# Patient Record
Sex: Male | Born: 1960 | Race: White | Hispanic: No | Marital: Married | Smoking: Former smoker
Health system: Southern US, Community
[De-identification: ages and names within clinical notes are randomized; demographics above are authoritative.]

## PROBLEM LIST (undated history)

## (undated) DIAGNOSIS — K2960 Other gastritis without bleeding: Secondary | ICD-10-CM

## (undated) DIAGNOSIS — I5022 Chronic systolic (congestive) heart failure: Secondary | ICD-10-CM

## (undated) DIAGNOSIS — D649 Anemia, unspecified: Secondary | ICD-10-CM

## (undated) DIAGNOSIS — E785 Hyperlipidemia, unspecified: Secondary | ICD-10-CM

## (undated) DIAGNOSIS — K2980 Duodenitis without bleeding: Secondary | ICD-10-CM

## (undated) DIAGNOSIS — K269 Duodenal ulcer, unspecified as acute or chronic, without hemorrhage or perforation: Secondary | ICD-10-CM

## (undated) DIAGNOSIS — I255 Ischemic cardiomyopathy: Secondary | ICD-10-CM

## (undated) DIAGNOSIS — I10 Essential (primary) hypertension: Secondary | ICD-10-CM

## (undated) DIAGNOSIS — I2510 Atherosclerotic heart disease of native coronary artery without angina pectoris: Secondary | ICD-10-CM

## (undated) HISTORY — DX: Other gastritis without bleeding: K29.60

## (undated) HISTORY — PX: PILONIDAL CYST EXCISION: SHX744

## (undated) HISTORY — DX: Chronic systolic (congestive) heart failure: I50.22

## (undated) HISTORY — DX: Duodenitis without bleeding: K29.80

## (undated) HISTORY — DX: Duodenal ulcer, unspecified as acute or chronic, without hemorrhage or perforation: K26.9

## (undated) HISTORY — DX: Anemia, unspecified: D64.9

---

## 2001-07-04 ENCOUNTER — Emergency Department (HOSPITAL_COMMUNITY): Admission: EM | Admit: 2001-07-04 | Discharge: 2001-07-04 | Payer: Self-pay | Admitting: Emergency Medicine

## 2001-07-04 ENCOUNTER — Encounter: Payer: Self-pay | Admitting: Emergency Medicine

## 2008-08-15 ENCOUNTER — Emergency Department (HOSPITAL_COMMUNITY): Admission: EM | Admit: 2008-08-15 | Discharge: 2008-08-16 | Payer: Self-pay | Admitting: Emergency Medicine

## 2008-08-21 ENCOUNTER — Emergency Department (HOSPITAL_COMMUNITY): Admission: EM | Admit: 2008-08-21 | Discharge: 2008-08-22 | Payer: Self-pay | Admitting: Emergency Medicine

## 2010-04-18 ENCOUNTER — Emergency Department (HOSPITAL_COMMUNITY)
Admission: EM | Admit: 2010-04-18 | Discharge: 2010-04-19 | Disposition: A | Discharge status: Home / Self Care | Payer: Self-pay | Attending: Emergency Medicine | Admitting: Emergency Medicine

## 2010-04-18 DIAGNOSIS — R002 Palpitations: Secondary | ICD-10-CM | POA: Insufficient documentation

## 2010-04-18 DIAGNOSIS — I498 Other specified cardiac arrhythmias: Secondary | ICD-10-CM | POA: Insufficient documentation

## 2010-04-18 LAB — POCT I-STAT, CHEM 8
BUN: 12 mg/dL (ref 6–23)
Calcium, Ion: 0.99 mmol/L — ABNORMAL LOW (ref 1.12–1.32)
Chloride: 106 meq/L (ref 96–112)
Creatinine, Ser: 1.1 mg/dL (ref 0.4–1.5)
Glucose, Bld: 175 mg/dL — ABNORMAL HIGH (ref 70–99)
HCT: 53 % — ABNORMAL HIGH (ref 39.0–52.0)
Hemoglobin: 18 g/dL — ABNORMAL HIGH (ref 13.0–17.0)
Potassium: 3.9 meq/L (ref 3.5–5.1)
Sodium: 138 meq/L (ref 135–145)
TCO2: 21 mmol/L (ref 0–100)

## 2010-04-19 LAB — T4, FREE: Free T4: 1.15 ng/dL (ref 0.80–1.80)

## 2010-04-19 LAB — TSH: TSH: 1.038 u[IU]/mL (ref 0.350–4.500)

## 2012-01-25 ENCOUNTER — Encounter (HOSPITAL_COMMUNITY): Payer: Self-pay | Admitting: *Deleted

## 2012-01-25 ENCOUNTER — Emergency Department (HOSPITAL_COMMUNITY): Payer: Self-pay

## 2012-01-25 ENCOUNTER — Emergency Department (HOSPITAL_COMMUNITY)
Admission: EM | Admit: 2012-01-25 | Discharge: 2012-01-26 | Disposition: A | Discharge status: Home / Self Care | Payer: Self-pay | Attending: Emergency Medicine | Admitting: Emergency Medicine

## 2012-01-25 DIAGNOSIS — R110 Nausea: Secondary | ICD-10-CM | POA: Insufficient documentation

## 2012-01-25 DIAGNOSIS — F17200 Nicotine dependence, unspecified, uncomplicated: Secondary | ICD-10-CM | POA: Insufficient documentation

## 2012-01-25 DIAGNOSIS — I10 Essential (primary) hypertension: Secondary | ICD-10-CM | POA: Insufficient documentation

## 2012-01-25 DIAGNOSIS — R0602 Shortness of breath: Secondary | ICD-10-CM | POA: Insufficient documentation

## 2012-01-25 DIAGNOSIS — R000 Tachycardia, unspecified: Secondary | ICD-10-CM | POA: Insufficient documentation

## 2012-01-25 HISTORY — DX: Essential (primary) hypertension: I10

## 2012-01-25 LAB — CBC WITH DIFFERENTIAL/PLATELET
Basophils Relative: 0 % (ref 0–1)
Eosinophils Absolute: 0.2 10*3/uL (ref 0.0–0.7)
HCT: 43.5 % (ref 39.0–52.0)
Hemoglobin: 14.6 g/dL (ref 13.0–17.0)
MCH: 27 pg (ref 26.0–34.0)
MCHC: 33.6 g/dL (ref 30.0–36.0)
MCV: 80.4 fL (ref 78.0–100.0)
Monocytes Absolute: 0.7 10*3/uL (ref 0.1–1.0)
Monocytes Relative: 6 % (ref 3–12)
Neutro Abs: 6.1 10*3/uL (ref 1.7–7.7)

## 2012-01-25 LAB — COMPREHENSIVE METABOLIC PANEL
Albumin: 3.8 g/dL (ref 3.5–5.2)
BUN: 17 mg/dL (ref 6–23)
Chloride: 98 mEq/L (ref 96–112)
Creatinine, Ser: 0.77 mg/dL (ref 0.50–1.35)
Total Bilirubin: 0.2 mg/dL — ABNORMAL LOW (ref 0.3–1.2)

## 2012-01-25 LAB — TROPONIN I: Troponin I: <0.3 ng/mL (ref <0.30)

## 2012-01-25 NOTE — ED Provider Notes (Signed)
History     CSN: [id number]  Arrival date & time 01/25/12  2132   First MD Initiated Contact with Patient 01/25/12 2327      Chief Complaint  Patient presents with  . Weakness    (Consider location/radiation/quality/duration/timing/severity/associated sxs/prior treatment) HPI This is a 52 year old male with a two-day history of generalized weakness. He states he felt weak yesterday and took his blood pressure at the drugstore, finding it to be 176/110. He drank some over-the-counter "natural" medicine a friend recommended. He rechecked his blood pressure today and found it to be 198/127, and he felt worse. The weakness is accompanied by intermittent shortness of breath, palpitations (by which he means a rapid heartbeat) and nausea but no chest pain, vomiting or diarrhea. He is not aware of having a fever but has had some mild chills. He was noted to be tachycardic on arrival and nursing staff reports he was hyperventilating. His tachypnea and tachycardia have subsequently resolved. His most recent blood pressure is 146/97, heart rate in the 80s.  Past Medical History  Diagnosis Date  . Hypertension     History reviewed. No pertinent past surgical history.  No family history on file.  History  Substance Use Topics  . Smoking status: Current Some Day Smoker  . Smokeless tobacco: Not on file  . Alcohol Use: No      Review of Systems  All other systems reviewed and are negative.    Allergies  Review of patient's allergies indicates no known allergies.  Home Medications  No current outpatient prescriptions on file.  BP 147/96  Pulse 78  Temp 97.8 F (36.6 C) (Oral)  Resp 19  SpO2 96%  Physical Exam General: Well-developed, well-nourished male in no acute distress; appearance consistent with age of record HENT: normocephalic, atraumatic Eyes: pupils equal round and reactive to light; extraocular muscles intact Neck: supple Heart: regular rate and rhythm Lungs:  clear to auscultation bilaterally Abdomen: soft; nondistended; nontender; no masses or hepatosplenomegaly; bowel sounds present Extremities: No deformity; full range of motion; pulses normal; no edema; no calf tenderness Neurologic: Awake, alert and oriented; motor function intact in all extremities and symmetric; no facial droop Skin: Warm and dry Psychiatric: Anxious    ED Course  Procedures (including critical care time)     MDM   Nursing notes and vitals signs, including pulse oximetry, reviewed.  Summary of this visit's results, reviewed by myself:  Labs:  Results for orders placed during the hospital encounter of 01/25/12 (from the past 24 hour(s))  CBC WITH DIFFERENTIAL     Status: Abnormal   Collection Time   01/25/12  9:54 PM      Component Value Range   WBC 12.1 (*) 4.0 - 10.5 K/uL   RBC 5.41  4.22 - 5.81 MIL/uL   Hemoglobin 14.6  13.0 - 17.0 g/dL   HCT 78.2  95.6 - 21.3 %   MCV 80.4  78.0 - 100.0 fL   MCH 27.0  26.0 - 34.0 pg   MCHC 33.6  30.0 - 36.0 g/dL   RDW 08.6  57.8 - 46.9 %   Platelets 233  150 - 400 K/uL   Neutrophils Relative 51  43 - 77 %   Neutro Abs 6.1  1.7 - 7.7 K/uL   Lymphocytes Relative 42  12 - 46 %   Lymphs Abs 5.0 (*) 0.7 - 4.0 K/uL   Monocytes Relative 6  3 - 12 %   Monocytes Absolute 0.7  0.1 -  1.0 K/uL   Eosinophils Relative 2  0 - 5 %   Eosinophils Absolute 0.2  0.0 - 0.7 K/uL   Basophils Relative 0  0 - 1 %   Basophils Absolute 0.0  0.0 - 0.1 K/uL  COMPREHENSIVE METABOLIC PANEL     Status: Abnormal   Collection Time   01/25/12  9:54 PM      Component Value Range   Sodium 131 (*) 135 - 145 mEq/L   Potassium 3.8  3.5 - 5.1 mEq/L   Chloride 98  96 - 112 mEq/L   CO2 21  19 - 32 mEq/L   Glucose, Bld 145 (*) 70 - 99 mg/dL   BUN 17  6 - 23 mg/dL   Creatinine, Ser 9.60  0.50 - 1.35 mg/dL   Calcium 9.2  8.4 - 45.4 mg/dL   Total Protein 7.9  6.0 - 8.3 g/dL   Albumin 3.8  3.5 - 5.2 g/dL   AST 19  0 - 37 U/L   ALT 15  0 - 53 U/L    Alkaline Phosphatase 97  39 - 117 U/L   Total Bilirubin 0.2 (*) 0.3 - 1.2 mg/dL   GFR calc non Af Amer >90  >90 mL/min   GFR calc Af Amer >90  >90 mL/min  TROPONIN I     Status: Normal   Collection Time   01/25/12  9:54 PM      Component Value Range   Troponin I <0.30  <0.30 ng/mL  TROPONIN I     Status: Normal   Collection Time   01/26/12 12:37 AM      Component Value Range   Troponin I <0.30  <0.30 ng/mL  URINALYSIS, ROUTINE W REFLEX MICROSCOPIC     Status: Abnormal   Collection Time   01/26/12  1:20 AM      Component Value Range   Color, Urine YELLOW  YELLOW   APPearance CLEAR  CLEAR   Specific Gravity, Urine 1.020  1.005 - 1.030   pH 5.0  5.0 - 8.0   Glucose, UA NEGATIVE  NEGATIVE mg/dL   Hgb urine dipstick LARGE (*) NEGATIVE   Bilirubin Urine NEGATIVE  NEGATIVE   Ketones, ur NEGATIVE  NEGATIVE mg/dL   Protein, ur NEGATIVE  NEGATIVE mg/dL   Urobilinogen, UA 0.2  0.0 - 1.0 mg/dL   Nitrite NEGATIVE  NEGATIVE   Leukocytes, UA NEGATIVE  NEGATIVE  URINE MICROSCOPIC-ADD ON     Status: Normal   Collection Time   01/26/12  1:20 AM      Component Value Range   Squamous Epithelial / LPF RARE  RARE   WBC, UA 0-2  <3 WBC/hpf   RBC / HPF 0-2  <3 RBC/hpf    Imaging Studies: Dg Chest 2 View  01/26/2012  *RADIOLOGY REPORT*  Clinical Data: High blood pressure and heart palpitations. Weakness.  CHEST - 2 VIEW  Comparison: None.  Findings: The slightly shallow inspiration. The heart size and pulmonary vascularity are normal. The lungs appear clear and expanded without focal air space disease or consolidation. No blunting of the costophrenic angles.  No pneumothorax.  Mediastinal contours appear intact.  IMPRESSION: No evidence of active pulmonary disease.   Original Report Authenticated By: Burman Nieves, M.D.     EKG Interpretation:  Date & Time: 01/25/2012 9:35 PM  Rate: 123  Rhythm: sinus tachycardia  QRS Axis: left  Intervals: normal  ST/T Wave abnormalities: normal  Conduction  Disutrbances:none  Narrative Interpretation:  Old EKG Reviewed: Fusion complex seen previously  2:07 AM Patient feels better at this time. He admits that he has been eating a lot of salt lately. He also states he has been stressed at work recently. Start on hydrochlorothiazide. He was encouraged to establish himself with a primary care physician.          Hanley Seamen, MD 01/26/12 [phone]

## 2012-01-25 NOTE — ED Notes (Signed)
Pt presents hyperventilating; states his bp is elevated; states started with chest pain yesterday; shortness of breath; nausea; c/o headache; states drank some medicine to make his bp come down and felt worse afterwards; heart rate 130 in triage

## 2012-01-26 LAB — URINALYSIS, ROUTINE W REFLEX MICROSCOPIC
Bilirubin Urine: NEGATIVE
Ketones, ur: NEGATIVE mg/dL
Nitrite: NEGATIVE
Protein, ur: NEGATIVE mg/dL
Specific Gravity, Urine: 1.02 (ref 1.005–1.030)
Urobilinogen, UA: 0.2 mg/dL (ref 0.0–1.0)

## 2012-01-26 LAB — TROPONIN I: Troponin I: <0.3 ng/mL (ref <0.30)

## 2012-01-26 MED ORDER — HYDROCHLOROTHIAZIDE 25 MG PO TABS: 25.0000 mg | ORAL_TABLET | Freq: Every day | ORAL | Status: DC

## 2012-10-25 ENCOUNTER — Encounter (HOSPITAL_COMMUNITY): Payer: Self-pay | Admitting: Emergency Medicine

## 2012-10-25 ENCOUNTER — Emergency Department (INDEPENDENT_AMBULATORY_CARE_PROVIDER_SITE_OTHER)
Admission: EM | Admit: 2012-10-25 | Discharge: 2012-10-25 | Disposition: A | Discharge status: Home / Self Care | Payer: Self-pay | Source: Home / Self Care

## 2012-10-25 DIAGNOSIS — Z9119 Patient's noncompliance with other medical treatment and regimen: Secondary | ICD-10-CM

## 2012-10-25 DIAGNOSIS — I10 Essential (primary) hypertension: Secondary | ICD-10-CM

## 2012-10-25 DIAGNOSIS — Z91199 Patient's noncompliance with other medical treatment and regimen due to unspecified reason: Secondary | ICD-10-CM

## 2012-10-25 MED ORDER — HYDROCHLOROTHIAZIDE 25 MG PO TABS: 25.0000 mg | ORAL_TABLET | Freq: Every day | ORAL | Status: DC

## 2012-10-25 NOTE — ED Provider Notes (Signed)
CSN: [id number]     Arrival date & time 10/25/12  1148 History   First MD Initiated Contact with Patient 10/25/12 1242     Chief Complaint  Patient presents with  . Hypertension    hx of HBP.  has not taken meds in over a year. c/o headache.    (Consider location/radiation/quality/duration/timing/severity/associated sxs/prior Treatment) HPI Comments: 52 year old [place] man comes to the urgent care for refill on his antihypertensive medication. He was seen approximately one year ago at the emergency department and was treated with HCTZ. He was told to followup with a PCP but he states that he did not look for one because he was too busy.Marland Kitchen He does not know what the results of taking a blood pressure medicine was. When he ran out he started taking a friend's blood pressure medicine, metoprolol. He states when he stopped taking the medicine he feels tense and has a headache. While taking the medicine he feels better he does not know his blood pressure is.   Past Medical History  Diagnosis Date  . Hypertension    History reviewed. No pertinent past surgical history. History reviewed. No pertinent family history. History  Substance Use Topics  . Smoking status: Current Some Day Smoker  . Smokeless tobacco: Not on file  . Alcohol Use: No    Review of Systems  Constitutional: Negative.   HENT: Negative.   Respiratory: Positive for shortness of breath.   Cardiovascular: Negative.   Gastrointestinal: Negative.   Genitourinary: Negative.   Skin: Negative.   Neurological: Negative.     Allergies  Review of patient's allergies indicates no known allergies.  Home Medications   Current Outpatient Rx  Name  Route  Sig  Dispense  Refill  . hydrochlorothiazide (HYDRODIURIL) 25 MG tablet   Oral   Take 1 tablet (25 mg total) by mouth daily.   30 tablet   0   . hydrochlorothiazide (HYDRODIURIL) 25 MG tablet   Oral   Take 1 tablet (25 mg total) by mouth daily.   30 tablet    0    BP 168/98  Pulse 77  Temp(Src) 97.9 F (36.6 C) (Oral)  Resp 14  SpO2 100% Physical Exam  Nursing note and vitals reviewed. Constitutional: He is oriented to person, place, and time. He appears well-developed and well-nourished. No distress.  Eyes: Conjunctivae and EOM are normal. Pupils are equal, round, and reactive to light.  Neck: Normal range of motion.  Cardiovascular: Normal rate, regular rhythm, normal heart sounds and intact distal pulses.   No murmur heard. Pulmonary/Chest: Effort normal and breath sounds normal. No respiratory distress. He has no wheezes.  Musculoskeletal: He exhibits no edema.  Lymphadenopathy:    He has no cervical adenopathy.  Neurological: He is alert and oriented to person, place, and time. He exhibits normal muscle tone.  Skin: Skin is warm and dry.  Psychiatric: He has a normal mood and affect.    ED Course  Procedures (including critical care time) Labs Review Labs Reviewed - No data to display Imaging Review No results found.    MDM   1. HTN (hypertension)   2. Personal history of noncompliance with medical treatment      HCTZ 25 mg q d. Must follow with a PCP for chronic care and BP management. The Urgent care is not equipped to manage optimal chronic medical illnesses. Phone numbers given.   Hayden Rasmussen, NP 10/25/12 1402

## 2012-10-25 NOTE — ED Notes (Signed)
C/o headache. Hx of hypertension. Pt states that he has not taken med in over a year. Denies any visual changes.

## 2012-10-25 NOTE — ED Provider Notes (Signed)
Medical screening examination/treatment/procedure(s) were performed by non-physician practitioner and as supervising physician I was immediately available for consultation/collaboration.  Jakobee Brackins, M.D.  Zacari Stiff C Estelita Iten, MD 10/25/12 1734 

## 2012-11-29 ENCOUNTER — Encounter: Payer: Self-pay | Admitting: Internal Medicine

## 2012-11-29 ENCOUNTER — Ambulatory Visit: Payer: No Typology Code available for payment source | Attending: Internal Medicine | Admitting: Internal Medicine

## 2012-11-29 VITALS — BP 164/104 | HR 76 | Temp 98.1°F | Ht 67.0 in | Wt 199.2 lb

## 2012-11-29 DIAGNOSIS — I10 Essential (primary) hypertension: Secondary | ICD-10-CM | POA: Insufficient documentation

## 2012-11-29 LAB — CBC WITH DIFFERENTIAL/PLATELET
Basophils Absolute: 0 10*3/uL (ref 0.0–0.1)
Eosinophils Absolute: 0.1 10*3/uL (ref 0.0–0.7)
Lymphocytes Relative: 37 % (ref 12–46)
Lymphs Abs: 3 10*3/uL (ref 0.7–4.0)
Neutrophils Relative %: 55 % (ref 43–77)
Platelets: 240 10*3/uL (ref 150–400)
RBC: 5.47 MIL/uL (ref 4.22–5.81)
RDW: 14.2 % (ref 11.5–15.5)
WBC: 8 10*3/uL (ref 4.0–10.5)

## 2012-11-29 LAB — COMPLETE METABOLIC PANEL WITH GFR
BUN: 15 mg/dL (ref 6–23)
CO2: 30 mEq/L (ref 19–32)
Calcium: 9.4 mg/dL (ref 8.4–10.5)
Chloride: 101 mEq/L (ref 96–112)
Creat: 0.98 mg/dL (ref 0.50–1.35)
GFR, Est African American: >89 mL/min
Glucose, Bld: 115 mg/dL — ABNORMAL HIGH (ref 70–99)

## 2012-11-29 LAB — LIPID PANEL
HDL: 30 mg/dL — ABNORMAL LOW (ref >39)
Total CHOL/HDL Ratio: 7 Ratio
Triglycerides: 203 mg/dL — ABNORMAL HIGH (ref <150)

## 2012-11-29 MED ORDER — LISINOPRIL-HYDROCHLOROTHIAZIDE 20-25 MG PO TABS: 1.0000 | ORAL_TABLET | Freq: Every day | ORAL | Status: DC

## 2012-11-29 NOTE — Patient Instructions (Signed)

## 2012-11-29 NOTE — Progress Notes (Signed)
Patient ID: [name], male   DOB: October 19, 1960, 52 y.o.   MRN: [id number] Patient Demographics  Jason Foster, is a 52 y.o. male  [id number]  [id number]  DOB - [birth year]  CC:  Chief Complaint  Patient presents with  . Establish Care  . Hypertension       HPI: Jason Foster is a 53 y.o. male here today to establish medical care. Patient is known to have hypertension, started on hydrochlorothiazide 25 mg tablet daily, patient has been inconsistent with taking medication, he claims he takes it when he has headache. He has no other medical history. He is a Runner, broadcasting/film/video. He quit smoking last year after a long term smoking history, he does not drink alcohol. Patient has No headache, No chest pain, No abdominal pain - No Nausea, No new weakness tingling or numbness, No Cough - SOB.  No Known Allergies Past Medical History  Diagnosis Date  . Hypertension    Current Outpatient Prescriptions on File Prior to Visit  Medication Sig Dispense Refill  . hydrochlorothiazide (HYDRODIURIL) 25 MG tablet Take 1 tablet (25 mg total) by mouth daily.  30 tablet  0  . hydrochlorothiazide (HYDRODIURIL) 25 MG tablet Take 1 tablet (25 mg total) by mouth daily.  30 tablet  0   No current facility-administered medications on file prior to visit.   No family history on file. History   Social History  . Marital Status: Single    Spouse Name: N/A    Number of Children: N/A  . Years of Education: N/A   Occupational History  . Not on file.   Social History Main Topics  . Smoking status: Current Some Day Smoker  . Smokeless tobacco: Former Neurosurgeon    Quit date: 11/30/2011  . Alcohol Use: No  . Drug Use: No  . Sexual Activity: Yes   Other Topics Concern  . Not on file   Social History Narrative  . No narrative on file    Review of Systems: Constitutional: Negative for fever, chills, diaphoresis, activity change, appetite change and fatigue. HENT: Negative for ear pain, nosebleeds,  congestion, facial swelling, rhinorrhea, neck pain, neck stiffness and ear discharge.  Eyes: Negative for pain, discharge, redness, itching and visual disturbance. Respiratory: Negative for cough, choking, chest tightness, shortness of breath, wheezing and stridor.  Cardiovascular: Negative for chest pain, palpitations and leg swelling. Gastrointestinal: Negative for abdominal distention. Genitourinary: Negative for dysuria, urgency, frequency, hematuria, flank pain, decreased urine volume, difficulty urinating and dyspareunia.  Musculoskeletal: Negative for back pain, joint swelling, arthralgia and gait problem. Neurological: Negative for dizziness, tremors, seizures, syncope, facial asymmetry, speech difficulty, weakness, light-headedness, numbness and headaches.  Hematological: Negative for adenopathy. Does not bruise/bleed easily. Psychiatric/Behavioral: Negative for hallucinations, behavioral problems, confusion, dysphoric mood, decreased concentration and agitation.    Objective:   Filed Vitals:   11/29/12 1246  BP: 164/104  Pulse: 76  Temp: 98.1 F (36.7 C)    Physical Exam: Constitutional: Patient appears well-developed and well-nourished. No distress. HENT: Normocephalic, atraumatic, External right and left ear normal. Oropharynx is clear and moist.  Eyes: Conjunctivae and EOM are normal. PERRLA, no scleral icterus. Neck: Normal ROM. Neck supple. No JVD. No tracheal deviation. No thyromegaly. CVS: RRR, S1/S2 +, no murmurs, no gallops, no carotid bruit.  Pulmonary: Effort and breath sounds normal, no stridor, rhonchi, wheezes, rales.  Abdominal: Soft. BS +, no distension, tenderness, rebound or guarding.  Musculoskeletal: Normal range of motion. No edema and no tenderness.  Lymphadenopathy:  No lymphadenopathy noted, cervical, inguinal or axillary Neuro: Alert. Normal reflexes, muscle tone coordination. No cranial nerve deficit. Skin: Skin is warm and dry. No rash noted. Not  diaphoretic. No erythema. No pallor. Psychiatric: Normal mood and affect. Behavior, judgment, thought content normal.  Lab Results  Component Value Date   WBC 12.1* 01/25/2012   HGB 14.6 01/25/2012   HCT 43.5 01/25/2012   MCV 80.4 01/25/2012   PLT 233 01/25/2012   Lab Results  Component Value Date   CREATININE 0.77 01/25/2012   BUN 17 01/25/2012   NA 131* 01/25/2012   K 3.8 01/25/2012   CL 98 01/25/2012   CO2 21 01/25/2012    No results found for this basename: HGBA1C   Lipid Panel  No results found for this basename: chol, trig, hdl, cholhdl, vldl, ldlcalc       Assessment and plan:   Patient Active Problem List   Diagnosis Date Noted  . Essential hypertension, benign 11/29/2012    Plan: Lisinopril-hydrochlorothiazide 20-25 mg tablet by mouth daily DASH diet  Patient counseled extensively about nutrition and exercise Patient counseled extensively about smoking cessation  Lab:  Comprehensive metabolic panel  Complete blood count and differentials  Thyroid function test  Lipid panel  Complete urinalysis     Follow up in 4 weeks or when necessary  The patient was given clear instructions to go to ER or return to medical center if symptoms don't improve, worsen or new problems develop. The patient verbalized understanding. The patient was told to call to get lab results if they haven't heard anything in the next week.     Jeanann Lewandowsky, MD, MHA, FACP, FAAP [facility] [place], [place] [phone]   11/29/2012, 1:04 PM

## 2012-11-29 NOTE — Progress Notes (Signed)
Pt is here to establish care. Pt has hypertension and requests testing for cholesterol and glucose reading.

## 2012-11-30 LAB — URINALYSIS, COMPLETE
Bacteria, UA: NONE SEEN
Crystals: NONE SEEN
Ketones, ur: NEGATIVE mg/dL
Nitrite: NEGATIVE
Protein, ur: NEGATIVE mg/dL
Specific Gravity, Urine: 1.022 (ref 1.005–1.030)
Urobilinogen, UA: 0.2 mg/dL (ref 0.0–1.0)

## 2012-11-30 LAB — TSH: TSH: 0.652 u[IU]/mL (ref 0.350–4.500)

## 2012-12-29 ENCOUNTER — Ambulatory Visit: Payer: Self-pay

## 2012-12-30 ENCOUNTER — Encounter: Payer: Self-pay | Admitting: Internal Medicine

## 2012-12-30 ENCOUNTER — Ambulatory Visit: Payer: No Typology Code available for payment source | Attending: Internal Medicine | Admitting: Internal Medicine

## 2012-12-30 VITALS — BP 137/86 | HR 81 | Temp 98.8°F | Resp 16 | Ht 67.0 in | Wt 207.0 lb

## 2012-12-30 DIAGNOSIS — I10 Essential (primary) hypertension: Secondary | ICD-10-CM | POA: Insufficient documentation

## 2012-12-30 DIAGNOSIS — E785 Hyperlipidemia, unspecified: Secondary | ICD-10-CM

## 2012-12-30 MED ORDER — ATORVASTATIN CALCIUM 20 MG PO TABS: 20.0000 mg | ORAL_TABLET | Freq: Every day | ORAL | Status: DC

## 2012-12-30 NOTE — Progress Notes (Signed)
Patient ID: Jason Foster, male   DOB: 1960-12-13, 52 y.o.   MRN: [id number] Patient Demographics  Jason Foster, is a 52 y.o. male  [id number]  [id number]  DOB - Nov 27, 1960  Chief Complaint  Patient presents with  . Follow-up        Subjective:   Jason Foster is a 52 y.o. male here today for a follow up visit. Patient wants to review the lab results for cholesterol. He was recently started on lisinopril-hydrochlorothiazide for hypertension. Blood pressure is controlled he is tolerating medication well. No new complaints today. He continue to smoke cigarette but at a reduced rate. Patient has No headache, No chest pain, No abdominal pain - No Nausea, No new weakness tingling or numbness, No Cough - SOB.  ALLERGIES: No Known Allergies  PAST MEDICAL HISTORY: Past Medical History  Diagnosis Date  . Hypertension     MEDICATIONS AT HOME: Prior to Admission medications   Medication Sig Start Date End Date Taking? Authorizing Provider  lisinopril-hydrochlorothiazide (PRINZIDE,ZESTORETIC) 20-25 MG per tablet Take 1 tablet by mouth daily. 11/29/12  Yes Jeanann Lewandowsky, MD  atorvastatin (LIPITOR) 20 MG tablet Take 1 tablet (20 mg total) by mouth daily. 12/30/12   Jeanann Lewandowsky, MD     Objective:   Filed Vitals:   12/30/12 1232  BP: 137/86  Pulse: 81  Temp: 98.8 F (37.1 C)  TempSrc: Oral  Resp: 16  Height: 5\' 7"  (1.702 m)  Weight: 207 lb (93.895 kg)  SpO2: 97%    Exam General appearance : Awake, alert, not in any distress. Speech Clear. Not toxic looking HEENT: Atraumatic and Normocephalic, pupils equally reactive to light and accomodation Neck: supple, no JVD. No cervical lymphadenopathy.  Chest:Good air entry bilaterally, no added sounds  CVS: S1 S2 regular, no murmurs.  Abdomen: Bowel sounds present, Non tender and not distended with no gaurding, rigidity or rebound. Extremities: B/L Lower Ext shows no edema, both legs are warm to touch Neurology:  Awake alert, and oriented X 3, CN II-XII intact, Non focal Skin:No Rash Wounds:N/A   Data Review   CBC No results found for this basename: WBC, HGB, HCT, PLT, MCV, MCH, MCHC, RDW, NEUTRABS, LYMPHSABS, MONOABS, EOSABS, BASOSABS, BANDABS, BANDSABD,  in the last 168 hours  Chemistries   No results found for this basename: NA, K, CL, CO2, GLUCOSE, BUN, CREATININE, GFRCGP, CALCIUM, MG, AST, ALT, ALKPHOS, BILITOT,  in the last 168 hours ------------------------------------------------------------------------------------------------------------------ No results found for this basename: HGBA1C,  in the last 72 hours ------------------------------------------------------------------------------------------------------------------ No results found for this basename: CHOL, HDL, LDLCALC, TRIG, CHOLHDL, LDLDIRECT,  in the last 72 hours ------------------------------------------------------------------------------------------------------------------ No results found for this basename: TSH, T4TOTAL, FREET3, T3FREE, THYROIDAB,  in the last 72 hours ------------------------------------------------------------------------------------------------------------------ No results found for this basename: VITAMINB12, FOLATE, FERRITIN, TIBC, IRON, RETICCTPCT,  in the last 72 hours  Coagulation profile  No results found for this basename: INR, PROTIME,  in the last 168 hours    Assessment & Plan   1. Essential hypertension, benign Continue lisinopril-hydrochlorothiazide 20-25 mg tablet by mouth daily  2. Dyslipidemia Start - atorvastatin (LIPITOR) 20 MG tablet; Take 1 tablet (20 mg total) by mouth daily.  Dispense: 90 tablet; Refill: 3 Repeat labs in 3 months  Patient has been extensively counseled on nutrition and exercise Patient counseled on smoking cessation  Follow up in 3 months or when necessary   The patient was given clear instructions to go to ER or return to medical center if symptoms  don't  improve, worsen or new problems develop. The patient verbalized understanding. The patient was told to call to get lab results if they haven't heard anything in the next week.    Jeanann Lewandowsky, MD, MHA, FACP, FAAP [facility] [place], [place] [phone]   12/30/2012, 12:55 PM

## 2012-12-30 NOTE — Progress Notes (Signed)
Pt is here following up on his HJTN. Pt is requesting to review his lab result.

## 2012-12-30 NOTE — Patient Instructions (Addendum)
Hypertension As your heart beats, it forces blood through your arteries. This force is your blood pressure. If the pressure is too high, it is called hypertension (HTN) or high blood pressure. HTN is dangerous because you may have it and not know it. High blood pressure may mean that your heart has to work harder to pump blood. Your arteries may be narrow or stiff. The extra work puts you at risk for heart disease, stroke, and other problems.  Blood pressure consists of two numbers, a higher number over a lower, 110/72, for example. It is stated as "110 over 72." The ideal is below 120 for the top number (systolic) and under 80 for the bottom (diastolic). Write down your blood pressure today. You should pay close attention to your blood pressure if you have certain conditions such as:  Heart failure.  Prior heart attack.  Diabetes  Chronic kidney disease.  Prior stroke.  Multiple risk factors for heart disease. To see if you have HTN, your blood pressure should be measured while you are seated with your arm held at the level of the heart. It should be measured at least twice. A one-time elevated blood pressure reading (especially in the Emergency Department) does not mean that you need treatment. There may be conditions in which the blood pressure is different between your right and left arms. It is important to see your caregiver soon for a recheck. Most people have essential hypertension which means that there is not a specific cause. This type of high blood pressure may be lowered by changing lifestyle factors such as:  Stress.  Smoking.  Lack of exercise.  Excessive weight.  Drug/tobacco/alcohol use.  Eating less salt. Most people do not have symptoms from high blood pressure until it has caused damage to the body. Effective treatment can often prevent, delay or reduce that damage. TREATMENT  When a cause has been identified, treatment for high blood pressure is directed at the  cause. There are a large number of medications to treat HTN. These fall into several categories, and your caregiver will help you select the medicines that are best for you. Medications may have side effects. You should review side effects with your caregiver. If your blood pressure stays high after you have made lifestyle changes or started on medicines,   Your medication(s) may need to be changed.  Other problems may need to be addressed.  Be certain you understand your prescriptions, and know how and when to take your medicine.  Be sure to follow up with your caregiver within the time frame advised (usually within two weeks) to have your blood pressure rechecked and to review your medications.  If you are taking more than one medicine to lower your blood pressure, make sure you know how and at what times they should be taken. Taking two medicines at the same time can result in blood pressure that is too low. SEEK IMMEDIATE MEDICAL CARE IF:  You develop a severe headache, blurred or changing vision, or confusion.  You have unusual weakness or numbness, or a faint feeling.  You have severe chest or abdominal pain, vomiting, or breathing problems. MAKE SURE YOU:   Understand these instructions.  Will watch your condition.  Will get help right away if you are not doing well or get worse. Document Released: 12/30/2004 Document Revised: 03/24/2011 Document Reviewed: 08/20/2007 [facility] Patient Information 2014 [place]. DASH Diet The DASH diet stands for "Dietary Approaches to Stop Hypertension." It is a healthy  eating plan that has been shown to reduce high blood pressure (hypertension) in as little as 14 days, while also possibly providing other significant health benefits. These other health benefits include reducing the risk of breast cancer after menopause and reducing the risk of type 2 diabetes, heart disease, colon cancer, and stroke. Health benefits also include weight loss  and slowing kidney failure in patients with chronic kidney disease.  DIET GUIDELINES  Limit salt (sodium). Your diet should contain less than 1500 mg of sodium daily.  Limit refined or processed carbohydrates. Your diet should include mostly whole grains. Desserts and added sugars should be used sparingly.  Include small amounts of heart-healthy fats. These types of fats include nuts, oils, and tub margarine. Limit saturated and trans fats. These fats have been shown to be harmful in the body. CHOOSING FOODS  The following food groups are based on a 2000 calorie diet. See your Registered Dietitian for individual calorie needs. Grains and Grain Products (6 to 8 servings daily)  Eat More Often: Whole-wheat bread, brown rice, whole-grain or wheat pasta, quinoa, popcorn without added fat or salt (air popped).  Eat Less Often: White bread, white pasta, white rice, cornbread. Vegetables (4 to 5 servings daily)  Eat More Often: Fresh, frozen, and canned vegetables. Vegetables may be raw, steamed, roasted, or grilled with a minimal amount of fat.  Eat Less Often/Avoid: Creamed or fried vegetables. Vegetables in a cheese sauce. Fruit (4 to 5 servings daily)  Eat More Often: All fresh, canned (in natural juice), or frozen fruits. Dried fruits without added sugar. One hundred percent fruit juice ( cup [237 mL] daily).  Eat Less Often: Dried fruits with added sugar. Canned fruit in light or heavy syrup. [employer], Fish, and Poultry (2 servings or less daily. One serving is 3 to 4 oz [85-114 g]).  Eat More Often: Ninety percent or leaner ground beef, tenderloin, sirloin. Round cuts of beef, chicken breast, [place] breast. All fish. Grill, bake, or broil your meat. Nothing should be fried.  Eat Less Often/Avoid: Fatty cuts of meat, [place], or chicken leg, thigh, or wing. Fried cuts of meat or fish. Dairy (2 to 3 servings)  Eat More Often: Low-fat or fat-free milk, low-fat plain or light yogurt,  reduced-fat or part-skim cheese.  Eat Less Often/Avoid: Milk (whole, 2%).Whole milk yogurt. Full-fat cheeses. Nuts, Seeds, and Legumes (4 to 5 servings per week)  Eat More Often: All without added salt.  Eat Less Often/Avoid: Salted nuts and seeds, canned beans with added salt. Fats and Sweets (limited)  Eat More Often: Vegetable oils, tub margarines without trans fats, sugar-free gelatin. Mayonnaise and salad dressings.  Eat Less Often/Avoid: Coconut oils, palm oils, butter, stick margarine, cream, half and half, cookies, candy, pie. FOR MORE INFORMATION The Dash Diet Eating Plan: www.dashdiet.org Document Released: 12/19/2010 Document Revised: 03/24/2011 Document Reviewed: 12/19/2010 [facility] Patient Information 2014 [place], [place]. Atorvastatin tablets What is this medicine? ATORVASTATIN (a TORE va sta tin) is known as a HMG-CoA reductase inhibitor or 'statin'. It lowers the level of cholesterol and triglycerides in the blood. This drug may also reduce the risk of heart attack, stroke, or other health problems in patients with risk factors for heart disease. Diet and lifestyle changes are often used with this drug. This medicine may be used for other purposes; ask your health care provider or pharmacist if you have questions. COMMON BRAND NAME(S): Lipitor What should I tell my health care provider before I take this medicine? They need  to know if you have any of these conditions: -frequently drink alcoholic beverages -history of stroke, TIA -kidney disease -liver disease -muscle aches or weakness -other medical condition -an unusual or allergic reaction to atorvastatin, other medicines, foods, dyes, or preservatives -pregnant or trying to get pregnant -breast-feeding How should I use this medicine? Take this medicine by mouth with a glass of water. Follow the directions on the prescription label. You can take this medicine with or without food. Take your doses at regular  intervals. Do not take your medicine more often than directed. Talk to your pediatrician regarding the use of this medicine in children. While this drug may be prescribed for children as young as 42 years old for selected conditions, precautions do apply. Overdosage: If you think you have taken too much of this medicine contact a poison control center or emergency room at once. NOTE: This medicine is only for you. Do not share this medicine with others. What if I miss a dose? If you miss a dose, take it as soon as you can. If it is almost time for your next dose, take only that dose. Do not take double or extra doses. What may interact with this medicine? Do not take this medicine with any of the following medications: -red yeast rice -telaprevir -telithromycin -voriconazole This medicine may also interact with the following medications: -alcohol -antiviral medicines for HIV or AIDS -boceprevir -certain antibiotics like clarithromycin, erythromycin, troleandomycin -certain medicines for cholesterol like fenofibrate or gemfibrozil -cimetidine -clarithromycin -colchicine -cyclosporine -digoxin -male hormones, like estrogens or progestins and birth control pills -grapefruit juice -medicines for fungal infections like fluconazole, itraconazole, ketoconazole -niacin -rifampin -spironolactone This list may not describe all possible interactions. Give your health care provider a list of all the medicines, herbs, non-prescription drugs, or dietary supplements you use. Also tell them if you smoke, drink alcohol, or use illegal drugs. Some items may interact with your medicine. What should I watch for while using this medicine? Visit your doctor or health care professional for regular check-ups. You may need regular tests to make sure your liver is working properly. Tell your doctor or health care professional right away if you get any unexplained muscle pain, tenderness, or weakness,  especially if you also have a fever and tiredness. Your doctor or health care professional may tell you to stop taking this medicine if you develop muscle problems. If your muscle problems do not go away after stopping this medicine, contact your health care professional. This drug is only part of a total heart-health program. Your doctor or a dietician can suggest a low-cholesterol and low-fat diet to help. Avoid alcohol and smoking, and keep a proper exercise schedule. Do not use this drug if you are pregnant or breast-feeding. Serious side effects to an unborn child or to an infant are possible. Talk to your doctor or pharmacist for more information. This medicine may affect blood sugar levels. If you have diabetes, check with your doctor or health care professional before you change your diet or the dose of your diabetic medicine. If you are going to have surgery tell your health care professional that you are taking this drug. What side effects may I notice from receiving this medicine? Side effects that you should report to your doctor or health care professional as soon as possible: -allergic reactions like skin rash, itching or hives, swelling of the face, lips, or tongue -dark urine -fever -joint pain -muscle cramps, pain -redness, blistering, peeling or loosening of the  skin, including inside the mouth -trouble passing urine or change in the amount of urine -unusually weak or tired -yellowing of eyes or skin Side effects that usually do not require medical attention (report to your doctor or health care professional if they continue or are bothersome): -constipation -heartburn -stomach gas, pain, upset This list may not describe all possible side effects. Call your doctor for medical advice about side effects. You may report side effects to FDA at 1-800-FDA-1088. Where should I keep my medicine? Keep out of the reach of children. Store at room temperature between 20 to 25 degrees C  (68 to 77 degrees F). Throw away any unused medicine after the expiration date. NOTE: This sheet is a summary. It may not cover all possible information. If you have questions about this medicine, talk to your doctor, pharmacist, or health care provider.  2014, Elsevier/Gold Standard. (2010-11-19 16:10:96)

## 2013-01-13 HISTORY — PX: CORONARY ANGIOPLASTY WITH STENT PLACEMENT: SHX49

## 2013-02-10 ENCOUNTER — Inpatient Hospital Stay (HOSPITAL_COMMUNITY)
Admission: EM | Admit: 2013-02-10 | Discharge: 2013-02-12 | DRG: 247 | Disposition: A | Discharge status: Home / Self Care | Payer: No Typology Code available for payment source | Attending: Cardiology | Admitting: Cardiology

## 2013-02-10 ENCOUNTER — Encounter (HOSPITAL_COMMUNITY): Payer: Self-pay | Admitting: Emergency Medicine

## 2013-02-10 ENCOUNTER — Encounter (HOSPITAL_COMMUNITY)
Admission: EM | Disposition: A | Discharge status: Home / Self Care | Payer: No Typology Code available for payment source | Source: Home / Self Care | Attending: Cardiology

## 2013-02-10 DIAGNOSIS — I219 Acute myocardial infarction, unspecified: Secondary | ICD-10-CM

## 2013-02-10 DIAGNOSIS — I255 Ischemic cardiomyopathy: Secondary | ICD-10-CM

## 2013-02-10 DIAGNOSIS — I2510 Atherosclerotic heart disease of native coronary artery without angina pectoris: Secondary | ICD-10-CM

## 2013-02-10 DIAGNOSIS — I213 ST elevation (STEMI) myocardial infarction of unspecified site: Secondary | ICD-10-CM

## 2013-02-10 DIAGNOSIS — I2109 ST elevation (STEMI) myocardial infarction involving other coronary artery of anterior wall: Principal | ICD-10-CM

## 2013-02-10 DIAGNOSIS — Z955 Presence of coronary angioplasty implant and graft: Secondary | ICD-10-CM

## 2013-02-10 DIAGNOSIS — I10 Essential (primary) hypertension: Secondary | ICD-10-CM

## 2013-02-10 DIAGNOSIS — E785 Hyperlipidemia, unspecified: Secondary | ICD-10-CM

## 2013-02-10 DIAGNOSIS — F17200 Nicotine dependence, unspecified, uncomplicated: Secondary | ICD-10-CM | POA: Diagnosis present

## 2013-02-10 DIAGNOSIS — I2589 Other forms of chronic ischemic heart disease: Secondary | ICD-10-CM | POA: Diagnosis present

## 2013-02-10 DIAGNOSIS — I2582 Chronic total occlusion of coronary artery: Secondary | ICD-10-CM | POA: Diagnosis present

## 2013-02-10 DIAGNOSIS — I517 Cardiomegaly: Secondary | ICD-10-CM

## 2013-02-10 DIAGNOSIS — Z7901 Long term (current) use of anticoagulants: Secondary | ICD-10-CM

## 2013-02-10 DIAGNOSIS — Z7902 Long term (current) use of antithrombotics/antiplatelets: Secondary | ICD-10-CM

## 2013-02-10 HISTORY — PX: PERCUTANEOUS CORONARY STENT INTERVENTION (PCI-S): SHX5485

## 2013-02-10 HISTORY — DX: Ischemic cardiomyopathy: I25.5

## 2013-02-10 HISTORY — PX: CORONARY ANGIOGRAM: SHX5466

## 2013-02-10 HISTORY — DX: Hyperlipidemia, unspecified: E78.5

## 2013-02-10 HISTORY — DX: Atherosclerotic heart disease of native coronary artery without angina pectoris: I25.10

## 2013-02-10 LAB — POCT I-STAT, CHEM 8
BUN: 18 mg/dL (ref 6–23)
CHLORIDE: 104 meq/L (ref 96–112)
Calcium, Ion: 1.14 mmol/L (ref 1.12–1.23)
Creatinine, Ser: 0.9 mg/dL (ref 0.50–1.35)
Glucose, Bld: 167 mg/dL — ABNORMAL HIGH (ref 70–99)
HEMATOCRIT: 44 % (ref 39.0–52.0)
Hemoglobin: 15 g/dL (ref 13.0–17.0)
Potassium: 3.8 mEq/L (ref 3.7–5.3)
Sodium: 138 mEq/L (ref 137–147)
TCO2: 19 mmol/L (ref 0–100)

## 2013-02-10 LAB — MRSA PCR SCREENING: MRSA by PCR: NEGATIVE

## 2013-02-10 LAB — CBC
HCT: 46 % (ref 39.0–52.0)
HEMATOCRIT: 40.5 % (ref 39.0–52.0)
HEMOGLOBIN: 15.3 g/dL (ref 13.0–17.0)
Hemoglobin: 13.8 g/dL (ref 13.0–17.0)
MCH: 26.4 pg (ref 26.0–34.0)
MCH: 26.8 pg (ref 26.0–34.0)
MCHC: 33.3 g/dL (ref 30.0–36.0)
MCHC: 34.1 g/dL (ref 30.0–36.0)
MCV: 79.4 fL (ref 78.0–100.0)
MCV: 78.8 fL (ref 78.0–100.0)
PLATELETS: 206 10*3/uL (ref 150–400)
Platelets: 251 10*3/uL (ref 150–400)
RBC: 5.79 MIL/uL (ref 4.22–5.81)
RBC: 5.14 MIL/uL (ref 4.22–5.81)
RDW: 14.7 % (ref 11.5–15.5)
RDW: 14.5 % (ref 11.5–15.5)
WBC: 15.7 10*3/uL — ABNORMAL HIGH (ref 4.0–10.5)
WBC: 13.1 10*3/uL — AB (ref 4.0–10.5)

## 2013-02-10 LAB — HEMOGLOBIN A1C
Hgb A1c MFr Bld: 6.1 % — ABNORMAL HIGH (ref <5.7)
MEAN PLASMA GLUCOSE: 128 mg/dL — AB (ref <117)

## 2013-02-10 LAB — COMPREHENSIVE METABOLIC PANEL
ALK PHOS: 95 U/L (ref 39–117)
ALT: 26 U/L (ref 0–53)
AST: 50 U/L — ABNORMAL HIGH (ref 0–37)
Albumin: 3.9 g/dL (ref 3.5–5.2)
BUN: 19 mg/dL (ref 6–23)
CO2: 21 meq/L (ref 19–32)
Calcium: 8.9 mg/dL (ref 8.4–10.5)
Chloride: 96 mEq/L (ref 96–112)
Creatinine, Ser: 0.91 mg/dL (ref 0.50–1.35)
GLUCOSE: 137 mg/dL — AB (ref 70–99)
POTASSIUM: 6.9 meq/L — AB (ref 3.7–5.3)
Sodium: 132 mEq/L — ABNORMAL LOW (ref 137–147)
Total Bilirubin: 0.3 mg/dL (ref 0.3–1.2)
Total Protein: 8.9 g/dL — ABNORMAL HIGH (ref 6.0–8.3)

## 2013-02-10 LAB — BASIC METABOLIC PANEL
BUN: 15 mg/dL (ref 6–23)
CO2: 23 meq/L (ref 19–32)
Calcium: 8.4 mg/dL (ref 8.4–10.5)
Chloride: 99 mEq/L (ref 96–112)
Creatinine, Ser: 0.88 mg/dL (ref 0.50–1.35)
GFR calc Af Amer: >90 mL/min (ref >90)
GLUCOSE: 126 mg/dL — AB (ref 70–99)
POTASSIUM: 4.6 meq/L (ref 3.7–5.3)
SODIUM: 137 meq/L (ref 137–147)

## 2013-02-10 LAB — POCT I-STAT TROPONIN I: Troponin i, poc: 0.07 ng/mL (ref 0.00–0.08)

## 2013-02-10 LAB — APTT: aPTT: 28 seconds (ref 24–37)

## 2013-02-10 LAB — POCT ACTIVATED CLOTTING TIME: ACTIVATED CLOTTING TIME: 498 s

## 2013-02-10 LAB — TROPONIN I

## 2013-02-10 LAB — PROTIME-INR
INR: 1.15 (ref 0.00–1.49)
PROTHROMBIN TIME: 14.5 s (ref 11.6–15.2)

## 2013-02-10 SURGERY — CORONARY ANGIOGRAM

## 2013-02-10 MED ORDER — HEPARIN SODIUM (PORCINE) 1000 UNIT/ML IJ SOLN
INTRAMUSCULAR | Status: AC
  Filled 2013-02-10: qty 1

## 2013-02-10 MED ORDER — HEPARIN SODIUM (PORCINE) 5000 UNIT/ML IJ SOLN
4000.0000 [IU] | INTRAMUSCULAR | Status: AC
  Administered 2013-02-10: 4000 [IU] via INTRAVENOUS

## 2013-02-10 MED ORDER — PERFLUTREN LIPID MICROSPHERE
1.0000 mL | INTRAVENOUS | Status: AC | PRN
  Administered 2013-02-10: 2 mL via INTRAVENOUS

## 2013-02-10 MED ORDER — ASPIRIN 81 MG PO CHEW
81.0000 mg | CHEWABLE_TABLET | Freq: Every day | ORAL | Status: DC
Start: 2013-02-10 — End: 2013-02-12
  Administered 2013-02-10 – 2013-02-12 (×3): 81 mg via ORAL
  Filled 2013-02-10 (×3): qty 1

## 2013-02-10 MED ORDER — NITROGLYCERIN 0.4 MG SL SUBL
SUBLINGUAL_TABLET | SUBLINGUAL | Status: AC
  Filled 2013-02-10: qty 25

## 2013-02-10 MED ORDER — ATORVASTATIN CALCIUM 80 MG PO TABS
80.0000 mg | ORAL_TABLET | Freq: Every day | ORAL | Status: DC
  Administered 2013-02-10 – 2013-02-11 (×2): 80 mg via ORAL
  Filled 2013-02-10 (×3): qty 1

## 2013-02-10 MED ORDER — PERFLUTREN LIPID MICROSPHERE
INTRAVENOUS | Status: AC
  Filled 2013-02-10: qty 10

## 2013-02-10 MED ORDER — BIVALIRUDIN 250 MG IV SOLR
INTRAVENOUS | Status: AC
  Filled 2013-02-10: qty 250

## 2013-02-10 MED ORDER — METOPROLOL TARTRATE 25 MG PO TABS
25.0000 mg | ORAL_TABLET | Freq: Two times a day (BID) | ORAL | Status: DC
  Administered 2013-02-10 – 2013-02-12 (×5): 25 mg via ORAL
  Filled 2013-02-10 (×6): qty 1

## 2013-02-10 MED ORDER — NITROGLYCERIN IN D5W 200-5 MCG/ML-% IV SOLN
5.0000 ug/min | INTRAVENOUS | Status: DC
  Administered 2013-02-10: 5 ug/min via INTRAVENOUS
  Filled 2013-02-10: qty 250

## 2013-02-10 MED ORDER — MIDAZOLAM HCL 2 MG/2ML IJ SOLN
INTRAMUSCULAR | Status: AC
Start: 2013-02-10 — End: 2013-02-10
  Filled 2013-02-10: qty 2

## 2013-02-10 MED ORDER — ACETAMINOPHEN 325 MG PO TABS: 650.0000 mg | ORAL_TABLET | ORAL | Status: DC | PRN

## 2013-02-10 MED ORDER — TICAGRELOR 90 MG PO TABS
ORAL_TABLET | ORAL | Status: AC
  Filled 2013-02-10: qty 1

## 2013-02-10 MED ORDER — ONDANSETRON HCL 4 MG/2ML IJ SOLN: 4.0000 mg | Freq: Four times a day (QID) | INTRAMUSCULAR | Status: DC | PRN

## 2013-02-10 MED ORDER — TICAGRELOR 90 MG PO TABS
90.0000 mg | ORAL_TABLET | Freq: Two times a day (BID) | ORAL | Status: DC
  Administered 2013-02-10 – 2013-02-12 (×5): 90 mg via ORAL
  Filled 2013-02-10 (×6): qty 1

## 2013-02-10 MED ORDER — SODIUM CHLORIDE 0.9 % IJ SOLN
3.0000 mL | INTRAMUSCULAR | Status: DC | PRN
  Administered 2013-02-10: 3 mL via INTRAVENOUS

## 2013-02-10 MED ORDER — SODIUM CHLORIDE 0.9 % IV SOLN
INTRAVENOUS | Status: DC
  Administered 2013-02-10: 20 mL/h via INTRAVENOUS

## 2013-02-10 MED ORDER — SODIUM CHLORIDE 0.9 % IV SOLN
1.0000 mL/kg/h | INTRAVENOUS | Status: AC
  Administered 2013-02-10: 1 mL/kg/h via INTRAVENOUS

## 2013-02-10 MED ORDER — LIDOCAINE HCL (PF) 1 % IJ SOLN
INTRAMUSCULAR | Status: AC
  Filled 2013-02-10: qty 30

## 2013-02-10 MED ORDER — OXYCODONE-ACETAMINOPHEN 5-325 MG PO TABS
1.0000 | ORAL_TABLET | ORAL | Status: DC | PRN
  Administered 2013-02-10: 1 via ORAL
  Filled 2013-02-10: qty 2
  Filled 2013-02-10: qty 1
  Filled 2013-02-10: qty 2

## 2013-02-10 MED ORDER — VERAPAMIL HCL 2.5 MG/ML IV SOLN
INTRAVENOUS | Status: AC
  Filled 2013-02-10: qty 2

## 2013-02-10 MED ORDER — ASPIRIN 81 MG PO CHEW
CHEWABLE_TABLET | ORAL | Status: AC
  Filled 2013-02-10: qty 4

## 2013-02-10 MED ORDER — NITROGLYCERIN 0.4 MG SL SUBL
0.4000 mg | SUBLINGUAL_TABLET | SUBLINGUAL | Status: DC | PRN
  Administered 2013-02-10 (×2): 0.4 mg via SUBLINGUAL

## 2013-02-10 MED ORDER — METOPROLOL TARTRATE 1 MG/ML IV SOLN
INTRAVENOUS | Status: AC
  Filled 2013-02-10: qty 10

## 2013-02-10 MED ORDER — HEPARIN (PORCINE) IN NACL 2-0.9 UNIT/ML-% IJ SOLN
INTRAMUSCULAR | Status: AC
  Filled 2013-02-10: qty 1000

## 2013-02-10 MED ORDER — METOPROLOL TARTRATE 1 MG/ML IV SOLN
5.0000 mg | Freq: Once | INTRAVENOUS | Status: AC
  Administered 2013-02-10: 5 mg via INTRAVENOUS

## 2013-02-10 MED ORDER — TICAGRELOR 90 MG PO TABS
ORAL_TABLET | ORAL | Status: AC
  Administered 2013-02-10: 90 mg via ORAL
  Filled 2013-02-10: qty 1

## 2013-02-10 MED ORDER — LISINOPRIL 10 MG PO TABS
10.0000 mg | ORAL_TABLET | Freq: Every day | ORAL | Status: DC
  Administered 2013-02-10 – 2013-02-12 (×3): 10 mg via ORAL
  Filled 2013-02-10 (×3): qty 1

## 2013-02-10 MED ORDER — NITROGLYCERIN 0.2 MG/ML ON CALL CATH LAB
INTRAVENOUS | Status: AC
  Filled 2013-02-10: qty 1

## 2013-02-10 MED ORDER — ASPIRIN 81 MG PO CHEW
324.0000 mg | CHEWABLE_TABLET | Freq: Once | ORAL | Status: AC
  Administered 2013-02-10: 324 mg via ORAL

## 2013-02-10 MED ORDER — FENTANYL CITRATE 0.05 MG/ML IJ SOLN
INTRAMUSCULAR | Status: AC
  Filled 2013-02-10: qty 2

## 2013-02-10 MED ORDER — MORPHINE SULFATE 2 MG/ML IJ SOLN: 2.0000 mg | INTRAMUSCULAR | Status: DC | PRN

## 2013-02-10 MED ORDER — HEPARIN SODIUM (PORCINE) 5000 UNIT/ML IJ SOLN
5000.0000 [IU] | Freq: Three times a day (TID) | INTRAMUSCULAR | Status: DC
Start: 2013-02-10 — End: 2013-02-12
  Administered 2013-02-10 – 2013-02-12 (×6): 5000 [IU] via SUBCUTANEOUS
  Filled 2013-02-10 (×9): qty 1

## 2013-02-10 NOTE — ED Provider Notes (Signed)
CSN: [id number]     Arrival date & time 02/10/13  0144 History   First MD Initiated Contact with Patient 02/10/13 0153     Chief Complaint  Patient presents with  . Chest Pain   (Consider location/radiation/quality/duration/timing/severity/associated sxs/prior Treatment) HPI 53 yo male presents to the ER from home with complaint of chest pain.  Pt reports central/left sided chest pain waking him from sleep.  Pain is burning/pressure.  Associated with sob, diaphoresis.  He reports over the last week he has had similar intermittent pain with exertion, cold.  No prior h/o same.  H/o HTN, hyperlipidemia.  No missed medications.  Does not take aspirin on regular basis.  Past Medical History  Diagnosis Date  . Hypertension    History reviewed. No pertinent past surgical history. No family history on file. History  Substance Use Topics  . Smoking status: Current Some Day Smoker  . Smokeless tobacco: Former NeurosurgeonUser    Quit date: 11/30/2011  . Alcohol Use: No    Review of Systems  All other systems reviewed and are negative.    Allergies  Review of patient's allergies indicates no known allergies.  Home Medications   Current Outpatient Rx  Name  Route  Sig  Dispense  Refill  . atorvastatin (LIPITOR) 20 MG tablet   Oral   Take 1 tablet (20 mg total) by mouth daily.   90 tablet   3   . lisinopril-hydrochlorothiazide (PRINZIDE,ZESTORETIC) 20-25 MG per tablet   Oral   Take 1 tablet by mouth daily.   90 tablet   3    Pulse 97  Wt 207 lb (93.895 kg) Physical Exam  Nursing note and vitals reviewed. Constitutional: He is oriented to person, place, and time. He appears well-developed and well-nourished. He appears distressed.  HENT:  Head: Normocephalic and atraumatic.  Right Ear: External ear normal.  Left Ear: External ear normal.  Nose: Nose normal.  Mouth/Throat: Oropharynx is clear and moist.  Eyes: Conjunctivae and EOM are normal. Pupils are equal, round, and reactive to  light.  Neck: Normal range of motion. Neck supple. No JVD present. No tracheal deviation present. No thyromegaly present.  Cardiovascular: Normal rate, regular rhythm, normal heart sounds and intact distal pulses.  Exam reveals no gallop and no friction rub.   No murmur heard. Pulmonary/Chest: Effort normal and breath sounds normal. No stridor. No respiratory distress. He has no wheezes. He has no rales. He exhibits no tenderness.  Abdominal: Soft. Bowel sounds are normal. He exhibits no distension and no mass. There is no tenderness. There is no rebound and no guarding.  Musculoskeletal: Normal range of motion. He exhibits no edema and no tenderness.  Lymphadenopathy:    He has no cervical adenopathy.  Neurological: He is alert and oriented to person, place, and time. He exhibits normal muscle tone. Coordination normal.  Skin: Skin is warm. No rash noted. He is diaphoretic. No erythema. No pallor.  Psychiatric: He has a normal mood and affect. His behavior is normal. Judgment and thought content normal.    ED Course  Procedures (including critical care time)  CRITICAL CARE Performed by: Olivia MackieTTER,Shavona Gunderman M Total critical care time: 30 min Critical care time was exclusive of separately billable procedures and treating other patients. Critical care was necessary to treat or prevent imminent or life-threatening deterioration. Critical care was time spent personally by me on the following activities: development of treatment plan with patient and/or surrogate as well as nursing, discussions with consultants, evaluation of  patient's response to treatment, examination of patient, obtaining history from patient or surrogate, ordering and performing treatments and interventions, ordering and review of laboratory studies, ordering and review of radiographic studies, pulse oximetry and re-evaluation of patient's condition.  Labs Review Labs Reviewed  POCT I-STAT, CHEM 8 - Abnormal; Notable for the  following:    Sodium 136 (*)    Potassium 7.1 (*)    BUN 27 (*)    Glucose, Bld 134 (*)    Calcium, Ion 1.01 (*)    All other components within normal limits  APTT  CBC  COMPREHENSIVE METABOLIC PANEL  PROTIME-INR   Imaging Review No results found.  EKG Interpretation    Date/Time:  Thursday February 10 2013 01:46:03 EST Ventricular Rate:  93 PR Interval:  164 QRS Duration: 93 QT Interval:  357 QTC Calculation: 444 R Axis:   -4 Text Interpretation:  Sinus rhythm Probable anteroseptal infarct, recent Lateral leads are also involved ** ** ACUTE MI / STEMI ** ** Confirmed by Sagan Wurzel  MD, Jumaane Weatherford ([id number]) on 02/10/2013 2:11:02 AM            MDM   1. STEMI (ST elevation myocardial infarction)    53 yo male with acute chest pain, ST elevations in V1,V2 with st depressions in inferior leads.  Code STEMI initiated, pt given asa, heparin, lopressor, NTG.  D/w Dr [place].  Initial istat with K of 7.1, suspect hemolyzed sample, CMET pending.  No peaked Twaves noted on EKG.        Olivia Mackie, MD 02/10/13 [phone]

## 2013-02-10 NOTE — CV Procedure (Signed)
    Cardiac Catheterization Procedure Note  Name: Jason Foster MRN: [id number] DOB: 1960-04-13  Procedure:  Selective Coronary Angiography, PTCA and stenting of the proximal to mid LAD  Indication: 53 yo male with history of HTN, hyperlipidemia, and tobacco use presents with an anterior STEMI.  Procedural Details:  The right wrist was prepped, draped, and anesthetized with 1% lidocaine. Using the modified Seldinger technique, a 6 French sheath was introduced into the right radial artery. 3 mg of verapamil was administered through the sheath, weight-based unfractionated heparin was administered intravenously. Standard Judkins catheters were used for selective coronary angiography. Due to tortuosity of the innominate artery catheter manipulations were difficult and we did not cross the AV. Catheter exchanges were performed over an exchange length guidewire.  PROCEDURAL FINDINGS Hemodynamics: AO 131/89 mean 110 mm Hg LV N/A   Coronary angiography: Coronary dominance: right  Left mainstem: 20% at the very distal left main.  Left anterior descending (LAD): 100% proximal.   Ramus intermediate: moderate to large in size. Normal.   Left circumflex (LCx): The LCx gives off 3 OM branches before terminating in the AV groove. Mild irregularities less than 20%.  Right coronary artery (RCA): 100% occlusion in the mid vessel. Left to right collaterals to the distal vessel.   Left ventriculography: Not doen  PCI Note:  Following the diagnostic procedure, the decision was made to proceed with PCI of the culprit LAD. Brilinta 180 mg was given orally. Weight-based bivalirudin was given for anticoagulation. Once a therapeutic ACT was achieved, a 6 [place] XBLAD guide catheter was inserted. Guide backup was limited. A prowater coronary guidewire was used to cross the lesion.  The lesion was predilated with a 2.5 mm then 3.0 mm balloon. With reperfusion there was noted to be extensive severe disease  throughout the proximal to mid vessel.The lesion was then stented with a 3.0 x 38 mm Promus stent in the mid vessel. A second 3.5 x 32 mm Promus stent was placed in the proximal LAD in an overlapping fashion. The stent was postdilated with a 3.5 mm noncompliant balloon.  Following PCI, there was 0% residual stenosis and TIMI-3 flow. Final angiography confirmed an excellent result. The patient tolerated the procedure well. There were no immediate procedural complications. A TR band was used for radial hemostasis. The patient was transferred to the post catheterization recovery area for further monitoring.  PCI Data: Vessel - LAD/Segment - proximal Percent Stenosis (pre)  100% TIMI-flow 0 Stent 3.0 x 38 mm Promus, 3.5 x 32 mm Promus Percent Stenosis (post) 0% TIMI-flow (post) 3  Final Conclusions:   1. Severe 2 vessel obstructive CAD 2. Successful stenting of the proximal to mid LAD with overlapping DES.     Recommendations:  Continue dual antiplatelet therapy indefinitely. Aggressive risk factor modification. Will obtain an Echo to assess LV function. Would treat RCA disease medically. If he has refractory angina could consider CTO intervention in the future.  Theron Arista [facility] 02/10/2013, 3:59 AM

## 2013-02-10 NOTE — Progress Notes (Signed)
Chaplain responded to code stemi.  Met family in [facility] waiting room.  Sister-in-law of patient present, with daughter of patient.  Wife and son of patient at home. Spiritual conversation over 45 minutes..comfort measures declined. Assisted family to meet with patient after procedure, then walked them to ED exit, giving instructions for best parking when returning to hospital.  Rev. [place], [place]

## 2013-02-10 NOTE — H&P (Signed)
Admission History and Physical     Patient ID: Jason Foster, MRN: [id number], DOB: 07/01/1960 53 y.o. Date of Encounter: 02/10/2013, 4:39 AM  Primary Physician: Jeanann LewandowskyJEGEDE, OLUGBEMIGA, MD Primary Cardiologist: None  Chief Complaint: Chest pain - STEMI  History of Present Illness: Jason Foster is a 53 y.o. male with a history of HTN and Hyperlipidemia, who presents after waking up from sleep with 10/10 crushing chest pain radiating to neck and arms.  He states that starting about 2 weeks ago, he noticed chest pain when he exerted himself.  He has done less activity over the past two weeks because of this.  He was pain free when he went to sleep last night, but woke up with severe CP, and [facility] where EKG showed anterior ST-elevation.  He was started on heparin gtt and aspirin, and [facility].  He is a former smoker, quit 1 year go.  No EtOH.  Accompanied today with wife and daughter.   Of note, initial i-stat K was 7.1, on i-stat in cath lab, K was 3.7.     Past Medical History  Diagnosis Date  . Hypertension      History reviewed. No pertinent past surgical history.   No current facility-administered medications on file prior to encounter.   Current Outpatient Prescriptions on File Prior to Encounter  Medication Sig Dispense Refill  . atorvastatin (LIPITOR) 20 MG tablet Take 1 tablet (20 mg total) by mouth daily.  90 tablet  3  . lisinopril-hydrochlorothiazide (PRINZIDE,ZESTORETIC) 20-25 MG per tablet Take 1 tablet by mouth daily.  90 tablet  3     Current Facility-Administered Medications  Medication Dose Route Frequency Provider Last Rate Last Dose  . 0.9 %  sodium chloride infusion   Intravenous Continuous Olivia Mackielga M Otter, MD   20 mL/hr at 02/10/13 0207  . 0.9 %  sodium chloride infusion  1 mL/kg/hr Intravenous Continuous Peter M SwazilandJordan, MD 93.9 mL/hr at 02/10/13 0438 1 mL/kg/hr at 02/10/13 0438  . acetaminophen (TYLENOL) tablet 650  mg  650 mg Oral Q4H PRN Peter M [place]az[place]Jordan, MD      . aspirin 81 MG chewable tablet           . aspirin chewable tablet 81 mg  81 mg Oral Daily Peter M [place]az[place]Jordan, MD      . atorvastatin (LIPITOR) tablet 80 mg  80 mg Oral q1800 Peter M [place]azilandJordan, MD      . heparin injection 5,000 Units  5,000 Units Subcutaneous Q8H Peter M [place]azilandJordan, MD      . lisinopril (PRINIVIL,ZESTRIL) tablet 10 mg  10 mg Oral Daily Peter M [place]Jordan, MD      . metoprolol (LOPRESSOR) 1 MG/ML injection           . metoprolol tartrate (LOPRESSOR) tablet 25 mg  25 mg Oral BID Peter M [place]Jordan, MD      . morphine 2 MG/ML injection 2 mg  2 mg Intravenous Q1H PRN Peter M [place]Jordan, MD      . nitroGLYCERIN (NITROSTAT) 0.4 MG SL tablet           . nitroGLYCERIN (NITROSTAT) SL tablet 0.4 mg  0.4 mg Sublingual Q5 min PRN Olivia Mackielga M Otter, MD   0.4 mg at 02/10/13 0159  . nitroGLYCERIN 0.2 mg/mL in dextrose 5 % infusion  5 mcg/min Intravenous Titrated Olivia Mackielga M Otter, MD 4.5 mL/hr at 02/10/13 0223 15 mcg/min at 02/10/13 0223  .  ondansetron (ZOFRAN) injection 4 mg  4 mg Intravenous Q6H PRN Peter M [place], MD      . oxyCODONE-acetaminophen (PERCOCET/ROXICET) 5-325 MG per tablet 1-2 tablet  1-2 tablet Oral Q4H PRN Peter M [place], MD      . Ticagrelor [facility]) tablet 90 mg  90 mg Oral BID Peter M [place], MD          Allergies: No Known Allergies   Social History:  The patient  reports that he has been smoking.  He quit smokeless tobacco use about 14 months ago. He reports that he does not drink alcohol or use illicit drugs.   Family History:  The patient's family history is not on file.   ROS:  Please see the history of present illness.     All other systems reviewed and negative.   Vital Signs: Blood pressure 128/94, pulse 76, temperature 98.1 F (36.7 C), temperature source Oral, resp. rate 22, height 5\' 7"  (1.702 m), weight 91.8 kg (202 lb 6.1 oz), SpO2 98.00%.  PHYSICAL EXAM: General:  Well nourished, well developed, in no acute distress (Post  PCI) HEENT: normal Lymph: no adenopathy Neck: no JVD Endocrine:  No thryomegaly Vascular: No carotid bruits; FA pulses 2+ bilaterally without bruits Cardiac:  normal S1, S2; RRR; no murmur Lungs:  clear to auscultation bilaterally, no wheezing, rhonchi or rales Abd: soft, nontender, no hepatomegaly Ext: no edema Musculoskeletal:  No deformities, BUE and BLE strength normal and equal Skin: warm and dry Neuro:  CNs 2-12 intact, no focal abnormalities noted Psych:  Normal affect   EKG:  NSR, ST-Elevation V2-v4.  Labs:   Lab Results  Component Value Date   WBC 15.7* 02/10/2013   HGB 17.0 02/10/2013   HCT 50.0 02/10/2013   MCV 79.4 02/10/2013   PLT 251 02/10/2013     Recent Labs Lab 02/10/13 0200 02/10/13 0207  NA 132* 136*  K 6.9* 7.1*  CL 96 104  CO2 21  --   BUN 19 27*  CREATININE 0.91 1.10  CALCIUM 8.9  --   PROT 8.9*  --   BILITOT 0.3  --   ALKPHOS 95  --   ALT 26  --   AST 50*  --   GLUCOSE 137* 134*   No results found for this basename: CKTOTAL, CKMB, TROPONINI,  in the last 72 hours Lab Results  Component Value Date   CHOL 211* 11/29/2012   HDL 30* 11/29/2012   LDLCALC 140* 11/29/2012   TRIG 203* 11/29/2012   No results found for this basename: DDIMER   No results found for this basename: HGBA1C     Coronary Angiography:  Left mainstem: 20% at the very distal left main.  Left anterior descending (LAD): 100% proximal.  Ramus intermediate: moderate to large in size. Normal.  Left circumflex (LCx): The LCx gives off 3 OM branches before terminating in the AV groove. Mild irregularities less than 20%.  Right coronary artery (RCA): 100% occlusion in the mid vessel. Left to right collaterals to the distal vessel.  Left ventriculography: Not done    ASSESSMENT AND PLAN:   1. 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   -  Long prox LAD lesion, required overlapping drug-eluting stents.   - Ticagralor and ASA started, plan for life-long DAPT as tolerated. - metoprolol and lisinopril started - atorvastatin 80mg  - Check HbA1C -  TTE in AM.  2.  Elevated Potassium on initial i-stat - normalized on cath lab i-stat, likely hemolyzed - formal labs this morning.  3. HTN - BB, ACE-i as above.  Signed,  Yaakov Guthrie, MD 02/10/2013, 4:39 AM

## 2013-02-10 NOTE — Care Management Note (Signed)
    Page 1 of 1   02/10/2013     10:16:00 AM   CARE MANAGEMENT NOTE 02/10/2013  Patient:  Jason Foster, Jason Foster   Account Number:  [id number]  Date Initiated:  02/10/2013  Documentation initiated by:  Junius Creamer  Subjective/Objective Assessment:   adm w stemi     Action/Plan:   lives w wife, pcp dr Val Eagle jegede   Anticipated DC Date:     Anticipated DC Plan:        DC Planning Services  CM consult  Medication Assistance      Choice offered to / List presented to:             Status of service:   Medicare Important Message given?   (If response is "NO", the following Medicare IM given date fields will be blank) Date Medicare IM given:   Date Additional Medicare IM given:    Discharge Disposition:    Per UR Regulation:  Reviewed for med. necessity/level of care/duration of stay  If discussed at Long Length of Stay Meetings, dates discussed:    Comments:  1/29 1014a debbie Brayen Bunn rn,bsn spoke w pt. he [facility] [place]. placed brilinta pt assist form on chart for md. gave pt 30day free brilinta card.

## 2013-02-10 NOTE — ED Notes (Signed)
Pt c/o central chest pain, non radiating, awoke from sleep 30 min ago. Denies diaphoresis, denies n/v/d

## 2013-02-10 NOTE — Progress Notes (Signed)
Echocardiogram 2D Echocardiogram with Definity has been performed.  Jason Foster 02/10/2013, 9:26 AM

## 2013-02-10 NOTE — Progress Notes (Signed)
DAILY PROGRESS NOTE  Subjective:  No events overnight. Presented with anterior STEMI and had a long-area of DES placed (3.0 x 38 mm Promus Premier / overlapping 3.5 x 32 mm Promus Element). Echo was performed today which shows EF 35-40%, expected akinesis of the mid-distal anteroseptal myocardium and apex. No further chest pain. TR band still in place.  Objective:  Temp:  [98.1 F (36.7 C)-98.6 F (37 C)] 98.6 F (37 C) (01/29 0800) Pulse Rate:  [72-97] 77 (01/29 1000) Resp:  [14-25] 18 (01/29 1000) BP: (110-182)/(70-133) 131/83 mmHg (01/29 1000) SpO2:  [95 %-100 %] 97 % (01/29 1000) Weight:  [202 lb 6.1 oz (91.8 kg)-207 lb (93.895 kg)] 202 lb 6.1 oz (91.8 kg) (01/29 0400) Weight change:   Intake/Output from previous day: 01/28 0701 - 01/29 0700 In: 293.8 [I.V.:293.8] Out: 800 [Urine:800]  Intake/Output from this shift: Total I/O In: 415.2 [P.O.:120; I.V.:295.2] Out: 250 [Urine:250]  Medications: Current Facility-Administered Medications  Medication Dose Route Frequency Provider Last Rate Last Dose  . 0.9 %  sodium chloride infusion   Intravenous Continuous Kalman Drape, MD   20 mL/hr at 02/10/13 0207  . 0.9 %  sodium chloride infusion  1 mL/kg/hr Intravenous Continuous Peter M Martinique, MD 93.9 mL/hr at 02/10/13 0700 1 mL/kg/hr at 02/10/13 0700  . acetaminophen (TYLENOL) tablet 650 mg  650 mg Oral Q4H PRN Peter M Martinique, MD      . aspirin 81 MG chewable tablet           . aspirin chewable tablet 81 mg  81 mg Oral Daily Peter M Martinique, MD   81 mg at 02/10/13 0941  . atorvastatin (LIPITOR) tablet 80 mg  80 mg Oral q1800 Peter M Martinique, MD      . heparin injection 5,000 Units  5,000 Units Subcutaneous Q8H Peter M Martinique, MD      . lisinopril (PRINIVIL,ZESTRIL) tablet 10 mg  10 mg Oral Daily Peter M Martinique, MD   10 mg at 02/10/13 4239  . metoprolol (LOPRESSOR) 1 MG/ML injection           . metoprolol tartrate (LOPRESSOR) tablet 25 mg  25 mg Oral BID Peter M Martinique, MD   25 mg  at 02/10/13 0941  . morphine 2 MG/ML injection 2 mg  2 mg Intravenous Q1H PRN Peter M Martinique, MD      . nitroGLYCERIN (NITROSTAT) SL tablet 0.4 mg  0.4 mg Sublingual Q5 min PRN Kalman Drape, MD   0.4 mg at 02/10/13 0159  . nitroGLYCERIN 0.2 mg/mL in dextrose 5 % infusion  5 mcg/min Intravenous Titrated Kalman Drape, MD 4.5 mL/hr at 02/10/13 0700 15 mcg/min at 02/10/13 0700  . ondansetron (ZOFRAN) injection 4 mg  4 mg Intravenous Q6H PRN Peter M Martinique, MD      . oxyCODONE-acetaminophen (PERCOCET/ROXICET) 5-325 MG per tablet 1-2 tablet  1-2 tablet Oral Q4H PRN Peter M Martinique, MD      . PERFLUTREN LIPID MICROSPHERE injection SUSP           . Ticagrelor (BRILINTA) tablet 90 mg  90 mg Oral BID Peter M Martinique, MD   90 mg at 02/10/13 0940    Physical Exam: General appearance: alert and no distress Neck: no carotid bruit and no JVD Lungs: clear to auscultation bilaterally Heart: regular rate and rhythm, S1, S2 normal, no murmur, click, rub or gallop Abdomen: soft, non-tender; bowel sounds normal; no masses,  no organomegaly Extremities: extremities normal,  atraumatic, no cyanosis or edema and TR band in place Pulses: 2+ and symmetric Skin: Skin color, texture, turgor normal. No rashes or lesions Neurologic: Grossly normal Psych: Pleasant, normal mood  Lab Results: Results for orders placed during the hospital encounter of 02/10/13 (from the past 48 hour(s))  APTT     Status: None   Collection Time    02/10/13  2:00 AM      Result Value Range   aPTT 28  24 - 37 seconds  CBC     Status: Abnormal   Collection Time    02/10/13  2:00 AM      Result Value Range   WBC 15.7 (*) 4.0 - 10.5 K/uL   RBC 5.79  4.22 - 5.81 MIL/uL   Hemoglobin 15.3  13.0 - 17.0 g/dL   HCT 46.0  39.0 - 52.0 %   MCV 79.4  78.0 - 100.0 fL   MCH 26.4  26.0 - 34.0 pg   MCHC 33.3  30.0 - 36.0 g/dL   RDW 14.7  11.5 - 15.5 %   Platelets 251  150 - 400 K/uL  COMPREHENSIVE METABOLIC PANEL     Status: Abnormal   Collection  Time    02/10/13  2:00 AM      Result Value Range   Sodium 132 (*) 137 - 147 mEq/L   Potassium 6.9 (*) 3.7 - 5.3 mEq/L   Comment: MODERATE HEMOLYSIS     CRITICAL RESULT CALLED TO, READ BACK BY AND VERIFIED WITH:     SPOKE WITH SHELL,A RN [phone] HAMER,N   Chloride 96  96 - 112 mEq/L   CO2 21  19 - 32 mEq/L   Glucose, Bld 137 (*) 70 - 99 mg/dL   BUN 19  6 - 23 mg/dL   Creatinine, Ser 0.91  0.50 - 1.35 mg/dL   Calcium 8.9  8.4 - 10.5 mg/dL   Total Protein 8.9 (*) 6.0 - 8.3 g/dL   Albumin 3.9  3.5 - 5.2 g/dL   AST 50 (*) 0 - 37 U/L   Comment: MODERATE HEMOLYSIS   ALT 26  0 - 53 U/L   Comment: MODERATE HEMOLYSIS   Alkaline Phosphatase 95  39 - 117 U/L   Comment: MODERATE HEMOLYSIS   Total Bilirubin 0.3  0.3 - 1.2 mg/dL   GFR calc non Af Amer >90  >90 mL/min   GFR calc Af Amer >90  >90 mL/min   Comment: (NOTE)     The eGFR has been calculated using the CKD EPI equation.     This calculation has not been validated in all clinical situations.     eGFR's persistently <90 mL/min signify possible Chronic Kidney     Disease.  PROTIME-INR     Status: None   Collection Time    02/10/13  2:00 AM      Result Value Range   Prothrombin Time 14.5  11.6 - 15.2 seconds   INR 1.15  0.00 - 1.49  POCT I-STAT TROPONIN I     Status: None   Collection Time    02/10/13  2:06 AM      Result Value Range   Troponin i, poc 0.07  0.00 - 0.08 ng/mL   Comment 3            Comment: Due to the release kinetics of cTnI,     a negative result within the first hours     of the onset of symptoms does not  rule out     myocardial infarction with certainty.     If myocardial infarction is still suspected,     repeat the test at appropriate intervals.  POCT I-STAT, CHEM 8     Status: Abnormal   Collection Time    02/10/13  2:07 AM      Result Value Range   Sodium 136 (*) 137 - 147 mEq/L   Potassium 7.1 (*) 3.7 - 5.3 mEq/L   Chloride 104  96 - 112 mEq/L   BUN 27 (*) 6 - 23 mg/dL   Creatinine, Ser 1.10   0.50 - 1.35 mg/dL   Glucose, Bld 134 (*) 70 - 99 mg/dL   Calcium, Ion 1.01 (*) 1.12 - 1.23 mmol/L   TCO2 25  0 - 100 mmol/L   Hemoglobin 17.0  13.0 - 17.0 g/dL   HCT 50.0  39.0 - 52.0 %   Comment NOTIFIED PHYSICIAN    POCT ACTIVATED CLOTTING TIME     Status: None   Collection Time    02/10/13  2:56 AM      Result Value Range   Activated Clotting Time 498    MRSA PCR SCREENING     Status: None   Collection Time    02/10/13  4:18 AM      Result Value Range   MRSA by PCR NEGATIVE  NEGATIVE   Comment:            The GeneXpert MRSA Assay (FDA     approved for NASAL specimens     only), is one component of a     comprehensive MRSA colonization     surveillance program. It is not     intended to diagnose MRSA     infection nor to guide or     monitor treatment for     MRSA infections.  BASIC METABOLIC PANEL     Status: Abnormal   Collection Time    02/10/13  7:05 AM      Result Value Range   Sodium 137  137 - 147 mEq/L   Potassium 4.6  3.7 - 5.3 mEq/L   Chloride 99  96 - 112 mEq/L   CO2 23  19 - 32 mEq/L   Glucose, Bld 126 (*) 70 - 99 mg/dL   BUN 15  6 - 23 mg/dL   Creatinine, Ser 0.88  0.50 - 1.35 mg/dL   Calcium 8.4  8.4 - 10.5 mg/dL   GFR calc non Af Amer >90  >90 mL/min   GFR calc Af Amer >90  >90 mL/min   Comment: (NOTE)     The eGFR has been calculated using the CKD EPI equation.     This calculation has not been validated in all clinical situations.     eGFR's persistently <90 mL/min signify possible Chronic Kidney     Disease.  CBC     Status: Abnormal   Collection Time    02/10/13  7:05 AM      Result Value Range   WBC 13.1 (*) 4.0 - 10.5 K/uL   RBC 5.14  4.22 - 5.81 MIL/uL   Hemoglobin 13.8  13.0 - 17.0 g/dL   HCT 40.5  39.0 - 52.0 %   MCV 78.8  78.0 - 100.0 fL   MCH 26.8  26.0 - 34.0 pg   MCHC 34.1  30.0 - 36.0 g/dL   RDW 14.5  11.5 - 15.5 %   Platelets 206  150 - 400 K/uL  Imaging: Echocardiogram (02/10/13) Study Conclusions  - Left ventricle:  The cavity size was normal. Wall thickness was increased in a pattern of mild LVH. Systolic function was moderately reduced. The estimated ejection fraction was in the range of 35% to 40%. Akinesis of the mid-distalanteroseptal myocardium. Akinesis of the apical myocardium. - Pulmonary arteries: PA peak pressure: 58m Hg (S).  Assessment:  Principal Problem:   ST elevation myocardial infarction (STEMI) of anterior wall Active Problems:   Essential hypertension, benign   Dyslipidemia   Cardiomyopathy, ischemic   Antiplatelet or antithrombotic long-term use   Plan:  1. Doing well after anterior STEMI. ST elevations are improving on EKG this morning. No further chest pain. Wean off of nitroglycerin gtts. Discontinue TR band. Agree that given the length of his LAD stents, he will need lifelong DAPT therapy. On lipitor 80 mg daily, lisinopril 10 mg daily and lopressor 25 mg BID.  Would rest today, but may be able to get up with rehab later or tomorrow morning.  Check CBC, BMP and BNP in am tomorrow.   Time Spent Directly with Patient:  15 minutes  Length of Stay:  LOS: 0 days   KPixie Casino MD, FSanta [facility]-MontgomeryAttending Cardiologist [facility]  HILTY,Kenneth C 02/10/2013, 10:57 AM

## 2013-02-10 NOTE — ED Notes (Signed)
EMS present to transport patient to [facility]

## 2013-02-10 NOTE — ED Notes (Signed)
Notified EDP, Otter,MD. Pt. i-stat Chem 8 results potassium 7.1.

## 2013-02-10 NOTE — ED Notes (Addendum)
Patient transported to [facility] via [facility] with Lou Cal, Charity fundraiser. Report received from RN. Assumed care.

## 2013-02-11 LAB — BASIC METABOLIC PANEL
BUN: 12 mg/dL (ref 6–23)
CALCIUM: 9.2 mg/dL (ref 8.4–10.5)
CO2: 22 mEq/L (ref 19–32)
Chloride: 99 mEq/L (ref 96–112)
Creatinine, Ser: 0.89 mg/dL (ref 0.50–1.35)
GLUCOSE: 130 mg/dL — AB (ref 70–99)
POTASSIUM: 4.3 meq/L (ref 3.7–5.3)
Sodium: 138 mEq/L (ref 137–147)

## 2013-02-11 LAB — CBC
HCT: 44.3 % (ref 39.0–52.0)
HEMOGLOBIN: 15 g/dL (ref 13.0–17.0)
MCH: 26.8 pg (ref 26.0–34.0)
MCHC: 33.9 g/dL (ref 30.0–36.0)
MCV: 79.1 fL (ref 78.0–100.0)
Platelets: 222 10*3/uL (ref 150–400)
RBC: 5.6 MIL/uL (ref 4.22–5.81)
RDW: 14.6 % (ref 11.5–15.5)
WBC: 12.3 10*3/uL — ABNORMAL HIGH (ref 4.0–10.5)

## 2013-02-11 LAB — PRO B NATRIURETIC PEPTIDE: PRO B NATRI PEPTIDE: 2800 pg/mL — AB (ref 0–125)

## 2013-02-11 LAB — TROPONIN I
Troponin I: >20 ng/mL (ref <0.30)
Troponin I: >20 ng/mL (ref <0.30)

## 2013-02-11 MED ORDER — FUROSEMIDE 20 MG PO TABS
20.0000 mg | ORAL_TABLET | Freq: Every day | ORAL | Status: DC
  Administered 2013-02-11 – 2013-02-12 (×2): 20 mg via ORAL
  Filled 2013-02-11 (×2): qty 1

## 2013-02-11 MED FILL — Sodium Chloride IV Soln 0.9%: INTRAVENOUS | Qty: 50 | Status: AC

## 2013-02-11 NOTE — Progress Notes (Signed)
DAILY PROGRESS NOTE  Subjective:  No events overnight. Denies any further chest pain.  He is tachycardic today. Echo showed EF 35-40%, expected akinesis of the mid-distal anteroseptal myocardium and apex.   Objective:  Temp:  [98.2 F (36.8 C)-100.3 F (37.9 C)] 99.2 F (37.3 C) (01/30 0741) Pulse Rate:  [72-101] 95 (01/30 0800) Resp:  [11-21] 16 (01/30 0800) BP: (107-145)/(65-102) 127/90 mmHg (01/30 0800) SpO2:  [94 %-100 %] 96 % (01/30 0800) Weight change:   Intake/Output from previous day: 01/29 0701 - 01/30 0700 In: 751.7 [P.O.:440; I.V.:311.7] Out: 4125 [Urine:4125]  Intake/Output from this shift: Total I/O In: 240 [P.O.:240] Out: -   Medications: Current Facility-Administered Medications  Medication Dose Route Frequency Provider Last Rate Last Dose  . 0.9 %  sodium chloride infusion   Intravenous Continuous Kalman Drape, MD   20 mL/hr at 02/10/13 0207  . acetaminophen (TYLENOL) tablet 650 mg  650 mg Oral Q4H PRN Peter M Martinique, MD      . aspirin chewable tablet 81 mg  81 mg Oral Daily Peter M Martinique, MD   81 mg at 02/11/13 0858  . atorvastatin (LIPITOR) tablet 80 mg  80 mg Oral q1800 Peter M Martinique, MD   80 mg at 02/10/13 1812  . heparin injection 5,000 Units  5,000 Units Subcutaneous Q8H Peter M Martinique, MD   5,000 Units at 02/11/13 [id number]  . lisinopril (PRINIVIL,ZESTRIL) tablet 10 mg  10 mg Oral Daily Peter M Martinique, MD   10 mg at 02/11/13 0858  . metoprolol tartrate (LOPRESSOR) tablet 25 mg  25 mg Oral BID Peter M Martinique, MD   25 mg at 02/11/13 0858  . morphine 2 MG/ML injection 2 mg  2 mg Intravenous Q1H PRN Peter M Martinique, MD      . nitroGLYCERIN (NITROSTAT) SL tablet 0.4 mg  0.4 mg Sublingual Q5 min PRN Kalman Drape, MD   0.4 mg at 02/10/13 0159  . nitroGLYCERIN 0.2 mg/mL in dextrose 5 % infusion  5 mcg/min Intravenous Titrated Kalman Drape, MD   15 mcg/min at 02/10/13 0700  . ondansetron (ZOFRAN) injection 4 mg  4 mg Intravenous Q6H PRN Peter M Martinique, MD       . oxyCODONE-acetaminophen (PERCOCET/ROXICET) 5-325 MG per tablet 1-2 tablet  1-2 tablet Oral Q4H PRN Peter M Martinique, MD   1 tablet at 02/10/13 1148  . sodium chloride 0.9 % injection 3 mL  3 mL Intravenous PRN Peter M Martinique, MD   3 mL at 02/10/13 2108  . Ticagrelor (BRILINTA) tablet 90 mg  90 mg Oral BID Peter M Martinique, MD   90 mg at 02/11/13 6803    Physical Exam: General appearance: alert and no distress Neck: no carotid bruit and no JVD Lungs: clear to auscultation bilaterally Heart: regular rate and rhythm, S1, S2 normal, no murmur, click, rub or gallop Abdomen: soft, non-tender; bowel sounds normal; no masses,  no organomegaly Extremities: extremities normal, atraumatic, no cyanosis or edema and TR band in place Pulses: 2+ and symmetric Skin: Skin color, texture, turgor normal. No rashes or lesions Neurologic: Grossly normal Psych: Pleasant, normal mood  Lab Results: Results for orders placed during the hospital encounter of 02/10/13 (from the past 48 hour(s))  APTT     Status: None   Collection Time    02/10/13  2:00 AM      Result Value Range   aPTT 28  24 - 37 seconds  CBC  Status: Abnormal   Collection Time    02/10/13  2:00 AM      Result Value Range   WBC 15.7 (*) 4.0 - 10.5 K/uL   RBC 5.79  4.22 - 5.81 MIL/uL   Hemoglobin 15.3  13.0 - 17.0 g/dL   HCT 46.0  39.0 - 52.0 %   MCV 79.4  78.0 - 100.0 fL   MCH 26.4  26.0 - 34.0 pg   MCHC 33.3  30.0 - 36.0 g/dL   RDW 14.7  11.5 - 15.5 %   Platelets 251  150 - 400 K/uL  COMPREHENSIVE METABOLIC PANEL     Status: Abnormal   Collection Time    02/10/13  2:00 AM      Result Value Range   Sodium 132 (*) 137 - 147 mEq/L   Potassium 6.9 (*) 3.7 - 5.3 mEq/L   Comment: MODERATE HEMOLYSIS     CRITICAL RESULT CALLED TO, READ BACK BY AND VERIFIED WITH:     SPOKE WITH SHELL,A RN [phone] HAMER,N   Chloride 96  96 - 112 mEq/L   CO2 21  19 - 32 mEq/L   Glucose, Bld 137 (*) 70 - 99 mg/dL   BUN 19  6 - 23 mg/dL    Creatinine, Ser 0.91  0.50 - 1.35 mg/dL   Calcium 8.9  8.4 - 10.5 mg/dL   Total Protein 8.9 (*) 6.0 - 8.3 g/dL   Albumin 3.9  3.5 - 5.2 g/dL   AST 50 (*) 0 - 37 U/L   Comment: MODERATE HEMOLYSIS   ALT 26  0 - 53 U/L   Comment: MODERATE HEMOLYSIS   Alkaline Phosphatase 95  39 - 117 U/L   Comment: MODERATE HEMOLYSIS   Total Bilirubin 0.3  0.3 - 1.2 mg/dL   GFR calc non Af Amer >90  >90 mL/min   GFR calc Af Amer >90  >90 mL/min   Comment: (NOTE)     The eGFR has been calculated using the CKD EPI equation.     This calculation has not been validated in all clinical situations.     eGFR's persistently <90 mL/min signify possible Chronic Kidney     Disease.  PROTIME-INR     Status: None   Collection Time    02/10/13  2:00 AM      Result Value Range   Prothrombin Time 14.5  11.6 - 15.2 seconds   INR 1.15  0.00 - 1.49  POCT I-STAT TROPONIN I     Status: None   Collection Time    02/10/13  2:06 AM      Result Value Range   Troponin i, poc 0.07  0.00 - 0.08 ng/mL   Comment 3            Comment: Due to the release kinetics of cTnI,     a negative result within the first hours     of the onset of symptoms does not rule out     myocardial infarction with certainty.     If myocardial infarction is still suspected,     repeat the test at appropriate intervals.  POCT I-STAT, CHEM 8     Status: Abnormal   Collection Time    02/10/13  2:07 AM      Result Value Range   Sodium 136 (*) 137 - 147 mEq/L   Potassium 7.1 (*) 3.7 - 5.3 mEq/L   Chloride 104  96 - 112 mEq/L   BUN 27 (*)  6 - 23 mg/dL   Creatinine, Ser 1.10  0.50 - 1.35 mg/dL   Glucose, Bld 134 (*) 70 - 99 mg/dL   Calcium, Ion 1.01 (*) 1.12 - 1.23 mmol/L   TCO2 25  0 - 100 mmol/L   Hemoglobin 17.0  13.0 - 17.0 g/dL   HCT 50.0  39.0 - 52.0 %   Comment NOTIFIED PHYSICIAN    POCT I-STAT, CHEM 8     Status: Abnormal   Collection Time    02/10/13  2:52 AM      Result Value Range   Sodium 138  137 - 147 mEq/L   Potassium 3.8  3.7  - 5.3 mEq/L   Chloride 104  96 - 112 mEq/L   BUN 18  6 - 23 mg/dL   Creatinine, Ser 0.90  0.50 - 1.35 mg/dL   Glucose, Bld 167 (*) 70 - 99 mg/dL   Calcium, Ion 1.14  1.12 - 1.23 mmol/L   TCO2 19  0 - 100 mmol/L   Hemoglobin 15.0  13.0 - 17.0 g/dL   HCT 44.0  39.0 - 52.0 %  POCT ACTIVATED CLOTTING TIME     Status: None   Collection Time    02/10/13  2:56 AM      Result Value Range   Activated Clotting Time 498    MRSA PCR SCREENING     Status: None   Collection Time    02/10/13  4:18 AM      Result Value Range   MRSA by PCR NEGATIVE  NEGATIVE   Comment:            The GeneXpert MRSA Assay (FDA     approved for NASAL specimens     only), is one component of a     comprehensive MRSA colonization     surveillance program. It is not     intended to diagnose MRSA     infection nor to guide or     monitor treatment for     MRSA infections.  BASIC METABOLIC PANEL     Status: Abnormal   Collection Time    02/10/13  7:05 AM      Result Value Range   Sodium 137  137 - 147 mEq/L   Potassium 4.6  3.7 - 5.3 mEq/L   Chloride 99  96 - 112 mEq/L   CO2 23  19 - 32 mEq/L   Glucose, Bld 126 (*) 70 - 99 mg/dL   BUN 15  6 - 23 mg/dL   Creatinine, Ser 0.88  0.50 - 1.35 mg/dL   Calcium 8.4  8.4 - 10.5 mg/dL   GFR calc non Af Amer >90  >90 mL/min   GFR calc Af Amer >90  >90 mL/min   Comment: (NOTE)     The eGFR has been calculated using the CKD EPI equation.     This calculation has not been validated in all clinical situations.     eGFR's persistently <90 mL/min signify possible Chronic Kidney     Disease.  CBC     Status: Abnormal   Collection Time    02/10/13  7:05 AM      Result Value Range   WBC 13.1 (*) 4.0 - 10.5 K/uL   RBC 5.14  4.22 - 5.81 MIL/uL   Hemoglobin 13.8  13.0 - 17.0 g/dL   HCT 40.5  39.0 - 52.0 %   MCV 78.8  78.0 - 100.0 fL   MCH 26.8  26.0 - 34.0 pg   MCHC 34.1  30.0 - 36.0 g/dL   RDW 14.5  11.5 - 15.5 %   Platelets 206  150 - 400 K/uL  HEMOGLOBIN A1C      Status: Abnormal   Collection Time    02/10/13  7:05 AM      Result Value Range   Hemoglobin A1C 6.1 (*) <5.7 %   Comment: (NOTE)                                                                               According to the ADA Clinical Practice Recommendations for 2011, when     HbA1c is used as a screening test:      >=6.5%   Diagnostic of Diabetes Mellitus               (if abnormal result is confirmed)     5.7-6.4%   Increased risk of developing Diabetes Mellitus     References:Diagnosis and Classification of Diabetes Mellitus,Diabetes     GXQJ,1941,74(YCXKG 1):S62-S69 and Standards of Medical Care in             Diabetes - 2011,Diabetes Care,2011,34 (Suppl 1):S11-S61.   Mean Plasma Glucose 128 (*) <117 mg/dL   Comment: Performed at [facility]  TROPONIN I     Status: Abnormal   Collection Time    02/10/13  5:15 PM      Result Value Range   Troponin I >20.00 (*) <0.30 ng/mL   Comment: CRITICAL RESULT CALLED TO, READ BACK BY AND VERIFIED WITH:     Raynelle Dick 1818 02/10/13 WBOND                Due to the release kinetics of cTnI,     a negative result within the first hours     of the onset of symptoms does not rule out     myocardial infarction with certainty.     If myocardial infarction is still suspected,     repeat the test at appropriate intervals.  TROPONIN I     Status: Abnormal   Collection Time    02/10/13 10:45 PM      Result Value Range   Troponin I >20.00 (*) <0.30 ng/mL   Comment:            Due to the release kinetics of cTnI,     a negative result within the first hours     of the onset of symptoms does not rule out     myocardial infarction with certainty.     If myocardial infarction is still suspected,     repeat the test at appropriate intervals.     CRITICAL VALUE NOTED.  VALUE IS CONSISTENT WITH PREVIOUSLY REPORTED AND CALLED VALUE.  TROPONIN I     Status: Abnormal   Collection Time    02/11/13  3:15 AM      Result Value Range    Troponin I >20.00 (*) <0.30 ng/mL   Comment:            Due to the release kinetics of cTnI,     a negative result within the first hours  of the onset of symptoms does not rule out     myocardial infarction with certainty.     If myocardial infarction is still suspected,     repeat the test at appropriate intervals.     CRITICAL VALUE NOTED.  VALUE IS CONSISTENT WITH PREVIOUSLY REPORTED AND CALLED VALUE.  PRO B NATRIURETIC PEPTIDE     Status: Abnormal   Collection Time    02/11/13  3:15 AM      Result Value Range   Pro B Natriuretic peptide (BNP) 2800.0 (*) 0 - 125 pg/mL  CBC     Status: Abnormal   Collection Time    02/11/13  3:15 AM      Result Value Range   WBC 12.3 (*) 4.0 - 10.5 K/uL   RBC 5.60  4.22 - 5.81 MIL/uL   Hemoglobin 15.0  13.0 - 17.0 g/dL   HCT 44.3  39.0 - 52.0 %   MCV 79.1  78.0 - 100.0 fL   MCH 26.8  26.0 - 34.0 pg   MCHC 33.9  30.0 - 36.0 g/dL   RDW 14.6  11.5 - 15.5 %   Platelets 222  150 - 400 K/uL  BASIC METABOLIC PANEL     Status: Abnormal   Collection Time    02/11/13  3:15 AM      Result Value Range   Sodium 138  137 - 147 mEq/L   Potassium 4.3  3.7 - 5.3 mEq/L   Chloride 99  96 - 112 mEq/L   CO2 22  19 - 32 mEq/L   Glucose, Bld 130 (*) 70 - 99 mg/dL   BUN 12  6 - 23 mg/dL   Creatinine, Ser 0.89  0.50 - 1.35 mg/dL   Calcium 9.2  8.4 - 10.5 mg/dL   GFR calc non Af Amer >90  >90 mL/min   GFR calc Af Amer >90  >90 mL/min   Comment: (NOTE)     The eGFR has been calculated using the CKD EPI equation.     This calculation has not been validated in all clinical situations.     eGFR's persistently <90 mL/min signify possible Chronic Kidney     Disease.    Imaging: Echocardiogram (02/10/13) Study Conclusions  - Left ventricle: The cavity size was normal. Wall thickness was increased in a pattern of mild LVH. Systolic function was moderately reduced. The estimated ejection fraction was in the range of 35% to 40%. Akinesis of  the mid-distalanteroseptal myocardium. Akinesis of the apical myocardium. - Pulmonary arteries: PA peak pressure: 34m Hg (S).  Assessment:  Principal Problem:   ST elevation myocardial infarction (STEMI) of anterior wall Active Problems:   Essential hypertension, benign   Dyslipidemia   Cardiomyopathy, ischemic   Antiplatelet or antithrombotic long-term use   Plan:  1. Troponin is markedly elevated, suggesting he did indeed have a large anterior MI. BNP this am is 2800.  Leukocytosis is resolving. Start lasix 20 mg po daily today,although he does not seem significantly volume overloaded. May need an increase in his b-blocker. Ambulate with cardiac rehab. Can transfer to stepdown later today.   Time Spent Directly with Patient:  15 minutes  Length of Stay:  LOS: 1 day   KPixie Casino MD, FRiverpointe Surgery CenterAttending Cardiologist [facility]  Jahnay Lantier C 02/11/2013, 10:32 AM

## 2013-02-11 NOTE — Progress Notes (Signed)
CARDIAC REHAB PHASE I   PRE:  Rate/Rhythm: 101ST  BP:  Supine:   Sitting: 114/75  Standing:    SaO2:   MODE:  Ambulation: 350 ft   POST:  Rate/Rhythm: 113 ST  BP:  Supine:   Sitting: 119/81  Standing:    SaO2:  1330-1440 Pt walked 350 ft on RA with steady gait. Tolerated well. No CP. C/o legs weak. Heart rate to 113. Education completed with pt and brother. Understanding voiced by pt. Discussed CRP 2 and permission given to refer to [place]. Has stent and brilinta booklets.   Luetta Nutting, RN BSN  02/11/2013 2:34 PM

## 2013-02-12 ENCOUNTER — Telehealth: Payer: Self-pay | Admitting: Cardiology

## 2013-02-12 ENCOUNTER — Encounter (HOSPITAL_COMMUNITY): Payer: Self-pay | Admitting: Physician Assistant

## 2013-02-12 DIAGNOSIS — I2109 ST elevation (STEMI) myocardial infarction involving other coronary artery of anterior wall: Secondary | ICD-10-CM

## 2013-02-12 DIAGNOSIS — I2510 Atherosclerotic heart disease of native coronary artery without angina pectoris: Secondary | ICD-10-CM

## 2013-02-12 LAB — CBC
HCT: 43.9 % (ref 39.0–52.0)
Hemoglobin: 14.8 g/dL (ref 13.0–17.0)
MCH: 26.9 pg (ref 26.0–34.0)
MCHC: 33.7 g/dL (ref 30.0–36.0)
MCV: 79.8 fL (ref 78.0–100.0)
PLATELETS: 221 10*3/uL (ref 150–400)
RBC: 5.5 MIL/uL (ref 4.22–5.81)
RDW: 14.5 % (ref 11.5–15.5)
WBC: 13.2 10*3/uL — AB (ref 4.0–10.5)

## 2013-02-12 LAB — BASIC METABOLIC PANEL
BUN: 23 mg/dL (ref 6–23)
CHLORIDE: 98 meq/L (ref 96–112)
CO2: 23 mEq/L (ref 19–32)
Calcium: 9 mg/dL (ref 8.4–10.5)
Creatinine, Ser: 1.2 mg/dL (ref 0.50–1.35)
GFR calc non Af Amer: 67 mL/min — ABNORMAL LOW (ref >90)
GFR, EST AFRICAN AMERICAN: 78 mL/min — AB (ref >90)
Glucose, Bld: 108 mg/dL — ABNORMAL HIGH (ref 70–99)
POTASSIUM: 4.6 meq/L (ref 3.7–5.3)
SODIUM: 136 meq/L — AB (ref 137–147)

## 2013-02-12 MED ORDER — NITROGLYCERIN 0.4 MG SL SUBL: 0.4000 mg | SUBLINGUAL_TABLET | SUBLINGUAL | Status: DC | PRN

## 2013-02-12 MED ORDER — CARVEDILOL 6.25 MG PO TABS: 6.2500 mg | ORAL_TABLET | Freq: Two times a day (BID) | ORAL | Status: DC

## 2013-02-12 MED ORDER — SPIRONOLACTONE 25 MG PO TABS: 12.5000 mg | ORAL_TABLET | Freq: Every day | ORAL | Status: DC

## 2013-02-12 MED ORDER — TICAGRELOR 90 MG PO TABS: 90.0000 mg | ORAL_TABLET | Freq: Two times a day (BID) | ORAL | Status: DC

## 2013-02-12 MED ORDER — LISINOPRIL 10 MG PO TABS: 10.0000 mg | ORAL_TABLET | Freq: Every day | ORAL | Status: DC

## 2013-02-12 MED ORDER — ASPIRIN EC 81 MG PO TBEC: 81.0000 mg | DELAYED_RELEASE_TABLET | Freq: Every day | ORAL | Status: DC

## 2013-02-12 MED ORDER — ATORVASTATIN CALCIUM 80 MG PO TABS: 80.0000 mg | ORAL_TABLET | Freq: Every day | ORAL | Status: DC

## 2013-02-12 NOTE — Progress Notes (Signed)
Reviewed dc instructions with patient. Patient able to teach back adm. Of nitro sl as this is new medicine for him. Patient understands importance of taking Brilanta and not missing a dose. Pt's states understanding of all meds and dc instructions . Await family.

## 2013-02-12 NOTE — Progress Notes (Signed)
DAILY PROGRESS NOTE  Subjective:    Presented with anterior STEMI  On early am 1/29 and had a long-area of DES placed (3.0 x 38 mm Promus Premier / overlapping 3.5 x 32 mm Promus Element). Echo shows EF 35-40%, expected akinesis of the mid-distal anteroseptal myocardium and apex.   Doing well. No CP. Walking floor. Anxious to go home.  Cath results Left mainstem: 20% at the very distal left main.  Left anterior descending (LAD): 100% proximal.  Ramus intermediate: moderate to large in size. Normal.  Left circumflex (LCx): The LCx gives off 3 OM branches before terminating in the AV groove. Mild irregularities less than 20%.  Right coronary artery (RCA): 100% occlusion in the mid vessel. Left to right collaterals to the distal vessel.  Left ventriculography: Not done  Tele  SR 90. No ectopy.   Objective:  Temp:  [98.7 F (37.1 C)-100.2 F (37.9 C)] 98.9 F (37.2 C) (01/31 0817) Pulse Rate:  [115] 115 (01/30 2057) Resp:  [16-20] 16 (01/30 1557) BP: (99-119)/(63-81) 104/66 mmHg (01/31 0817) SpO2:  [94 %-97 %] 96 % (01/31 0817) Weight change:   Intake/Output from previous day: 01/30 0701 - 01/31 0700 In: 966 [P.O.:960; I.V.:6] Out: 700 [Urine:700]  Intake/Output from this shift:    Medications: Current Facility-Administered Medications  Medication Dose Route Frequency Provider Last Rate Last Dose  . 0.9 %  sodium chloride infusion   Intravenous Continuous Kalman Drape, MD   20 mL/hr at 02/10/13 0207  . acetaminophen (TYLENOL) tablet 650 mg  650 mg Oral Q4H PRN Peter M Martinique, MD      . aspirin chewable tablet 81 mg  81 mg Oral Daily Peter M Martinique, MD   81 mg at 02/12/13 1011  . atorvastatin (LIPITOR) tablet 80 mg  80 mg Oral q1800 Peter M Martinique, MD   80 mg at 02/11/13 1714  . furosemide (LASIX) tablet 20 mg  20 mg Oral Daily Pixie Casino, MD   20 mg at 02/12/13 1011  . heparin injection 5,000 Units  5,000 Units Subcutaneous Q8H Peter M Martinique, MD   5,000 Units at  02/12/13 0500  . lisinopril (PRINIVIL,ZESTRIL) tablet 10 mg  10 mg Oral Daily Peter M Martinique, MD   10 mg at 02/12/13 1011  . metoprolol tartrate (LOPRESSOR) tablet 25 mg  25 mg Oral BID Peter M Martinique, MD   25 mg at 02/12/13 1011  . morphine 2 MG/ML injection 2 mg  2 mg Intravenous Q1H PRN Peter M Martinique, MD      . nitroGLYCERIN (NITROSTAT) SL tablet 0.4 mg  0.4 mg Sublingual Q5 min PRN Kalman Drape, MD   0.4 mg at 02/10/13 0159  . nitroGLYCERIN 0.2 mg/mL in dextrose 5 % infusion  5 mcg/min Intravenous Titrated Kalman Drape, MD   15 mcg/min at 02/10/13 0700  . ondansetron (ZOFRAN) injection 4 mg  4 mg Intravenous Q6H PRN Peter M Martinique, MD      . oxyCODONE-acetaminophen (PERCOCET/ROXICET) 5-325 MG per tablet 1-2 tablet  1-2 tablet Oral Q4H PRN Peter M Martinique, MD   1 tablet at 02/10/13 1148  . sodium chloride 0.9 % injection 3 mL  3 mL Intravenous PRN Peter M Martinique, MD   3 mL at 02/10/13 2108  . Ticagrelor (BRILINTA) tablet 90 mg  90 mg Oral BID Peter M Martinique, MD   90 mg at 02/12/13 1012    Physical Exam: General appearance: alert and no distress Neck:  no carotid bruit and no JVD Lungs: clear to auscultation bilaterally Heart: regular rate and rhythm, S1, S2 normal, no murmur, click, rub or gallop Abdomen: soft, non-tender; bowel sounds normal; no masses,  no organomegaly Extremities: extremities normal, atraumatic, no cyanosis or edema and TR band in place Pulses: 2+ and symmetric Skin: Skin color, texture, turgor normal. No rashes or lesions Neurologic: Grossly normal Psych: Pleasant, normal mood  Lab Results: Results for orders placed during the hospital encounter of 02/10/13 (from the past 48 hour(s))  TROPONIN I     Status: Abnormal   Collection Time    02/10/13  5:15 PM      Result Value Range   Troponin I >20.00 (*) <0.30 ng/mL   Comment: CRITICAL RESULT CALLED TO, READ BACK BY AND VERIFIED WITH:     Raynelle Dick 1818 02/10/13 WBOND                Due to the release  kinetics of cTnI,     a negative result within the first hours     of the onset of symptoms does not rule out     myocardial infarction with certainty.     If myocardial infarction is still suspected,     repeat the test at appropriate intervals.  TROPONIN I     Status: Abnormal   Collection Time    02/10/13 10:45 PM      Result Value Range   Troponin I >20.00 (*) <0.30 ng/mL   Comment:            Due to the release kinetics of cTnI,     a negative result within the first hours     of the onset of symptoms does not rule out     myocardial infarction with certainty.     If myocardial infarction is still suspected,     repeat the test at appropriate intervals.     CRITICAL VALUE NOTED.  VALUE IS CONSISTENT WITH PREVIOUSLY REPORTED AND CALLED VALUE.  TROPONIN I     Status: Abnormal   Collection Time    02/11/13  3:15 AM      Result Value Range   Troponin I >20.00 (*) <0.30 ng/mL   Comment:            Due to the release kinetics of cTnI,     a negative result within the first hours     of the onset of symptoms does not rule out     myocardial infarction with certainty.     If myocardial infarction is still suspected,     repeat the test at appropriate intervals.     CRITICAL VALUE NOTED.  VALUE IS CONSISTENT WITH PREVIOUSLY REPORTED AND CALLED VALUE.  PRO B NATRIURETIC PEPTIDE     Status: Abnormal   Collection Time    02/11/13  3:15 AM      Result Value Range   Pro B Natriuretic peptide (BNP) 2800.0 (*) 0 - 125 pg/mL  CBC     Status: Abnormal   Collection Time    02/11/13  3:15 AM      Result Value Range   WBC 12.3 (*) 4.0 - 10.5 K/uL   RBC 5.60  4.22 - 5.81 MIL/uL   Hemoglobin 15.0  13.0 - 17.0 g/dL   HCT 44.3  39.0 - 52.0 %   MCV 79.1  78.0 - 100.0 fL   MCH 26.8  26.0 - 34.0 pg   MCHC 33.9  30.0 - 36.0 g/dL   RDW 14.6  11.5 - 15.5 %   Platelets 222  150 - 400 K/uL  BASIC METABOLIC PANEL     Status: Abnormal   Collection Time    02/11/13  3:15 AM      Result Value  Range   Sodium 138  137 - 147 mEq/L   Potassium 4.3  3.7 - 5.3 mEq/L   Chloride 99  96 - 112 mEq/L   CO2 22  19 - 32 mEq/L   Glucose, Bld 130 (*) 70 - 99 mg/dL   BUN 12  6 - 23 mg/dL   Creatinine, Ser 0.89  0.50 - 1.35 mg/dL   Calcium 9.2  8.4 - 10.5 mg/dL   GFR calc non Af Amer >90  >90 mL/min   GFR calc Af Amer >90  >90 mL/min   Comment: (NOTE)     The eGFR has been calculated using the CKD EPI equation.     This calculation has not been validated in all clinical situations.     eGFR's persistently <90 mL/min signify possible Chronic Kidney     Disease.  CBC     Status: Abnormal   Collection Time    02/12/13  4:42 AM      Result Value Range   WBC 13.2 (*) 4.0 - 10.5 K/uL   RBC 5.50  4.22 - 5.81 MIL/uL   Hemoglobin 14.8  13.0 - 17.0 g/dL   HCT 43.9  39.0 - 52.0 %   MCV 79.8  78.0 - 100.0 fL   MCH 26.9  26.0 - 34.0 pg   MCHC 33.7  30.0 - 36.0 g/dL   RDW 14.5  11.5 - 15.5 %   Platelets 221  150 - 400 K/uL  BASIC METABOLIC PANEL     Status: Abnormal   Collection Time    02/12/13  4:42 AM      Result Value Range   Sodium 136 (*) 137 - 147 mEq/L   Potassium 4.6  3.7 - 5.3 mEq/L   Chloride 98  96 - 112 mEq/L   CO2 23  19 - 32 mEq/L   Glucose, Bld 108 (*) 70 - 99 mg/dL   BUN 23  6 - 23 mg/dL   Comment: DELTA CHECK NOTED   Creatinine, Ser 1.20  0.50 - 1.35 mg/dL   Calcium 9.0  8.4 - 10.5 mg/dL   GFR calc non Af Amer 67 (*) >90 mL/min   GFR calc Af Amer 78 (*) >90 mL/min   Comment: (NOTE)     The eGFR has been calculated using the CKD EPI equation.     This calculation has not been validated in all clinical situations.     eGFR's persistently <90 mL/min signify possible Chronic Kidney     Disease.    Imaging: Echocardiogram (02/10/13) Study Conclusions  - Left ventricle: The cavity size was normal. Wall thickness was increased in a pattern of mild LVH. Systolic function was moderately reduced. The estimated ejection fraction was in the range of 35% to 40%. Akinesis of  the mid-distalanteroseptal myocardium. Akinesis of the apical myocardium. - Pulmonary arteries: PA peak pressure: 52m Hg (S).  Assessment:  1. Anterior STEMI 1/29 - PCI of LAD 2. 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 Ischemic CM. EF 35-40% 4. HTN 5. Hyperlipidemia 6. Former tobacco use quit x 1 year.  Plan:  Doing well. Will d/c home today. Will need referral to cardiac rehab. Stressed  importance of compliance with DAPT and risk of stent thrombosis and its consequences.  Given the length of his LAD stents, he will need lifelong DAPT therapy. D/c On lipitor 80 mg daily, asa 81, lisinopril 10 mg daily and carvedilol 6.25 mg BID.    Length of Stay:  LOS: 2 days   Glori Bickers MD 02/12/2013, 11:01 AM

## 2013-02-12 NOTE — Discharge Instructions (Signed)
Do not miss a dose of Aspirin or Brilinta. °

## 2013-02-12 NOTE — Progress Notes (Signed)
   CARE MANAGEMENT NOTE 02/12/2013  Patient:  Jason Foster, Jason Foster   Account Number:  [id number]  Date Initiated:  02/10/2013  Documentation initiated by:  Junius Creamer  Subjective/Objective Assessment:   adm w stemi     Action/Plan:   lives w wife, pcp dr Val Eagle jegede   Anticipated DC Date:     Anticipated DC Plan:        DC Planning Services  CM consult  Medication Assistance      Choice offered to / List presented to:             Status of service:  Completed, signed off Medicare Important Message given?   (If response is "NO", the following Medicare IM given date fields will be blank) Date Medicare IM given:   Date Additional Medicare IM given:    Discharge Disposition:  HOME/SELF CARE  Per UR Regulation:  Reviewed for med. necessity/level of care/duration of stay  If discussed at Long Length of Stay Meetings, dates discussed:    Comments:  02/12/13 12:30 CM was called by RN to see if we could provide any further assistance for medications for pt.  Pt is seen by [facility] and has an orange card with [employer] 100% coverage.  Brilinta free trial card will get him his Brilinta and knows of the [employer] 4$ list and states he has many of the meds already and understands he can follow up at the [facility] for prescriptions.  Jason Foster, BSN, Caryl Ada [phone].  1/29 1014a debbie dowell rn,bsn spoke w pt. [facility]. placed brilinta pt assist form on chart for md. gave pt 30day free brilinta card.

## 2013-02-12 NOTE — Discharge Summary (Signed)
Discharge Summary   Patient ID: Jason Foster, MRN: [id number], DOB/AGE: 07-26-1960 53 y.o.  Admit date: 02/10/2013 Discharge date: 02/12/2013   Primary Care Physician:  Jeanann Lewandowsky   Primary Cardiologist:  Dr. Peter Swaziland   Reason for Admission:  Anterior STEMI   Primary Discharge Diagnoses:  Principal Problem:   ST elevation myocardial infarction (STEMI) of anterior wall  - treated with Promus DES x 2 to LAD;  CTO of RCA to be treated medically  Active Problems:   Coronary Artery Disease   Essential hypertension, benign   Dyslipidemia   Cardiomyopathy, ischemic   Antiplatelet or antithrombotic long-term use     Wt Readings from Last 3 Encounters:  02/10/13 202 lb 6.1 oz (91.8 kg)  02/10/13 202 lb 6.1 oz (91.8 kg)  12/30/12 207 lb (93.895 kg)    Secondary Discharge Diagnoses:   Past Medical History  Diagnosis Date  . Hypertension   . CAD (coronary artery disease)     a. Ant STEMI (01/2013):  LHC - dLM 20, pLAD 100 (DES x 2), CFX < 20, mRCA 100% (L-R collats)  . Ischemic cardiomyopathy     a. echo (01/2013):  mild LVH, EF 35-40%, AS and apical AK, PASP 34  . HLD (hyperlipidemia)   . ST elevation (STEMI) myocardial infarction involving left anterior descending coronary artery 01/2013      Allergies:   No Known Allergies    Procedures Performed This Admission:    Cardiac Catheterization and Percutaneous Coronary Intervention 02/10/2013:  Left mainstem: 20% at the very distal left main. Left anterior descending (LAD): 100% proximal.  Ramus intermediate: moderate to large in size. Normal.   Left circumflex (LCx): The LCx gives off 3 OM branches before terminating in the AV groove. Mild irregularities less than 20%. Right coronary artery (RCA): 100% occlusion in the mid vessel. Left to right collaterals to the distal vessel.    Left ventriculography: Not done  PCI Data: Vessel - LAD/Segment - proximal Percent Stenosis (pre)  100% TIMI-flow 0 Stent  3.0 x 38 mm Promus, 3.5 x 32 mm Promus Percent Stenosis (post) 0% TIMI-flow (post) 3  Final Conclusions:   1. Severe 2 vessel obstructive CAD 2. Successful stenting of the proximal to mid LAD with overlapping DES.     Recommendations:  Continue dual antiplatelet therapy indefinitely. Aggressive risk factor modification. Will obtain an Echo to assess LV function. Would treat RCA disease medically. If he has refractory angina could consider CTO intervention in the future. Theron Arista [facility] 02/10/2013, 3:59 AM   Hospital Course:  TRUEN SCHAUL is a 53 y.o. male with a hx of HTN, HL who presented to [facility] ED with sudden onset of chest pain that awoke him from sleep.  ECG demonstrated anterior STE.  He was transferred to [facility] for emergent LHC.  LHC demonstrated acute occlusion of the proximal LAD that was treated with a Promus DES x 2 and chronic occlusion of the RCA with L-R collaterals.  He will need dual antiplatelet Rx indefinitely.  Medical Rx recommended for RCA disease.  If he has refractory angina, CTO intervention could be considered.  Follow up echo confirmed EF 35-40% with anteroseptal and apical akinesis.  He was started on high dose statin, ACEI, beta blocker.   His post PCI course was uneventful.  He remained chest pain free.  He was seen by Dr. Arvilla Meres this AM and felt stable for d/c to home.  He is being placed on Spironolactone 12.5  at d/c.  He will need a BMET in 1 week.  We will set him up for a 1 week TOC follow up visit.    Discharge Vitals:   Blood pressure 104/66, pulse 115, temperature 98.9 F (37.2 C), temperature source Oral, resp. rate 16, height 5\' 7"  (1.702 m), weight 202 lb 6.1 oz (91.8 kg), SpO2 96.00%.   Labs:   Recent Labs  02/10/13 0705 02/11/13 0315 02/12/13 0442  WBC 13.1* 12.3* 13.2*  HGB 13.8 15.0 14.8  HCT 40.5 44.3 43.9  MCV 78.8 79.1 79.8  PLT 206 222 221     Recent Labs  02/10/13 0200  02/10/13 0705 02/11/13 0315  02/12/13 0442  NA 132*  < > 137 138 136*  K 6.9*  < > 4.6 4.3 4.6  CL 96  < > 99 99 98  CO2 21  --  23 22 23   BUN 19  < > 15 12 23   CREATININE 0.91  < > 0.88 0.89 1.20  CALCIUM 8.9  --  8.4 9.2 9.0  PROT 8.9*  --   --   --   --   BILITOT 0.3  --   --   --   --   ALKPHOS 95  --   --   --   --   ALT 26  --   --   --   --   AST 50*  --   --   --   --   < > = values in this interval not displayed.   Recent Labs  02/10/13 1715 02/10/13 2245 02/11/13 0315  TROPONINI >20.00* >20.00* >20.00*     Recent Labs  02/10/13 0200  INR 1.15    Lab Results  Component Value Date   HGBA1C 6.1* 02/10/2013     Diagnostic Procedures and Studies:  No results found.   2D Echocardiogram 02/10/2013:  - Left ventricle: The cavity size was normal. Wall thickness   was increased in a pattern of mild LVH. Systolic function   was moderately reduced. The estimated ejection fraction   was in the range of 35% to 40%. Akinesis of the   mid-distalanteroseptal myocardium. Akinesis of the apical   myocardium. - Pulmonary arteries: PA peak pressure: 34mm Hg (S).   Disposition:   Pt is being discharged home today in good condition.  Follow-up Plans & Appointments      Follow-up Information   Follow up with Peter Swaziland, MD In 1 week. (office will call for appointment)    Specialty:  Cardiology   Contact information:   [address] [place] [address] [phone]       Follow up with Jeanann Lewandowsky, MD. (please call for appointment)    Specialty:  Internal Medicine   Contact information:   [address] [place] [address] [phone]       Discharge Medications    Medication List    STOP taking these medications       lisinopril-hydrochlorothiazide 20-25 MG per tablet  Commonly known as:  PRINZIDE,ZESTORETIC      TAKE these medications       aspirin EC 81 MG tablet  Take 1 tablet (81 mg total) by mouth daily.     atorvastatin 80 MG tablet    Commonly known as:  LIPITOR  Take 1 tablet (80 mg total) by mouth daily at 6 PM.     carvedilol 6.25 MG tablet  Commonly known as:  COREG  Take 1 tablet (6.25 mg total) by mouth 2 (two) times daily with a meal.     lisinopril 10 MG tablet  Commonly known as:  PRINIVIL,ZESTRIL  Take 1 tablet (10 mg total) by mouth daily.     nitroGLYCERIN 0.4 MG SL tablet  Commonly known as:  NITROSTAT  Place 1 tablet (0.4 mg total) under the tongue every 5 (five) minutes as needed for chest pain.     spironolactone 25 MG tablet  Commonly known as:  ALDACTONE  Take 0.5 tablets (12.5 mg total) by mouth daily.     Ticagrelor 90 MG Tabs tablet  Commonly known as:  BRILINTA  Take 1 tablet (90 mg total) by mouth 2 (two) times daily.         Outstanding Labs/Studies  1. BMET at follow up visit in 1 week. Check Lipids and LFTs in 6 weeks.    Duration of Discharge Encounter: Greater than 30 minutes including physician and PA time.  Signed, Tereso NewcomerScott Javonni Macke, PA-C   02/12/2013 12:11 PM

## 2013-02-12 NOTE — Telephone Encounter (Signed)
Called by [facility], patient states he will get meds free but can't from Rx.   Changed Brilinta to 60 tablet due to requirement of Brilinta Card for at least 1 months supply, was written as 14 days.  He will get his Brlinta free.   Pt will get other meds through $4 program. He understands the importance of taking all meds especially asa and Brilinta every day not missing a dose.    Haydee Salter, MD

## 2013-02-12 NOTE — Discharge Summary (Signed)
Agreed. See daily rounding note for further details.  Daniel Bensimhon,MD 3:59 PM 

## 2013-02-14 ENCOUNTER — Telehealth: Payer: Self-pay | Admitting: Cardiology

## 2013-02-14 LAB — POCT I-STAT, CHEM 8
BUN: 27 mg/dL — ABNORMAL HIGH (ref 6–23)
CALCIUM ION: 1.01 mmol/L — AB (ref 1.12–1.23)
CREATININE: 1.1 mg/dL (ref 0.50–1.35)
Chloride: 104 mEq/L (ref 96–112)
GLUCOSE: 134 mg/dL — AB (ref 70–99)
HEMATOCRIT: 50 % (ref 39.0–52.0)
Hemoglobin: 17 g/dL (ref 13.0–17.0)
POTASSIUM: 7.1 meq/L — AB (ref 3.7–5.3)
Sodium: 136 mEq/L — ABNORMAL LOW (ref 137–147)
TCO2: 25 mmol/L (ref 0–100)

## 2013-02-14 NOTE — Telephone Encounter (Signed)
Left message to call back this afternoon or anytime tomorrow.

## 2013-02-14 NOTE — Telephone Encounter (Signed)
New message    Per after hours Pt is TOC made appt with Dr. Swaziland for 02/5 @ 2pm. Called pt to confirm appt. Pt requests that we text him this appt,because he was laying in the bed. Advised pt that we did not have a means to text him a confirmation of this appt// Pt requests that we "forget the appointment all together because we could not text him the information". Kept the appt scheduled for pt will cancel if needed. Please assist.

## 2013-02-14 NOTE — Telephone Encounter (Signed)
F/u ° ° °Pt returning your call °

## 2013-02-14 NOTE — Telephone Encounter (Signed)
Patient contacted regarding discharge from [facility] on 02/12/2013. Patient understands to follow up with Dr.Jordan at 2 pm on 02/17/2013 at [facility] Patient understands discharge instructions. Patient understands medications and regiment. Reviewed all medications for compliance. Patient understands to bring all medications to this visit.

## 2013-02-16 ENCOUNTER — Telehealth: Payer: Self-pay | Admitting: *Deleted

## 2013-02-16 NOTE — Telephone Encounter (Signed)
Faxed cardiac rehab orders 

## 2013-02-17 ENCOUNTER — Ambulatory Visit (INDEPENDENT_AMBULATORY_CARE_PROVIDER_SITE_OTHER): Payer: No Typology Code available for payment source | Admitting: Cardiology

## 2013-02-17 ENCOUNTER — Encounter: Payer: Self-pay | Admitting: Cardiology

## 2013-02-17 VITALS — BP 123/70 | HR 82 | Ht 67.0 in | Wt 199.0 lb

## 2013-02-17 DIAGNOSIS — E785 Hyperlipidemia, unspecified: Secondary | ICD-10-CM

## 2013-02-17 DIAGNOSIS — I2589 Other forms of chronic ischemic heart disease: Secondary | ICD-10-CM

## 2013-02-17 DIAGNOSIS — I255 Ischemic cardiomyopathy: Secondary | ICD-10-CM

## 2013-02-17 DIAGNOSIS — I2109 ST elevation (STEMI) myocardial infarction involving other coronary artery of anterior wall: Secondary | ICD-10-CM

## 2013-02-17 DIAGNOSIS — I10 Essential (primary) hypertension: Secondary | ICD-10-CM

## 2013-02-17 DIAGNOSIS — I2510 Atherosclerotic heart disease of native coronary artery without angina pectoris: Secondary | ICD-10-CM

## 2013-02-17 NOTE — Progress Notes (Signed)
Jason Foster Date of Birth: January 26, 1960 Medical Record #[id number]  History of Present Illness: Jason Foster is seen for follow up after recent hospitalization for an anterior STEMI. He has a history of HTN and hyperlipidemia. He is a former smoker- quit one year ago. On emergent cardiac cath he had Occlusion of the proximal LAD. The RCA was also occluded in the mid vessel but this appeared chronic and was collateralized. EF was 35-40%. On follow up today he is feeling very well. No chest pain or SOB. No edema or palpitations.   Current Outpatient Prescriptions on File Prior to Visit  Medication Sig Dispense Refill  . aspirin EC 81 MG tablet Take 1 tablet (81 mg total) by mouth daily.  30 tablet  11  . atorvastatin (LIPITOR) 80 MG tablet Take 1 tablet (80 mg total) by mouth daily at 6 PM.  30 tablet  11  . carvedilol (COREG) 6.25 MG tablet Take 1 tablet (6.25 mg total) by mouth 2 (two) times daily with a meal.  60 tablet  11  . lisinopril (PRINIVIL,ZESTRIL) 10 MG tablet Take 1 tablet (10 mg total) by mouth daily.  30 tablet  11  . nitroGLYCERIN (NITROSTAT) 0.4 MG SL tablet Place 1 tablet (0.4 mg total) under the tongue every 5 (five) minutes as needed for chest pain.  25 tablet  12  . spironolactone (ALDACTONE) 25 MG tablet Take 0.5 tablets (12.5 mg total) by mouth daily.  15 tablet  5  . Ticagrelor (BRILINTA) 90 MG TABS tablet Take 1 tablet (90 mg total) by mouth 2 (two) times daily.  60 tablet  11   No current facility-administered medications on file prior to visit.    No Known Allergies  Past Medical History  Diagnosis Date  . Hypertension   . CAD (coronary artery disease)     a. Ant STEMI (01/2013):  LHC - dLM 20, pLAD 100 (DES x 2), CFX < 20, mRCA 100% (L-R collats)  . Ischemic cardiomyopathy     a. echo (01/2013):  mild LVH, EF 35-40%, AS and apical AK, PASP 34  . HLD (hyperlipidemia)   . ST elevation (STEMI) myocardial infarction involving left anterior descending coronary  artery 01/2013    History reviewed. No pertinent past surgical history.  History  Smoking status  . Former Smoker  . Quit date: 02/18/2012  Smokeless tobacco  . Former Neurosurgeon  . Quit date: 11/30/2011    History  Alcohol Use No    History reviewed. No pertinent family history.  Review of Systems: As noted in HPI.  All other systems were reviewed and are negative.  Physical Exam: BP 123/70  Pulse 82  Ht 5\' 7"  (1.702 m)  Wt 199 lb (90.266 kg)  BMI 31.16 kg/m2 [name] male in NAD HEENT: [place]/AT, PERRL, EOMI, sclera are clear. Oropharynx is clear.  Neck: no JVD, bruits, adenopathy Lungs: clear CV: RRR, normal S1-2, No murmur or gallop Abdomen: soft, NT, BS+, no masses or bruits. Ext: no edema. Radial cath site without hematoma. Pulses 2+ Neuro: alert, oriented x 3. Nonfocal.  LABORATORY DATA:   Assessment / Plan: 1. Acute anterior STEMI. S/p DES x 2 to the proximal>>mid LAD. Continue dual antiplatelet therapy for at least one year. May want to consider long term Plavix. Will treat chronic total occlusion of RCA medically. Encourage Cardiac Rehab stage 2.   2. Ischemic cardiomyopathy. EF 35-40%. Continue current ACEi, Coreg, and aldactone. Repeat Echo 3 months.  3. HTN controlled.  4. Hyperlipidemia on high dose statin. Check fasting lab work on next OV in one month.

## 2013-02-17 NOTE — Patient Instructions (Signed)
Continue your current therapy  Gradually increase your activity and I encourage you to do Cardiac Rehab.  We will see you in one month.

## 2013-02-21 ENCOUNTER — Other Ambulatory Visit: Payer: Self-pay | Admitting: Internal Medicine

## 2013-02-21 MED ORDER — TICAGRELOR 90 MG PO TABS: 90.0000 mg | ORAL_TABLET | Freq: Two times a day (BID) | ORAL | Status: DC

## 2013-02-24 ENCOUNTER — Ambulatory Visit (HOSPITAL_COMMUNITY): Payer: No Typology Code available for payment source

## 2013-02-28 ENCOUNTER — Ambulatory Visit (HOSPITAL_COMMUNITY): Payer: No Typology Code available for payment source

## 2013-03-02 ENCOUNTER — Ambulatory Visit (HOSPITAL_COMMUNITY): Payer: No Typology Code available for payment source

## 2013-03-04 ENCOUNTER — Ambulatory Visit (HOSPITAL_COMMUNITY): Payer: No Typology Code available for payment source

## 2013-03-07 ENCOUNTER — Ambulatory Visit (HOSPITAL_COMMUNITY): Payer: No Typology Code available for payment source

## 2013-03-09 ENCOUNTER — Ambulatory Visit (HOSPITAL_COMMUNITY): Payer: No Typology Code available for payment source

## 2013-03-11 ENCOUNTER — Ambulatory Visit (HOSPITAL_COMMUNITY): Payer: No Typology Code available for payment source

## 2013-03-11 ENCOUNTER — Other Ambulatory Visit: Payer: Self-pay | Admitting: Physician Assistant

## 2013-03-14 ENCOUNTER — Ambulatory Visit (HOSPITAL_COMMUNITY): Payer: No Typology Code available for payment source

## 2013-03-16 ENCOUNTER — Ambulatory Visit (HOSPITAL_COMMUNITY): Payer: No Typology Code available for payment source

## 2013-03-17 ENCOUNTER — Ambulatory Visit (INDEPENDENT_AMBULATORY_CARE_PROVIDER_SITE_OTHER): Payer: No Typology Code available for payment source | Admitting: Cardiology

## 2013-03-17 ENCOUNTER — Encounter: Payer: Self-pay | Admitting: Cardiology

## 2013-03-17 VITALS — BP 118/80 | HR 76 | Ht 67.0 in | Wt 194.1 lb

## 2013-03-17 DIAGNOSIS — I10 Essential (primary) hypertension: Secondary | ICD-10-CM

## 2013-03-17 DIAGNOSIS — E785 Hyperlipidemia, unspecified: Secondary | ICD-10-CM

## 2013-03-17 DIAGNOSIS — I2109 ST elevation (STEMI) myocardial infarction involving other coronary artery of anterior wall: Secondary | ICD-10-CM

## 2013-03-17 DIAGNOSIS — I255 Ischemic cardiomyopathy: Secondary | ICD-10-CM

## 2013-03-17 DIAGNOSIS — I2589 Other forms of chronic ischemic heart disease: Secondary | ICD-10-CM

## 2013-03-17 LAB — HEPATIC FUNCTION PANEL
ALBUMIN: 3.7 g/dL (ref 3.5–5.2)
ALK PHOS: 86 U/L (ref 39–117)
ALT: 25 U/L (ref 0–53)
AST: 20 U/L (ref 0–37)
BILIRUBIN DIRECT: 0 mg/dL (ref 0.0–0.3)
TOTAL PROTEIN: 7.9 g/dL (ref 6.0–8.3)
Total Bilirubin: 0.6 mg/dL (ref 0.3–1.2)

## 2013-03-17 LAB — BASIC METABOLIC PANEL
BUN: 17 mg/dL (ref 6–23)
CO2: 26 mEq/L (ref 19–32)
Calcium: 9.4 mg/dL (ref 8.4–10.5)
Chloride: 99 mEq/L (ref 96–112)
Creatinine, Ser: 1 mg/dL (ref 0.4–1.5)
GFR: 84.48 mL/min (ref >60.00)
Glucose, Bld: 95 mg/dL (ref 70–99)
Potassium: 4 mEq/L (ref 3.5–5.1)
Sodium: 132 mEq/L — ABNORMAL LOW (ref 135–145)

## 2013-03-17 LAB — LIPID PANEL
CHOLESTEROL: 113 mg/dL (ref 0–200)
HDL: 22.4 mg/dL — ABNORMAL LOW (ref >39.00)
LDL Cholesterol: 34 mg/dL (ref 0–99)
TRIGLYCERIDES: 283 mg/dL — AB (ref 0.0–149.0)
Total CHOL/HDL Ratio: 5
VLDL: 56.6 mg/dL — AB (ref 0.0–40.0)

## 2013-03-17 MED ORDER — LISINOPRIL 20 MG PO TABS: 20.0000 mg | ORAL_TABLET | Freq: Every day | ORAL | Status: DC

## 2013-03-17 MED ORDER — SILDENAFIL CITRATE 50 MG PO TABS: 50.0000 mg | ORAL_TABLET | Freq: Every day | ORAL | Status: DC | PRN

## 2013-03-17 NOTE — Progress Notes (Signed)
Valora CorporalMohamed A Tyson Date of Birth: 06/08/1960 Medical Record #[id number]  History of Present Illness: [name] is seen for follow up. He is s/p anterior STEMI on 02/10/13 treated with DES x 2 of the LAD. ( 3.0x 38 mm Promus and 3.5 x 32 Promus) He has a history of HTN and hyperlipidemia. He is a former smoker- quit one year ago. On emergent cardiac cath he had occlusion of the proximal LAD. The RCA was also occluded in the mid vessel but this appeared chronic and was collateralized. EF was 35-40%. On follow up today he is feeling very well. No chest pain or SOB. No edema or palpitations. He is walking 30 minutes a day. He brought his medications with him today. He has 2 bottles of lisinopril 10 mg and one bottle of lisinopril HCT. He states he is taking all this but the first 2 bottles are empty.  Current Outpatient Prescriptions on File Prior to Visit  Medication Sig Dispense Refill  . aspirin EC 81 MG tablet Take 1 tablet (81 mg total) by mouth daily.  30 tablet  11  . atorvastatin (LIPITOR) 80 MG tablet Take 1 tablet (80 mg total) by mouth daily at 6 PM.  30 tablet  11  . carvedilol (COREG) 6.25 MG tablet Take 1 tablet (6.25 mg total) by mouth 2 (two) times daily with a meal.  60 tablet  11  . nitroGLYCERIN (NITROSTAT) 0.4 MG SL tablet Place 1 tablet (0.4 mg total) under the tongue every 5 (five) minutes as needed for chest pain.  25 tablet  12  . spironolactone (ALDACTONE) 25 MG tablet Take 0.5 tablets (12.5 mg total) by mouth daily.  15 tablet  5  . Ticagrelor (BRILINTA) 90 MG TABS tablet Take 1 tablet (90 mg total) by mouth 2 (two) times daily.  180 tablet  3   No current facility-administered medications on file prior to visit.    No Known Allergies  Past Medical History  Diagnosis Date  . Hypertension   . CAD (coronary artery disease)     a. Ant STEMI (01/2013):  LHC - dLM 20, pLAD 100 (DES x 2), CFX < 20, mRCA 100% (L-R collats)  . Ischemic cardiomyopathy     a. echo (01/2013):  mild  LVH, EF 35-40%, AS and apical AK, PASP 34  . HLD (hyperlipidemia)   . ST elevation (STEMI) myocardial infarction involving left anterior descending coronary artery 01/2013    History reviewed. No pertinent past surgical history.  History  Smoking status  . Former Smoker  . Quit date: 02/18/2012  Smokeless tobacco  . Former NeurosurgeonUser  . Quit date: 11/30/2011    History  Alcohol Use No    History reviewed. No pertinent family history.  Review of Systems: As noted in HPI.  All other systems were reviewed and are negative.  Physical Exam: BP 118/80  Pulse 76  Ht 5\' 7"  (1.702 m)  Wt 194 lb 1.9 oz (88.052 kg)  BMI 30.40 kg/m2 [place] male in NAD HEENT: [place]/AT, PERRL, EOMI, sclera are clear. Oropharynx is clear.  Neck: no JVD, bruits, adenopathy Lungs: clear CV: RRR, normal S1-2, No murmur or gallop Abdomen: soft, NT, BS+, no masses or bruits. Ext: no edema. Radial cath site without hematoma. Pulses 2+ Neuro: alert, oriented x 3. Nonfocal.  LABORATORY DATA:   Assessment / Plan: 1. Acute anterior STEMI. S/p DES x 2 to the proximal>>mid LAD. Continue dual antiplatelet therapy for at least one year. May  want to consider long term Plavix given the extensive amount of stents used. Will treat chronic total occlusion of RCA medically. Continue exercise program. I will follow up in 4 months.  2. Ischemic cardiomyopathy. EF 35-40%. Will change lisinopril to 20 mg daily and stop lisinopril HCT. Continue other meds. Will arrange follow up Echo in 2 months.  3. HTN controlled.  4. Hyperlipidemia on high dose statin. Will check fasting lab work today.

## 2013-03-17 NOTE — Patient Instructions (Signed)
Stop taking lisinopril- HCT  We will increase lisinopril to 20 mg daily  Continue your other medication  I will see you in 4 months.  We will check an ultrasound of your heart in 2 months.

## 2013-03-18 ENCOUNTER — Ambulatory Visit (HOSPITAL_COMMUNITY): Payer: No Typology Code available for payment source

## 2013-03-21 ENCOUNTER — Ambulatory Visit (HOSPITAL_COMMUNITY): Payer: No Typology Code available for payment source

## 2013-03-23 ENCOUNTER — Ambulatory Visit (HOSPITAL_COMMUNITY): Payer: No Typology Code available for payment source

## 2013-03-25 ENCOUNTER — Ambulatory Visit (HOSPITAL_COMMUNITY): Payer: No Typology Code available for payment source

## 2013-03-28 ENCOUNTER — Ambulatory Visit (HOSPITAL_COMMUNITY): Payer: No Typology Code available for payment source

## 2013-03-30 ENCOUNTER — Ambulatory Visit (HOSPITAL_COMMUNITY): Payer: No Typology Code available for payment source

## 2013-03-31 ENCOUNTER — Ambulatory Visit: Payer: No Typology Code available for payment source | Attending: Internal Medicine | Admitting: Internal Medicine

## 2013-03-31 VITALS — BP 112/77 | HR 74 | Temp 98.6°F | Resp 16 | Ht 67.0 in | Wt 195.0 lb

## 2013-03-31 DIAGNOSIS — I2109 ST elevation (STEMI) myocardial infarction involving other coronary artery of anterior wall: Secondary | ICD-10-CM | POA: Insufficient documentation

## 2013-03-31 DIAGNOSIS — I2510 Atherosclerotic heart disease of native coronary artery without angina pectoris: Secondary | ICD-10-CM | POA: Insufficient documentation

## 2013-03-31 DIAGNOSIS — I10 Essential (primary) hypertension: Secondary | ICD-10-CM | POA: Insufficient documentation

## 2013-03-31 DIAGNOSIS — Z87891 Personal history of nicotine dependence: Secondary | ICD-10-CM | POA: Insufficient documentation

## 2013-03-31 DIAGNOSIS — I25810 Atherosclerosis of coronary artery bypass graft(s) without angina pectoris: Secondary | ICD-10-CM

## 2013-03-31 DIAGNOSIS — E785 Hyperlipidemia, unspecified: Secondary | ICD-10-CM | POA: Insufficient documentation

## 2013-03-31 DIAGNOSIS — I428 Other cardiomyopathies: Secondary | ICD-10-CM | POA: Insufficient documentation

## 2013-03-31 MED ORDER — ASPIRIN EC 81 MG PO TBEC: 81.0000 mg | DELAYED_RELEASE_TABLET | Freq: Every day | ORAL | Status: DC

## 2013-03-31 MED ORDER — LISINOPRIL 20 MG PO TABS: 20.0000 mg | ORAL_TABLET | Freq: Every day | ORAL | Status: DC

## 2013-03-31 MED ORDER — CARVEDILOL 6.25 MG PO TABS: 6.2500 mg | ORAL_TABLET | Freq: Two times a day (BID) | ORAL | Status: DC

## 2013-03-31 MED ORDER — ATORVASTATIN CALCIUM 80 MG PO TABS: 80.0000 mg | ORAL_TABLET | Freq: Every day | ORAL | Status: DC

## 2013-03-31 MED ORDER — SILDENAFIL CITRATE 50 MG PO TABS: 50.0000 mg | ORAL_TABLET | Freq: Every day | ORAL | Status: DC | PRN

## 2013-03-31 MED ORDER — TICAGRELOR 90 MG PO TABS: 90.0000 mg | ORAL_TABLET | Freq: Two times a day (BID) | ORAL | Status: DC

## 2013-03-31 NOTE — Progress Notes (Signed)
Pt is here following up on his HTN and high cholesterol. Pt needs a rx for Viagra, pt needs to have a colonoscopy

## 2013-03-31 NOTE — Progress Notes (Signed)
Patient ID: Jason Foster, male   DOB: 07/20/1960, 53 y.o.   MRN: [id number]   CC:  HPI: [name] is seen for follow up. He is s/p anterior STEMI on 02/10/13 treated with DES x 2 of the LAD. ( 3.0x 38 mm Promus and 3.5 x 32 Promus) He has a history of HTN and hyperlipidemia. He is a former smoker- quit one year ago. On emergent cardiac cath he had occlusion of the proximal LAD. The RCA was also occluded in the mid vessel but this appeared chronic and was collateralized. EF was 35-40% Currently on dual antiplatelet therapy denies any chest pain or shortness of breath. Requesting routine colonoscopy referral as well as prescription for Viagra  No Known Allergies Past Medical History  Diagnosis Date  . Hypertension   . CAD (coronary artery disease)     a. Ant STEMI (01/2013):  LHC - dLM 20, pLAD 100 (DES x 2), CFX < 20, mRCA 100% (L-R collats)  . Ischemic cardiomyopathy     a. echo (01/2013):  mild LVH, EF 35-40%, AS and apical AK, PASP 34  . HLD (hyperlipidemia)   . ST elevation (STEMI) myocardial infarction involving left anterior descending coronary artery 01/2013   Current Outpatient Prescriptions on File Prior to Visit  Medication Sig Dispense Refill  . nitroGLYCERIN (NITROSTAT) 0.4 MG SL tablet Place 1 tablet (0.4 mg total) under the tongue every 5 (five) minutes as needed for chest pain.  25 tablet  12  . spironolactone (ALDACTONE) 25 MG tablet Take 0.5 tablets (12.5 mg total) by mouth daily.  15 tablet  5   No current facility-administered medications on file prior to visit.   History reviewed. No pertinent family history. History   Social History  . Marital Status: Married    Spouse Name: N/A    Number of Children: N/A  . Years of Education: N/A   Occupational History  . Not on file.   Social History Main Topics  . Smoking status: Former Smoker    Quit date: 02/18/2012  . Smokeless tobacco: Former NeurosurgeonUser    Quit date: 11/30/2011  . Alcohol Use: No  . Drug Use: No  .  Sexual Activity: Yes   Other Topics Concern  . Not on file   Social History Narrative  . No narrative on file    Review of Systems  Constitutional: Negative for fever, chills, diaphoresis, activity change, appetite change and fatigue.  HENT: Negative for ear pain, nosebleeds, congestion, facial swelling, rhinorrhea, neck pain, neck stiffness and ear discharge.   Eyes: Negative for pain, discharge, redness, itching and visual disturbance.  Respiratory: Negative for cough, choking, chest tightness, shortness of breath, wheezing and stridor.   Cardiovascular: Negative for chest pain, palpitations and leg swelling.  Gastrointestinal: Negative for abdominal distention.  Genitourinary: Negative for dysuria, urgency, frequency, hematuria, flank pain, decreased urine volume, difficulty urinating and dyspareunia.  Musculoskeletal: Negative for back pain, joint swelling, arthralgias and gait problem.  Neurological: Negative for dizziness, tremors, seizures, syncope, facial asymmetry, speech difficulty, weakness, light-headedness, numbness and headaches.  Hematological: Negative for adenopathy. Does not bruise/bleed easily.  Psychiatric/Behavioral: Negative for hallucinations, behavioral problems, confusion, dysphoric mood, decreased concentration and agitation.    Objective:   Filed Vitals:   03/31/13 1538  BP: 112/77  Pulse: 74  Temp: 98.6 F (37 C)  Resp: 16    Physical Exam  Constitutional: Appears well-developed and well-nourished. No distress.  HENT: Normocephalic. External right and left ear normal. Oropharynx is  clear and moist.  Eyes: Conjunctivae and EOM are normal. PERRLA, no scleral icterus.  Neck: Normal ROM. Neck supple. No JVD. No tracheal deviation. No thyromegaly.  CVS: RRR, S1/S2 +, no murmurs, no gallops, no carotid bruit.  Pulmonary: Effort and breath sounds normal, no stridor, rhonchi, wheezes, rales.  Abdominal: Soft. BS +,  no distension, tenderness, rebound or  guarding.  Musculoskeletal: Normal range of motion. No edema and no tenderness.  Lymphadenopathy: No lymphadenopathy noted, cervical, inguinal. Neuro: Alert. Normal reflexes, muscle tone coordination. No cranial nerve deficit. Skin: Skin is warm and dry. No rash noted. Not diaphoretic. No erythema. No pallor.  Psychiatric: Normal mood and affect. Behavior, judgment, thought content normal.   Lab Results  Component Value Date   WBC 13.2* 02/12/2013   HGB 14.8 02/12/2013   HCT 43.9 02/12/2013   MCV 79.8 02/12/2013   PLT 221 02/12/2013   Lab Results  Component Value Date   CREATININE 1.0 03/17/2013   BUN 17 03/17/2013   NA 132* 03/17/2013   K 4.0 03/17/2013   CL 99 03/17/2013   CO2 26 03/17/2013    Lab Results  Component Value Date   HGBA1C 6.1* 02/10/2013   Lipid Panel     Component Value Date/Time   CHOL 113 03/17/2013 1143   TRIG 283.0* 03/17/2013 1143   HDL 22.40* 03/17/2013 1143   CHOLHDL 5 03/17/2013 1143   VLDL 56.6* 03/17/2013 1143   LDLCALC 34 03/17/2013 1143       Assessment and plan:   Patient Active Problem List   Diagnosis Date Noted  . Coronary atherosclerosis of native coronary artery 02/12/2013  . ST elevation myocardial infarction (STEMI) of anterior wall 02/10/2013  . Cardiomyopathy, ischemic 02/10/2013  . Antiplatelet or antithrombotic long-term use 02/10/2013  . Dyslipidemia 12/30/2012  . Essential hypertension, benign 11/29/2012   Erectile dysfunction Last prescription provided by his cardiologist He wants his primary care doctor to refill for the prescriptions for Viagra We'll prescribe 10 tablets with 3 refills   Coronary artery disease Continue dual antiplatelet therapy Followup in cardiology in 2 more months   Dyslipidemia last cholesterol panel 03/17/13 Continue statin  Follow up with [place] in 3 months    GI referral for routine colonoscopy  The patient was given clear instructions to go to ER or return to medical center if symptoms don't improve, worsen or  new problems develop. The patient verbalized understanding. The patient was told to call to get any lab results if not heard anything in the next week.

## 2013-04-01 ENCOUNTER — Ambulatory Visit (HOSPITAL_COMMUNITY): Payer: No Typology Code available for payment source

## 2013-04-01 ENCOUNTER — Encounter: Payer: Self-pay | Admitting: Internal Medicine

## 2013-04-04 ENCOUNTER — Encounter: Payer: Self-pay | Admitting: Cardiology

## 2013-04-04 ENCOUNTER — Ambulatory Visit (HOSPITAL_COMMUNITY): Payer: No Typology Code available for payment source | Attending: Cardiology | Admitting: Radiology

## 2013-04-04 ENCOUNTER — Ambulatory Visit (HOSPITAL_COMMUNITY): Payer: No Typology Code available for payment source

## 2013-04-04 DIAGNOSIS — I252 Old myocardial infarction: Secondary | ICD-10-CM | POA: Insufficient documentation

## 2013-04-04 DIAGNOSIS — I2109 ST elevation (STEMI) myocardial infarction involving other coronary artery of anterior wall: Secondary | ICD-10-CM

## 2013-04-04 DIAGNOSIS — E785 Hyperlipidemia, unspecified: Secondary | ICD-10-CM

## 2013-04-04 DIAGNOSIS — I2510 Atherosclerotic heart disease of native coronary artery without angina pectoris: Secondary | ICD-10-CM

## 2013-04-04 DIAGNOSIS — I10 Essential (primary) hypertension: Secondary | ICD-10-CM

## 2013-04-04 DIAGNOSIS — I2589 Other forms of chronic ischemic heart disease: Secondary | ICD-10-CM | POA: Insufficient documentation

## 2013-04-04 DIAGNOSIS — I255 Ischemic cardiomyopathy: Secondary | ICD-10-CM

## 2013-04-04 NOTE — Progress Notes (Signed)
Echocardiogram performed.  

## 2013-04-06 ENCOUNTER — Ambulatory Visit (HOSPITAL_COMMUNITY): Payer: No Typology Code available for payment source

## 2013-04-06 ENCOUNTER — Other Ambulatory Visit: Payer: Self-pay | Admitting: Internal Medicine

## 2013-04-06 MED ORDER — SILDENAFIL CITRATE 50 MG PO TABS: 50.0000 mg | ORAL_TABLET | Freq: Every day | ORAL | Status: DC | PRN

## 2013-04-08 ENCOUNTER — Ambulatory Visit (HOSPITAL_COMMUNITY): Payer: No Typology Code available for payment source

## 2013-04-11 ENCOUNTER — Ambulatory Visit (HOSPITAL_COMMUNITY): Payer: No Typology Code available for payment source

## 2013-04-13 ENCOUNTER — Ambulatory Visit (HOSPITAL_COMMUNITY): Payer: No Typology Code available for payment source

## 2013-04-15 ENCOUNTER — Ambulatory Visit (HOSPITAL_COMMUNITY): Payer: No Typology Code available for payment source

## 2013-04-18 ENCOUNTER — Ambulatory Visit (HOSPITAL_COMMUNITY): Payer: No Typology Code available for payment source

## 2013-04-20 ENCOUNTER — Ambulatory Visit (HOSPITAL_COMMUNITY): Payer: No Typology Code available for payment source

## 2013-04-22 ENCOUNTER — Ambulatory Visit (HOSPITAL_COMMUNITY): Payer: No Typology Code available for payment source

## 2013-04-25 ENCOUNTER — Ambulatory Visit (HOSPITAL_COMMUNITY): Payer: No Typology Code available for payment source

## 2013-04-27 ENCOUNTER — Ambulatory Visit (HOSPITAL_COMMUNITY): Payer: No Typology Code available for payment source

## 2013-04-29 ENCOUNTER — Ambulatory Visit (HOSPITAL_COMMUNITY): Payer: No Typology Code available for payment source

## 2013-05-02 ENCOUNTER — Ambulatory Visit (HOSPITAL_COMMUNITY): Payer: No Typology Code available for payment source

## 2013-05-03 ENCOUNTER — Telehealth: Payer: Self-pay | Admitting: *Deleted

## 2013-05-03 NOTE — Telephone Encounter (Signed)
I spoke with pt and explained need for OV due to anticoagulant and recent MI; understanding voiced.  Appt with extender made for 05-05-13 at 8:30 a.m.

## 2013-05-03 NOTE — Telephone Encounter (Signed)
Dr. Marina Goodell,  This pt has a very recent cardiac history- STEMI in January of this year and is currently on Brilinta.  He is scheduled for a PV 05-06-13 and a colonoscopy on 05-19-13.  With his recent hx, I just wanted to see if you wanted to see him yourself or if ok to schedule with one of the extenders- which he would normally do since he on an anticoagulant.  Thank you, Baxter Hire

## 2013-05-03 NOTE — Telephone Encounter (Signed)
Extender would be great. They will review the case with me. Thanks

## 2013-05-04 ENCOUNTER — Ambulatory Visit (HOSPITAL_COMMUNITY): Payer: No Typology Code available for payment source

## 2013-05-05 ENCOUNTER — Ambulatory Visit (INDEPENDENT_AMBULATORY_CARE_PROVIDER_SITE_OTHER): Payer: No Typology Code available for payment source | Admitting: Physician Assistant

## 2013-05-05 ENCOUNTER — Encounter: Payer: Self-pay | Admitting: Physician Assistant

## 2013-05-05 VITALS — BP 104/66 | HR 80 | Ht 66.0 in | Wt 200.2 lb

## 2013-05-05 DIAGNOSIS — Z1211 Encounter for screening for malignant neoplasm of colon: Secondary | ICD-10-CM

## 2013-05-05 DIAGNOSIS — Z7901 Long term (current) use of anticoagulants: Secondary | ICD-10-CM

## 2013-05-05 NOTE — Progress Notes (Signed)
Subjective:    Patient ID: Jason Foster, male    DOB: 06-07-1960, 54 y.o.   MRN: [id number]  HPI Jason Foster is a 53 year old male referred today by his primary care physician Dr. Hyman Hopes for colon neoplasia screening. Patient has not had any prior colon evaluation. He is currently asymptomatic. He specifically denies any problems with abdominal pain ,change in his bowel habits ,melena or hematochezia. He is no complaint of heartburn indigestion or nausea. Family history is negative for colon cancer. Patient is status post STEMI in January of 2015 and underwent drug-eluting stent x2 to the LAD. He is followed by Dr. Swaziland. He is currently on Brilinta and aspirin. He does have an ischemic cardiomyopathy with ejection fraction of 35-40%. He says he has been doing well and has no active symptoms.    Review of Systems  Constitutional: Negative.   HENT: Negative.   Eyes: Negative.   Respiratory: Negative.   Cardiovascular: Negative.   Gastrointestinal: Negative.   Endocrine: Negative.   Genitourinary: Negative.   Musculoskeletal: Negative.   Skin: Negative.   Allergic/Immunologic: Negative.   Neurological: Negative.   Hematological: Negative.   Psychiatric/Behavioral: Negative.    Outpatient Prescriptions Prior to Visit  Medication Sig Dispense Refill  . aspirin EC 81 MG tablet Take 1 tablet (81 mg total) by mouth daily.  30 tablet  11  . atorvastatin (LIPITOR) 80 MG tablet Take 1 tablet (80 mg total) by mouth daily at 6 PM.  30 tablet  11  . carvedilol (COREG) 6.25 MG tablet Take 1 tablet (6.25 mg total) by mouth 2 (two) times daily with a meal.  60 tablet  11  . lisinopril (PRINIVIL,ZESTRIL) 20 MG tablet Take 1 tablet (20 mg total) by mouth daily.  90 tablet  3  . nitroGLYCERIN (NITROSTAT) 0.4 MG SL tablet Place 1 tablet (0.4 mg total) under the tongue every 5 (five) minutes as needed for chest pain.  25 tablet  12  . sildenafil (VIAGRA) 50 MG tablet Take 1 tablet (50 mg total) by  mouth daily as needed for erectile dysfunction.  30 tablet  3  . spironolactone (ALDACTONE) 25 MG tablet Take 0.5 tablets (12.5 mg total) by mouth daily.  15 tablet  5  . Ticagrelor (BRILINTA) 90 MG TABS tablet Take 1 tablet (90 mg total) by mouth 2 (two) times daily.  180 tablet  3   No facility-administered medications prior to visit.   No Known Allergies Patient Active Problem List   Diagnosis Date Noted  . Coronary atherosclerosis of native coronary artery 02/12/2013  . ST elevation myocardial infarction (STEMI) of anterior wall 02/10/2013  . Cardiomyopathy, ischemic 02/10/2013  . Antiplatelet or antithrombotic long-term use 02/10/2013  . Dyslipidemia 12/30/2012  . Essential hypertension, benign 11/29/2012   History  Substance Use Topics  . Smoking status: Former Smoker    Types: Cigarettes    Quit date: 02/18/2012  . Smokeless tobacco: Never Used  . Alcohol Use: No   family history is not on file.     Objective:   Physical Exam   well-developed male in no acute distress, pleasant blood pressure 104/66 pulse 88 height 5 foot 6 weight 200. Not further examined today- discussion only          Assessment & Plan:  #70  53 year old male referred for colon neoplasia screening. He is of average risk and asymptomatic. #2 status post STEMI 02/10/2013. Patient required drug-eluting stent x2 to the LAD and is currently on Brilinta  and aspirin #3 ischemic cardiomyopathy with EF of 35-40% #4 hypertension  Plan; Patient is not appropriate for colonoscopy at this time. Generally elective procedures are delayed at least  6 months post MI and one year before stopping anticoagulants in his situation. We discussed alternative methods for colon neoplasia screening including Cologuard stool DNA testing- however even if this was positive it would be best for him to wait until 1 year out from his MI prior to stopping anticoagulation. We will put him in for return office visit in January  February of 2016 and can consider colonoscopy at that time with Dr. Arlyce DiceKaplan.

## 2013-05-05 NOTE — Patient Instructions (Signed)
We will call you for an appointment in January 2016.  Please call us in the meantime if you have any Gastroenterology problems.

## 2013-05-05 NOTE — Progress Notes (Signed)
Reviewed and agree with management. Sabria Florido D. Shriya Aker, M.D., FACG  

## 2013-05-06 ENCOUNTER — Ambulatory Visit (HOSPITAL_COMMUNITY): Payer: No Typology Code available for payment source

## 2013-05-09 ENCOUNTER — Ambulatory Visit (HOSPITAL_COMMUNITY): Payer: No Typology Code available for payment source

## 2013-05-11 ENCOUNTER — Ambulatory Visit (HOSPITAL_COMMUNITY): Payer: No Typology Code available for payment source

## 2013-05-13 ENCOUNTER — Ambulatory Visit (HOSPITAL_COMMUNITY): Payer: No Typology Code available for payment source

## 2013-05-16 ENCOUNTER — Ambulatory Visit (HOSPITAL_COMMUNITY): Payer: No Typology Code available for payment source

## 2013-05-18 ENCOUNTER — Ambulatory Visit (HOSPITAL_COMMUNITY): Payer: No Typology Code available for payment source

## 2013-05-19 ENCOUNTER — Encounter: Payer: Self-pay | Admitting: Internal Medicine

## 2013-05-20 ENCOUNTER — Ambulatory Visit (HOSPITAL_COMMUNITY): Payer: No Typology Code available for payment source

## 2013-05-23 ENCOUNTER — Ambulatory Visit (HOSPITAL_COMMUNITY): Payer: No Typology Code available for payment source

## 2013-05-25 ENCOUNTER — Ambulatory Visit (HOSPITAL_COMMUNITY): Payer: No Typology Code available for payment source

## 2013-05-27 ENCOUNTER — Ambulatory Visit (HOSPITAL_COMMUNITY): Payer: No Typology Code available for payment source

## 2013-05-30 ENCOUNTER — Ambulatory Visit (HOSPITAL_COMMUNITY): Payer: No Typology Code available for payment source

## 2013-06-01 ENCOUNTER — Ambulatory Visit (HOSPITAL_COMMUNITY): Payer: No Typology Code available for payment source

## 2013-06-03 ENCOUNTER — Ambulatory Visit (HOSPITAL_COMMUNITY): Payer: No Typology Code available for payment source

## 2013-07-28 ENCOUNTER — Ambulatory Visit: Payer: No Typology Code available for payment source | Admitting: Internal Medicine

## 2013-08-16 ENCOUNTER — Other Ambulatory Visit: Payer: Self-pay | Admitting: Cardiology

## 2013-08-17 ENCOUNTER — Telehealth: Payer: Self-pay | Admitting: Internal Medicine

## 2013-08-17 NOTE — Telephone Encounter (Signed)
Pt needs med refill for Spironolactone 25mg  Please f/u with Dennie Bible

## 2013-09-01 ENCOUNTER — Ambulatory Visit: Payer: No Typology Code available for payment source | Attending: Internal Medicine

## 2013-09-07 ENCOUNTER — Ambulatory Visit: Payer: Self-pay | Admitting: Internal Medicine

## 2013-10-14 ENCOUNTER — Other Ambulatory Visit: Payer: Self-pay | Admitting: Cardiology

## 2013-10-14 ENCOUNTER — Other Ambulatory Visit: Payer: Self-pay | Admitting: Internal Medicine

## 2013-11-14 ENCOUNTER — Encounter: Payer: Self-pay | Admitting: Physician Assistant

## 2013-11-14 ENCOUNTER — Encounter (HOSPITAL_COMMUNITY): Payer: Self-pay | Admitting: General Practice

## 2013-11-14 ENCOUNTER — Telehealth: Payer: Self-pay | Admitting: *Deleted

## 2013-11-14 ENCOUNTER — Ambulatory Visit (INDEPENDENT_AMBULATORY_CARE_PROVIDER_SITE_OTHER): Payer: Medicaid Other | Admitting: Physician Assistant

## 2013-11-14 ENCOUNTER — Inpatient Hospital Stay (HOSPITAL_COMMUNITY)
Admission: AD | Admit: 2013-11-14 | Discharge: 2013-11-16 | DRG: 302 | Disposition: A | Discharge status: Home / Self Care | Payer: Medicaid Other | Source: Ambulatory Visit | Attending: Cardiology | Admitting: Cardiology

## 2013-11-14 ENCOUNTER — Other Ambulatory Visit: Payer: Self-pay | Admitting: Physician Assistant

## 2013-11-14 VITALS — BP 122/64 | HR 75 | Ht 66.0 in | Wt 201.0 lb

## 2013-11-14 DIAGNOSIS — I255 Ischemic cardiomyopathy: Secondary | ICD-10-CM

## 2013-11-14 DIAGNOSIS — I10 Essential (primary) hypertension: Secondary | ICD-10-CM | POA: Diagnosis present

## 2013-11-14 DIAGNOSIS — K2981 Duodenitis with bleeding: Secondary | ICD-10-CM | POA: Diagnosis present

## 2013-11-14 DIAGNOSIS — I25110 Atherosclerotic heart disease of native coronary artery with unstable angina pectoris: Principal | ICD-10-CM | POA: Diagnosis present

## 2013-11-14 DIAGNOSIS — Z7982 Long term (current) use of aspirin: Secondary | ICD-10-CM

## 2013-11-14 DIAGNOSIS — I200 Unstable angina: Secondary | ICD-10-CM

## 2013-11-14 DIAGNOSIS — I249 Acute ischemic heart disease, unspecified: Secondary | ICD-10-CM | POA: Diagnosis present

## 2013-11-14 DIAGNOSIS — E785 Hyperlipidemia, unspecified: Secondary | ICD-10-CM | POA: Diagnosis present

## 2013-11-14 DIAGNOSIS — I252 Old myocardial infarction: Secondary | ICD-10-CM

## 2013-11-14 DIAGNOSIS — D649 Anemia, unspecified: Secondary | ICD-10-CM | POA: Insufficient documentation

## 2013-11-14 DIAGNOSIS — I2582 Chronic total occlusion of coronary artery: Secondary | ICD-10-CM | POA: Diagnosis present

## 2013-11-14 DIAGNOSIS — K269 Duodenal ulcer, unspecified as acute or chronic, without hemorrhage or perforation: Secondary | ICD-10-CM

## 2013-11-14 DIAGNOSIS — I25118 Atherosclerotic heart disease of native coronary artery with other forms of angina pectoris: Secondary | ICD-10-CM

## 2013-11-14 DIAGNOSIS — K2960 Other gastritis without bleeding: Secondary | ICD-10-CM | POA: Diagnosis present

## 2013-11-14 DIAGNOSIS — Z79899 Other long term (current) drug therapy: Secondary | ICD-10-CM

## 2013-11-14 DIAGNOSIS — K921 Melena: Secondary | ICD-10-CM

## 2013-11-14 DIAGNOSIS — Z955 Presence of coronary angioplasty implant and graft: Secondary | ICD-10-CM

## 2013-11-14 DIAGNOSIS — Z87891 Personal history of nicotine dependence: Secondary | ICD-10-CM

## 2013-11-14 DIAGNOSIS — Z7902 Long term (current) use of antithrombotics/antiplatelets: Secondary | ICD-10-CM

## 2013-11-14 DIAGNOSIS — D62 Acute posthemorrhagic anemia: Secondary | ICD-10-CM

## 2013-11-14 LAB — COMPREHENSIVE METABOLIC PANEL
ALK PHOS: 94 U/L (ref 39–117)
ALT: 18 U/L (ref 0–53)
AST: 19 U/L (ref 0–37)
Albumin: 3.4 g/dL — ABNORMAL LOW (ref 3.5–5.2)
Anion gap: 13 (ref 5–15)
BILIRUBIN TOTAL: 0.3 mg/dL (ref 0.3–1.2)
BUN: 14 mg/dL (ref 6–23)
CHLORIDE: 103 meq/L (ref 96–112)
CO2: 21 mEq/L (ref 19–32)
CREATININE: 0.92 mg/dL (ref 0.50–1.35)
Calcium: 8.6 mg/dL (ref 8.4–10.5)
GFR calc Af Amer: >90 mL/min (ref >90)
GFR calc non Af Amer: >90 mL/min (ref >90)
Glucose, Bld: 96 mg/dL (ref 70–99)
POTASSIUM: 4.4 meq/L (ref 3.7–5.3)
Sodium: 137 mEq/L (ref 137–147)
Total Protein: 7 g/dL (ref 6.0–8.3)

## 2013-11-14 LAB — CBC WITH DIFFERENTIAL/PLATELET
BASOS ABS: 0 10*3/uL (ref 0.0–0.1)
BASOS PCT: 0 % (ref 0–1)
EOS ABS: 0.1 10*3/uL (ref 0.0–0.7)
Eosinophils Relative: 2 % (ref 0–5)
HCT: 25.2 % — ABNORMAL LOW (ref 39.0–52.0)
HEMOGLOBIN: 8 g/dL — AB (ref 13.0–17.0)
Lymphocytes Relative: 33 % (ref 12–46)
Lymphs Abs: 3 10*3/uL (ref 0.7–4.0)
MCH: 25.8 pg — AB (ref 26.0–34.0)
MCHC: 31.7 g/dL (ref 30.0–36.0)
MCV: 81.3 fL (ref 78.0–100.0)
Monocytes Absolute: 0.6 10*3/uL (ref 0.1–1.0)
Monocytes Relative: 7 % (ref 3–12)
NEUTROS PCT: 58 % (ref 43–77)
Neutro Abs: 5.2 10*3/uL (ref 1.7–7.7)
PLATELETS: 264 10*3/uL (ref 150–400)
RBC: 3.1 MIL/uL — ABNORMAL LOW (ref 4.22–5.81)
RDW: 15.6 % — ABNORMAL HIGH (ref 11.5–15.5)
WBC: 9 10*3/uL (ref 4.0–10.5)

## 2013-11-14 LAB — TROPONIN I

## 2013-11-14 LAB — APTT: APTT: 31 s (ref 24–37)

## 2013-11-14 LAB — CBC
HCT: 23.2 % — ABNORMAL LOW (ref 39.0–52.0)
HEMOGLOBIN: 7.6 g/dL — AB (ref 13.0–17.0)
MCH: 26.6 pg (ref 26.0–34.0)
MCHC: 32.8 g/dL (ref 30.0–36.0)
MCV: 81.1 fL (ref 78.0–100.0)
PLATELETS: 226 10*3/uL (ref 150–400)
RBC: 2.86 MIL/uL — AB (ref 4.22–5.81)
RDW: 15.5 % (ref 11.5–15.5)
WBC: 8.4 10*3/uL (ref 4.0–10.5)

## 2013-11-14 LAB — MAGNESIUM: MAGNESIUM: 2 mg/dL (ref 1.5–2.5)

## 2013-11-14 LAB — TSH: TSH: 0.756 u[IU]/mL (ref 0.350–4.500)

## 2013-11-14 LAB — PROTIME-INR
INR: 1.26 (ref 0.00–1.49)
Prothrombin Time: 16 seconds — ABNORMAL HIGH (ref 11.6–15.2)

## 2013-11-14 MED ORDER — ATORVASTATIN CALCIUM 80 MG PO TABS
80.0000 mg | ORAL_TABLET | Freq: Every day | ORAL | Status: DC
  Administered 2013-11-15: 80 mg via ORAL
  Filled 2013-11-14: qty 1

## 2013-11-14 MED ORDER — ACETAMINOPHEN 325 MG PO TABS
650.0000 mg | ORAL_TABLET | ORAL | Status: DC | PRN
  Administered 2013-11-15: 650 mg via ORAL
  Filled 2013-11-14: qty 2

## 2013-11-14 MED ORDER — SPIRONOLACTONE 25 MG PO TABS
12.5000 mg | ORAL_TABLET | Freq: Every day | ORAL | Status: DC
  Administered 2013-11-15 – 2013-11-16 (×2): 12.5 mg via ORAL
  Filled 2013-11-14 (×3): qty 1

## 2013-11-14 MED ORDER — SODIUM CHLORIDE 0.9 % IJ SOLN: 3.0000 mL | INTRAMUSCULAR | Status: DC | PRN

## 2013-11-14 MED ORDER — ONDANSETRON HCL 4 MG/2ML IJ SOLN: 4.0000 mg | Freq: Four times a day (QID) | INTRAMUSCULAR | Status: DC | PRN

## 2013-11-14 MED ORDER — ASPIRIN 81 MG PO CHEW: 81.0000 mg | CHEWABLE_TABLET | ORAL | Status: DC

## 2013-11-14 MED ORDER — SODIUM CHLORIDE 0.9 % IV SOLN: 250.0000 mL | INTRAVENOUS | Status: DC | PRN

## 2013-11-14 MED ORDER — HEPARIN (PORCINE) IN NACL 100-0.45 UNIT/ML-% IJ SOLN
1150.0000 [IU]/h | INTRAMUSCULAR | Status: DC
  Administered 2013-11-14: 1150 [IU]/h via INTRAVENOUS
  Filled 2013-11-14: qty 250

## 2013-11-14 MED ORDER — ACETAMINOPHEN 325 MG PO TABS
650.0000 mg | ORAL_TABLET | ORAL | Status: DC | PRN
  Administered 2013-11-14: 650 mg via ORAL
  Filled 2013-11-14: qty 2

## 2013-11-14 MED ORDER — CARVEDILOL 6.25 MG PO TABS
6.2500 mg | ORAL_TABLET | Freq: Two times a day (BID) | ORAL | Status: DC
  Administered 2013-11-15 – 2013-11-16 (×3): 6.25 mg via ORAL
  Filled 2013-11-14 (×3): qty 1

## 2013-11-14 MED ORDER — ASPIRIN 300 MG RE SUPP: 300.0000 mg | RECTAL | Status: AC

## 2013-11-14 MED ORDER — HEPARIN BOLUS VIA INFUSION
4000.0000 [IU] | Freq: Once | INTRAVENOUS | Status: AC
  Administered 2013-11-14: 4000 [IU] via INTRAVENOUS
  Filled 2013-11-14: qty 4000

## 2013-11-14 MED ORDER — LISINOPRIL 20 MG PO TABS
20.0000 mg | ORAL_TABLET | Freq: Every day | ORAL | Status: DC
  Administered 2013-11-15 – 2013-11-16 (×2): 20 mg via ORAL
  Filled 2013-11-14 (×2): qty 1

## 2013-11-14 MED ORDER — SODIUM CHLORIDE 0.9 % IV SOLN
INTRAVENOUS | Status: DC
  Administered 2013-11-14 – 2013-11-15 (×3): via INTRAVENOUS
  Administered 2013-11-16: 500 mL via INTRAVENOUS

## 2013-11-14 MED ORDER — ASPIRIN EC 81 MG PO TBEC
81.0000 mg | DELAYED_RELEASE_TABLET | Freq: Every day | ORAL | Status: DC
  Administered 2013-11-15 – 2013-11-16 (×2): 81 mg via ORAL
  Filled 2013-11-14 (×3): qty 1

## 2013-11-14 MED ORDER — SODIUM CHLORIDE 0.9 % IJ SOLN
3.0000 mL | Freq: Two times a day (BID) | INTRAMUSCULAR | Status: DC
  Administered 2013-11-14 – 2013-11-15 (×2): 3 mL via INTRAVENOUS

## 2013-11-14 MED ORDER — ZOLPIDEM TARTRATE 5 MG PO TABS: 5.0000 mg | ORAL_TABLET | Freq: Every evening | ORAL | Status: DC | PRN

## 2013-11-14 MED ORDER — ALPRAZOLAM 0.25 MG PO TABS: 0.2500 mg | ORAL_TABLET | Freq: Two times a day (BID) | ORAL | Status: DC | PRN

## 2013-11-14 MED ORDER — ASPIRIN 81 MG PO CHEW
324.0000 mg | CHEWABLE_TABLET | ORAL | Status: AC
  Administered 2013-11-14: 324 mg via ORAL
  Filled 2013-11-14: qty 4

## 2013-11-14 NOTE — Assessment & Plan Note (Signed)
EF normalized on followup 2-D echo. EF 55-60%.

## 2013-11-14 NOTE — Telephone Encounter (Signed)
Jason Foster walked in to make an appointment.  Told receptionist that he has been having CP over the week end but not currently experiencing CP.  Was called to evaluate.  Pt states that when he walked short distances over the week end had some CP that lasted for several minutes.  He did not take a NTG. No CP at present but states he just feels weak and tired.  Denies SOB. Spoke w/Michele Lenze,PA who states she will see pt.  Advised pt that he will be seen today.  He is agreeable and will wait.

## 2013-11-14 NOTE — Assessment & Plan Note (Signed)
Blood pressure is stable 

## 2013-11-14 NOTE — Telephone Encounter (Signed)
Pt not showing as being currently admitted. I contacted pt and he said he stopped to get his medicines and is now currently on his way to the hospital.

## 2013-11-14 NOTE — Progress Notes (Signed)
Pt admitted from office today for cardiac cath. Pre-cath labs with Hgb of 8.0. Pt describes black tarry stools for 3 days. Will cancel cardiac cath. Heme check stool. CBC in am. Heart healthy diet. D/C heparin IV. Follow troponin. May need GI consult.   MCALHANY,CHRISTOPHER 11/14/2013 4:37 PM

## 2013-11-14 NOTE — Progress Notes (Addendum)
Jason Foster is a 53 year old male patient who has a history of an anterior STEMI on 02/10/13 treated with drug-eluting stent x2 the LAD. He also has hypertension hyperlipidemia. He quit smoking proximally one half years ago. At cath he also had a totally occluded RCA that appeared to be chronic and was collateralized. EF 35-40%. Followup 2-D echo on 3/23/15showed normalization of LV function EF 55-60% with grade 1 diastolic dysfunction.Patient also has history of hypertension and hyperlipidemia.  The patient walked in our office today to schedule appointment because he had chest pain. He is on Brilinta and is only taking it once a day for the past 9 months. He also says his carvedilol was switched to once a day drug use he can't remember to take pills twice a day. He complains of exertional chest pain everyday for the past 4 days. Yesterday he was carrying something that weighed about 20 pounds and he developed chest tightness radiating down both arms. He had no associated dyspnea, diaphoresis, dizziness or presyncope. Patient says the pain only lasted 1 minute and went away when he stopped. He did not use nitroglycerin. No Known Allergies   Current Outpatient Prescriptions  Medication Sig Dispense Refill  . aspirin EC 81 MG tablet Take 1 tablet (81 mg total) by mouth daily. 30 tablet 11  . atorvastatin (LIPITOR) 80 MG tablet Take 1 tablet (80 mg total) by mouth daily at 6 PM. 30 tablet 11  . carvedilol (COREG) 6.25 MG tablet Take 1 tablet (6.25 mg total) by mouth 2 (two) times daily with a meal. 60 tablet 11  . lisinopril (PRINIVIL,ZESTRIL) 20 MG tablet Take 1 tablet (20 mg total) by mouth daily. 90 tablet 3  . nitroGLYCERIN (NITROSTAT) 0.4 MG SL tablet Place 1 tablet (0.4 mg total) under the tongue every 5 (five) minutes as needed for chest pain. 25 tablet 12  . sildenafil (VIAGRA) 50 MG tablet Take 1 tablet (50 mg total) by mouth daily as needed for erectile dysfunction. 30 tablet 3  .  spironolactone (ALDACTONE) 25 MG tablet TAKE 1/2 TABLET BY MOUTH DAILY. 15 tablet 0  . Ticagrelor (BRILINTA) 90 MG TABS tablet Take 1 tablet (90 mg total) by mouth 2 (two) times daily. 180 tablet 3   No current facility-administered medications for this visit.    Past Medical History  Diagnosis Date  . Hypertension   . CAD (coronary artery disease)     a. Ant STEMI (01/2013):  LHC - dLM 20, pLAD 100 (DES x 2), CFX < 20, mRCA 100% (L-R collats)  . Ischemic cardiomyopathy     a. echo (01/2013):  mild LVH, EF 35-40%, AS and apical AK, PASP 34  . HLD (hyperlipidemia)   . ST elevation (STEMI) myocardial infarction involving left anterior descending coronary artery 01/2013    No past surgical history on file.  No family history on file.  History   Social History  . Marital Status: Married    Spouse Name: N/A    Number of Children: 3  . Years of Education: N/A   Occupational History  . Community education officer    Social History Main Topics  . Smoking status: Former Smoker    Types: Cigarettes    Quit date: 02/18/2012  . Smokeless tobacco: Never Used  . Alcohol Use: No  . Drug Use: No  . Sexual Activity: Yes   Other Topics Concern  . Not on file   Social History Narrative  . No narrative on file  ROS: See HPI Eyes: Negative Ears:Negative for hearing loss, tinnitus Cardiovascular: positive for chest pain, negative forpalpitations,irregular heartbeat, dyspnea, dyspnea on exertion, near-syncope, orthopnea, paroxysmal nocturnal dyspnea and syncope,edema, claudication, cyanosis,.  Respiratory:   Negative for cough, hemoptysis, shortness of breath, sleep disturbances due to breathing, sputum production and wheezing.   Endocrine: Negative for cold intolerance and heat intolerance.  Hematologic/Lymphatic: Negative for adenopathy and bleeding problem. Does not bruise/bleed easily.  Musculoskeletal: Negative.   Gastrointestinal: Negative for nausea, vomiting, reflux, abdominal pain,  diarrhea, constipation.   Genitourinary: Negative for bladder incontinence, dysuria, flank pain, frequency, hematuria, hesitancy, nocturia and urgency.  Neurological: Negative.  Allergic/Immunologic: Negative for environmental allergies.   BP 122/64 mmHg  Pulse 75  Ht 5\' 6"  (1.676 m)  Wt 201 lb (91.173 kg)  BMI 32.46 kg/m2  PHYSICAL EXAM: Well-nournished, in no acute distress. Neck: No JVD, HJR, Bruit, or thyroid enlargement  Lungs: No tachypnea, clear without wheezing, rales, or rhonchi  Cardiovascular: RRR, PMI not displaced, Normal S1 and S2, no murmurs, gallops, bruit, thrill, or heave.  Abdomen: BS normal. Soft without organomegaly, masses, lesions or tenderness.  Extremities: without cyanosis, clubbing or edema. Good distal pulses bilateral  SKin: Warm, no lesions or rashes   Musculoskeletal: No deformities  Neuro: no focal signs   Wt Readings from Last 3 Encounters:  05/05/13 200 lb 4 oz (90.833 kg)  03/31/13 195 lb (88.451 kg)  03/17/13 194 lb 1.9 oz (88.052 kg)    Lab Results  Component Value Date   WBC 13.2* 02/12/2013   HGB 14.8 02/12/2013   HCT 43.9 02/12/2013   PLT 221 02/12/2013   GLUCOSE 95 03/17/2013   CHOL 113 03/17/2013   TRIG 283.0* 03/17/2013   HDL 22.40* 03/17/2013   LDLCALC 34 03/17/2013   ALT 25 03/17/2013   AST 20 03/17/2013   NA 132* 03/17/2013   K 4.0 03/17/2013   CL 99 03/17/2013   CREATININE 1.0 03/17/2013   BUN 17 03/17/2013   CO2 26 03/17/2013   TSH 0.652 11/29/2012   INR 1.15 02/10/2013   HGBA1C 6.1* 02/10/2013    WUJ:WJXBJYEKG:Normal sinus rhythm at 75 beats per minute old old anterior septal infarct, no acute change   2-D echo 04/04/13: Study Conclusions  - Left ventricle: There is mid and basal anterior, mid   anteroseptal and distal septal wall hypokinesis. Overal   LVEF is preserved. The cavity size was normal. Systolic   function was normal. The estimated ejection fraction was   in the range of 55% to 60%. Doppler  parameters are   consistent with abnormal left ventricular relaxation   (grade 1 diastolic dysfunction). There was no evidence of   elevated ventricular filling pressure by Doppler   parameters. - Aortic valve: Trileaflet; mildly thickened, mildly   calcified leaflets. Transvalvular velocity was within the   normal range. There was no stenosis. No regurgitation. - Aortic root: The aortic root was normal in size. - Mitral valve: No regurgitation. - Right ventricle: Systolic function was normal. - Pulmonic valve: Trivial regurgitation. - Pulmonary arteries: Systolic pressure was within the   normal range. - Pericardium, extracardiac: There was no pericardial   effusion. Impressions:  - Compared to the prior study LVEF has improved and is now   normal.  I have seen and examined this pt upon his arrival to the hospital.   [facility] LLCMCALHANY,CHRISTOPHER 11/21/2013 4:52 PM

## 2013-11-14 NOTE — Assessment & Plan Note (Signed)
Check labs in the hospital

## 2013-11-14 NOTE — H&P (Unsigned)
ZOX:WRUE is a 53 year old male patient of Dr. [place]iland who has a history of an anterior STEMI on 02/10/13 treated with drug-eluting stent x2 the LAD. He also has hypertension hyperlipidemia. He quit smoking proximally one half years ago. At cath he also had a totally occluded RCA that appeared to be chronic and was collateralized. EF 35-40%. Followup 2-D echo on 3/23/15showed normalization of LV function EF 55-60% with grade 1 diastolic dysfunction.Patient also has history of hypertension and hyperlipidemia.  The patient walked in our office today to schedule appointment because he had chest pain. He is on Brilinta and is only taking it once a day for the past 9 months. He also says his carvedilol was switched to once a day drug use he can't remember to take pills twice a day. He complains of exertional chest pain everyday for the past 4 days. Yesterday he was carrying something that weighed about 20 pounds and he developed chest tightness radiating down both arms. He had no associated dyspnea, diaphoresis, dizziness or presyncope. Patient says the pain only lasted 1 minute and went away when he stopped. He did not use nitroglycerin. No Known Allergies   Current Outpatient Prescriptions  Medication Sig Dispense Refill  . aspirin EC 81 MG tablet Take 1 tablet (81 mg total) by mouth daily. 30 tablet 11  . atorvastatin (LIPITOR) 80 MG tablet Take 1 tablet (80 mg total) by mouth daily at 6 PM. 30 tablet 11  . carvedilol (COREG) 6.25 MG tablet Take 1 tablet (6.25 mg total) by mouth 2 (two) times daily with a meal. 60 tablet 11  . lisinopril (PRINIVIL,ZESTRIL) 20 MG tablet Take 1 tablet (20 mg total) by mouth daily. 90 tablet 3  . nitroGLYCERIN (NITROSTAT) 0.4 MG SL tablet Place 1 tablet (0.4 mg total) under the tongue every 5 (five) minutes as needed for chest pain. 25 tablet 12  . sildenafil (VIAGRA) 50 MG tablet Take 1 tablet (50 mg total) by mouth daily as needed for  erectile dysfunction. 30 tablet 3  . spironolactone (ALDACTONE) 25 MG tablet TAKE 1/2 TABLET BY MOUTH DAILY. 15 tablet 0  . Ticagrelor (BRILINTA) 90 MG TABS tablet Take 1 tablet (90 mg total) by mouth 2 (two) times daily. 180 tablet 3   No current facility-administered medications for this visit.    Past Medical History  Diagnosis Date  . Hypertension   . CAD (coronary artery disease)     a. Ant STEMI (01/2013): LHC - dLM 20, pLAD 100 (DES x 2), CFX < 20, mRCA 100% (L-R collats)  . Ischemic cardiomyopathy     a. echo (01/2013): mild LVH, EF 35-40%, AS and apical AK, PASP 34  . HLD (hyperlipidemia)   . ST elevation (STEMI) myocardial infarction involving left anterior descending coronary artery 01/2013    No past surgical history on file.  No family history on file.  History   Social History  . Marital Status: Married    Spouse Name: N/A    Number of Children: 3  . Years of Education: N/A   Occupational History  . Community education officer    Social History Main Topics  . Smoking status: Former Smoker    Types: Cigarettes    Quit date: 02/18/2012  . Smokeless tobacco: Never Used  . Alcohol Use: No  . Drug Use: No  . Sexual Activity: Yes   Other Topics Concern  . Not on file   Social History Narrative  . No narrative on file  ROS: See HPI Eyes: Negative Ears:Negative for hearing loss, tinnitus Cardiovascular: positive for chest pain, negative forpalpitations,irregular heartbeat, dyspnea, dyspnea on exertion, near-syncope, orthopnea, paroxysmal nocturnal dyspnea and syncope,edema, claudication, cyanosis,.  Respiratory: Negative for cough, hemoptysis, shortness of breath, sleep disturbances due to breathing, sputum production and wheezing.  Endocrine: Negative for cold intolerance and heat intolerance.  Hematologic/Lymphatic: Negative for adenopathy and bleeding problem.  Does not bruise/bleed easily.  Musculoskeletal: Negative.  Gastrointestinal: Negative for nausea, vomiting, reflux, abdominal pain, diarrhea, constipation.  Genitourinary: Negative for bladder incontinence, dysuria, flank pain, frequency, hematuria, hesitancy, nocturia and urgency.  Neurological: Negative.  Allergic/Immunologic: Negative for environmental allergies.   BP 122/64 mmHg  Pulse 75  Ht 5\' 6"  (1.676 m)  Wt 201 lb (91.173 kg)  BMI 32.46 kg/m2  PHYSICAL EXAM: Well-nournished, in no acute distress. Neck: No JVD, HJR, Bruit, or thyroid enlargement  Lungs: No tachypnea, clear without wheezing, rales, or rhonchi  Cardiovascular: RRR, PMI not displaced, Normal S1 and S2, no murmurs, gallops, bruit, thrill, or heave.  Abdomen: BS normal. Soft without organomegaly, masses, lesions or tenderness.  Extremities: without cyanosis, clubbing or edema. Good distal pulses bilateral  SKin: Warm, no lesions or rashes   Musculoskeletal: No deformities  Neuro: no focal signs   Wt Readings from Last 3 Encounters:  05/05/13 200 lb 4 oz (90.833 kg)  03/31/13 195 lb (88.451 kg)  03/17/13 194 lb 1.9 oz (88.052 kg)     Recent Labs    Lab Results  Component Value Date   WBC 13.2* 02/12/2013   HGB 14.8 02/12/2013   HCT 43.9 02/12/2013   PLT 221 02/12/2013   GLUCOSE 95 03/17/2013   CHOL 113 03/17/2013   TRIG 283.0* 03/17/2013   HDL 22.40* 03/17/2013   LDLCALC 34 03/17/2013   ALT 25 03/17/2013   AST 20 03/17/2013   NA 132* 03/17/2013   K 4.0 03/17/2013   CL 99 03/17/2013   CREATININE 1.0 03/17/2013   BUN 17 03/17/2013   CO2 26 03/17/2013   TSH 0.652 11/29/2012   INR 1.15 02/10/2013   HGBA1C 6.1* 02/10/2013      WGN:FAOZHYEKG:Normal sinus rhythm at 75 beats per minute old old anterior septal infarct, no acute change   2-D echo 04/04/13: Study Conclusions  - Left ventricle: There is  mid and basal anterior, mid anteroseptal and distal septal wall hypokinesis. Overal LVEF is preserved. The cavity size was normal. Systolic function was normal. The estimated ejection fraction was in the range of 55% to 60%. Doppler parameters are consistent with abnormal left ventricular relaxation (grade 1 diastolic dysfunction). There was no evidence of elevated ventricular filling pressure by Doppler parameters. - Aortic valve: Trileaflet; mildly thickened, mildly calcified leaflets. Transvalvular velocity was within the normal range. There was no stenosis. No regurgitation. - Aortic root: The aortic root was normal in size. - Mitral valve: No regurgitation. - Right ventricle: Systolic function was normal. - Pulmonic valve: Trivial regurgitation. - Pulmonary arteries: Systolic pressure was within the normal range. - Pericardium, extracardiac: There was no pericardial effusion. Impressions:  - Compared to the prior study LVEF has improved and is now normal.            Revision History       Date/Time User Action    > 11/14/2013 11:32 AM Dyann KiefMichele M Lenze, PA-C Addend     11/14/2013 11:30 AM Dyann KiefMichele M Lenze, PA-C Sign              Unstable angina -  Dyann Kief, PA-C at 11/14/2013 11:01 AM     Status: Written Related Problem: Unstable angina   Expand All Collapse All   Patient complains of chest pain everyday for the past 4 days. He is not taking the Brilinta as prescribed. For the past 9 months he's only been taking it once a day and often forgets his medications. He can't remember to take medications twice a day. I discussed this patient in detail with Dr. Delton See who agrees he needs admitted for cardiac catheterization. We'll start IV heparin. I discussed the risks and benefitsof cardiac catheterization with the patient who is agreeable.He is currently not having chest pain.            Dyslipidemia - Dyann Kief,  PA-C at 11/14/2013 11:02 AM     Status: Written Related Problem: Dyslipidemia   Expand All Collapse All   Check labs in the hospital            Essential hypertension, benign - Dyann Kief, PA-C at 11/14/2013 11:02 AM     Status: Written Related Problem: Essential hypertension, benign   Expand All Collapse All   Blood pressure is stable            Cardiomyopathy, ischemic - Dyann Kief, PA-C at 11/14/2013 11:02 AM     Status: Written Related Problem: Cardiomyopathy, ischemic   Expand All Collapse All   EF normalized on followup 2-D echo. EF 55-60%.

## 2013-11-14 NOTE — Progress Notes (Signed)
ANTICOAGULATION CONSULT NOTE - Initial Consult  Pharmacy Consult for Heparin Indication: CP, r/o ACS  No Known Allergies  Patient Measurements:    Ht:  66 in      Wt: 91.2 kg Heparin Dosing Weight: 83 kg  Vital Signs: BP: 122/64 mmHg (11/02 1029) Pulse Rate: 75 (11/02 1029)  Labs: No results for input(s): HGB, HCT, PLT, APTT, LABPROT, INR, HEPARINUNFRC, CREATININE, CKTOTAL, CKMB, TROPONINI in the last 72 hours.  Estimated Creatinine Clearance: 88.3 mL/min (by C-G formula based on Cr of 1).   Medical History: Past Medical History  Diagnosis Date  . Hypertension   . CAD (coronary artery disease)     a. Ant STEMI (01/2013):  LHC - dLM 20, pLAD 100 (DES x 2), CFX < 20, mRCA 100% (L-R collats)  . Ischemic cardiomyopathy     a. echo (01/2013):  mild LVH, EF 35-40%, AS and apical AK, PASP 34  . HLD (hyperlipidemia)   . ST elevation (STEMI) myocardial infarction involving left anterior descending coronary artery 01/2013    Medications:  Prescriptions prior to admission  Medication Sig Dispense Refill Last Dose  . aspirin EC 81 MG tablet Take 1 tablet (81 mg total) by mouth daily. 30 tablet 11 Taking  . atorvastatin (LIPITOR) 80 MG tablet Take 1 tablet (80 mg total) by mouth daily at 6 PM. 30 tablet 11 Taking  . carvedilol (COREG) 6.25 MG tablet Take 1 tablet (6.25 mg total) by mouth 2 (two) times daily with a meal. 60 tablet 11 Taking  . lisinopril (PRINIVIL,ZESTRIL) 20 MG tablet Take 1 tablet (20 mg total) by mouth daily. 90 tablet 3 Taking  . nitroGLYCERIN (NITROSTAT) 0.4 MG SL tablet Place 1 tablet (0.4 mg total) under the tongue every 5 (five) minutes as needed for chest pain. 25 tablet 12 Taking  . sildenafil (VIAGRA) 50 MG tablet Take 1 tablet (50 mg total) by mouth daily as needed for erectile dysfunction. 30 tablet 3 Taking  . spironolactone (ALDACTONE) 25 MG tablet TAKE 1/2 TABLET BY MOUTH DAILY. 15 tablet 0 Taking  . Ticagrelor (BRILINTA) 90 MG TABS tablet Take 1 tablet (90  mg total) by mouth 2 (two) times daily. 180 tablet 3 Taking    Assessment: 53 y.o. male presents to o/p office today with CP. Noted pt with STEMI Jan 2015 and DES x2. Sent to the hospital from the outpt office to r/o ACS.  Goal of Therapy:  Heparin level 0.3-0.7 units/ml Monitor platelets by anticoagulation protocol: Yes   Plan:  1. Heparin IV bolus 4000 units 2. Heparin IV gtt at 1150 units/hr 3. Will f/u 6 hour heparin level 4. Daily heparin level and CBC 5. F/u baseline labs  Christoper Fabian, PharmD, BCPS Clinical pharmacist, pager [phone] 11/14/2013,2:16 PM

## 2013-11-14 NOTE — Patient Instructions (Signed)
You are being admitted to [facility].  Go through Entrance A and then to the admitting department.

## 2013-11-14 NOTE — Assessment & Plan Note (Signed)
Patient complains of chest pain everyday for the past 4 days. He is not taking the Brilinta as prescribed. For the past 9 months he's only been taking it once a day and often forgets his medications. He can't remember to take medications twice a day. I discussed this patient in detail with Dr. Delton See who agrees he needs admitted for cardiac catheterization. We'll start IV heparin. I discussed the risks and benefitsof cardiac catheterization with the patient who is agreeable.He is currently not having chest pain.

## 2013-11-15 ENCOUNTER — Other Ambulatory Visit: Payer: Self-pay

## 2013-11-15 ENCOUNTER — Encounter (HOSPITAL_COMMUNITY)
Admission: AD | Disposition: A | Discharge status: Home / Self Care | Payer: Self-pay | Source: Ambulatory Visit | Attending: Cardiology

## 2013-11-15 DIAGNOSIS — D62 Acute posthemorrhagic anemia: Secondary | ICD-10-CM | POA: Diagnosis present

## 2013-11-15 DIAGNOSIS — Z7982 Long term (current) use of aspirin: Secondary | ICD-10-CM | POA: Diagnosis not present

## 2013-11-15 DIAGNOSIS — R079 Chest pain, unspecified: Secondary | ICD-10-CM | POA: Diagnosis present

## 2013-11-15 DIAGNOSIS — Z7902 Long term (current) use of antithrombotics/antiplatelets: Secondary | ICD-10-CM | POA: Diagnosis not present

## 2013-11-15 DIAGNOSIS — K2960 Other gastritis without bleeding: Secondary | ICD-10-CM | POA: Diagnosis present

## 2013-11-15 DIAGNOSIS — I252 Old myocardial infarction: Secondary | ICD-10-CM | POA: Diagnosis not present

## 2013-11-15 DIAGNOSIS — I25118 Atherosclerotic heart disease of native coronary artery with other forms of angina pectoris: Secondary | ICD-10-CM | POA: Insufficient documentation

## 2013-11-15 DIAGNOSIS — K2981 Duodenitis with bleeding: Secondary | ICD-10-CM | POA: Diagnosis present

## 2013-11-15 DIAGNOSIS — I10 Essential (primary) hypertension: Secondary | ICD-10-CM | POA: Diagnosis present

## 2013-11-15 DIAGNOSIS — K921 Melena: Secondary | ICD-10-CM | POA: Insufficient documentation

## 2013-11-15 DIAGNOSIS — Z87891 Personal history of nicotine dependence: Secondary | ICD-10-CM | POA: Diagnosis not present

## 2013-11-15 DIAGNOSIS — I200 Unstable angina: Secondary | ICD-10-CM

## 2013-11-15 DIAGNOSIS — E785 Hyperlipidemia, unspecified: Secondary | ICD-10-CM | POA: Diagnosis present

## 2013-11-15 DIAGNOSIS — K269 Duodenal ulcer, unspecified as acute or chronic, without hemorrhage or perforation: Secondary | ICD-10-CM | POA: Diagnosis present

## 2013-11-15 DIAGNOSIS — I255 Ischemic cardiomyopathy: Secondary | ICD-10-CM

## 2013-11-15 DIAGNOSIS — Z955 Presence of coronary angioplasty implant and graft: Secondary | ICD-10-CM | POA: Diagnosis not present

## 2013-11-15 DIAGNOSIS — D649 Anemia, unspecified: Secondary | ICD-10-CM

## 2013-11-15 DIAGNOSIS — I2582 Chronic total occlusion of coronary artery: Secondary | ICD-10-CM | POA: Diagnosis present

## 2013-11-15 DIAGNOSIS — I25110 Atherosclerotic heart disease of native coronary artery with unstable angina pectoris: Secondary | ICD-10-CM | POA: Diagnosis present

## 2013-11-15 DIAGNOSIS — Z79899 Other long term (current) drug therapy: Secondary | ICD-10-CM | POA: Diagnosis not present

## 2013-11-15 LAB — CBC
HCT: 24.4 % — ABNORMAL LOW (ref 39.0–52.0)
Hemoglobin: 7.7 g/dL — ABNORMAL LOW (ref 13.0–17.0)
MCH: 25.9 pg — ABNORMAL LOW (ref 26.0–34.0)
MCHC: 31.6 g/dL (ref 30.0–36.0)
MCV: 82.2 fL (ref 78.0–100.0)
Platelets: 243 10*3/uL (ref 150–400)
RBC: 2.97 MIL/uL — ABNORMAL LOW (ref 4.22–5.81)
RDW: 15.8 % — ABNORMAL HIGH (ref 11.5–15.5)
WBC: 8.9 10*3/uL (ref 4.0–10.5)

## 2013-11-15 LAB — BASIC METABOLIC PANEL
Anion gap: 11 (ref 5–15)
BUN: 17 mg/dL (ref 6–23)
CALCIUM: 8.3 mg/dL — AB (ref 8.4–10.5)
CO2: 22 mEq/L (ref 19–32)
Chloride: 106 mEq/L (ref 96–112)
Creatinine, Ser: 0.96 mg/dL (ref 0.50–1.35)
Glucose, Bld: 98 mg/dL (ref 70–99)
POTASSIUM: 4.5 meq/L (ref 3.7–5.3)
SODIUM: 139 meq/L (ref 137–147)

## 2013-11-15 LAB — OCCULT BLOOD X 1 CARD TO LAB, STOOL: Fecal Occult Bld: NEGATIVE

## 2013-11-15 LAB — LIPID PANEL
CHOLESTEROL: 97 mg/dL (ref 0–200)
HDL: 22 mg/dL — ABNORMAL LOW (ref >39)
LDL Cholesterol: 38 mg/dL (ref 0–99)
Total CHOL/HDL Ratio: 4.4 RATIO
Triglycerides: 185 mg/dL — ABNORMAL HIGH (ref <150)
VLDL: 37 mg/dL (ref 0–40)

## 2013-11-15 LAB — TROPONIN I

## 2013-11-15 SURGERY — LEFT HEART CATHETERIZATION WITH CORONARY ANGIOGRAM
Anesthesia: LOCAL

## 2013-11-15 MED ORDER — TICAGRELOR 90 MG PO TABS
90.0000 mg | ORAL_TABLET | Freq: Two times a day (BID) | ORAL | Status: DC
  Administered 2013-11-16: 90 mg via ORAL
  Filled 2013-11-15: qty 1

## 2013-11-15 MED ORDER — OXYCODONE HCL 5 MG PO TABS
5.0000 mg | ORAL_TABLET | Freq: Once | ORAL | Status: AC
  Administered 2013-11-15: 5 mg via ORAL
  Filled 2013-11-15: qty 1

## 2013-11-15 MED ORDER — PANTOPRAZOLE SODIUM 40 MG PO TBEC
40.0000 mg | DELAYED_RELEASE_TABLET | Freq: Two times a day (BID) | ORAL | Status: DC
  Administered 2013-11-15 – 2013-11-16 (×2): 40 mg via ORAL
  Filled 2013-11-15 (×3): qty 1

## 2013-11-15 NOTE — Progress Notes (Signed)
Pt reports that he has been having headaches for 3 days. Pt reports that the headaches come in the afternoon; pt denies light/noise sensitivity or nausea with the headaches. Pt states that this is abnormal for him and that he rarely ever gets headaches. MD aware. Will continue to monitor.

## 2013-11-15 NOTE — Progress Notes (Signed)
UR completed 

## 2013-11-15 NOTE — Progress Notes (Signed)
CSW (Clinical Child psychotherapist) received consult. CSW unable to assist with Medicaid. CSW placed call to financial counselor. Please reconsult if social work needs arise.  Ellise Kovack, LCSWA [phone]

## 2013-11-15 NOTE — Plan of Care (Signed)
Problem: Consults Goal: Skin Care Protocol Initiated - if Braden Score 18 or less If consults are not indicated, leave blank or document N/A Outcome: Not Applicable Date Met:  85/02/77 Goal: Nutrition Consult-if indicated Outcome: Not Applicable Date Met:  41/28/78 Goal: Diabetes Guidelines if Diabetic/Glucose > 140 If diabetic or lab glucose is > 140 mg/dl - Initiate Diabetes/Hyperglycemia Guidelines & Document Interventions  Outcome: Not Applicable Date Met:  67/67/20  Problem: Phase I Progression Outcomes Goal: Hemodynamically stable Outcome: Completed/Met Date Met:  11/15/13 Goal: Anginal pain relieved Outcome: Completed/Met Date Met:  11/15/13 Goal: Voiding-avoid urinary catheter unless indicated Outcome: Completed/Met Date Met:  11/15/13

## 2013-11-15 NOTE — Plan of Care (Signed)
Problem: Phase II Progression Outcomes Goal: Anginal pain relieved Outcome: Completed/Met Date Met:  11/15/13

## 2013-11-15 NOTE — Consult Note (Signed)
Consultation  Referring Provider:  [facility]    Primary Care Physician:  Jeanann Lewandowsky, MD Primary Gastroenterologist:  none       Reason for Consultation: Anemia             HPI:   Jason Foster is a 53 y.o. male from [place] (in states for 25 years) with a history of a STEMI late January 2015, s/p DES x2. Patient has been on a daily ASA/Brillinta since January. He was admitted yesterday for chest pain. Troponins negative. Hgb found to be 8, it was 14.8 in January. He was Heme negative in ED. He had three days of black stools prior to admission. No abdominal pain. No nausea. Weight is stable. Other than black stools, BMs have been normal.  Past Medical History  Diagnosis Date  . Hypertension   . CAD (coronary artery disease)     a. Ant STEMI (01/2013):  LHC - dLM 20, pLAD 100 (DES x 2), CFX < 20, mRCA 100% (L-R collats)  . Ischemic cardiomyopathy     a. echo (01/2013):  mild LVH, EF 35-40%, AS and apical AK, PASP 34  . HLD (hyperlipidemia)   . ST elevation (STEMI) myocardial infarction involving left anterior descending coronary artery 01/2013    Past Surgical History  Procedure Laterality Date  . Pilonidal cyst excision  ~ 1995  . Coronary angioplasty with stent placement  01/2013    [facility]: No colon cancer, no stomach cancers.   History  Substance Use Topics  . Smoking status: Former Smoker -- 0.50 packs/day for 34 years    Types: Cigarettes    Quit date: 04/13/2012  . Smokeless tobacco: Never Used  . Alcohol Use: No    Prior to Admission medications   Medication Sig Start Date End Date Taking? Authorizing Provider  aspirin EC 81 MG tablet Take 1 tablet (81 mg total) by mouth daily. 03/31/13  Yes Richarda Overlie, MD  atorvastatin (LIPITOR) 80 MG tablet Take 1 tablet (80 mg total) by mouth daily at 6 PM. 03/31/13  Yes Richarda Overlie, MD  carvedilol (COREG) 6.25 MG tablet Take 1 tablet (6.25 mg total) by mouth 2 (two) times daily with a meal. 03/31/13  Yes Richarda Overlie,  MD  lisinopril (PRINIVIL,ZESTRIL) 20 MG tablet Take 1 tablet (20 mg total) by mouth daily. 03/31/13  Yes Richarda Overlie, MD  nitroGLYCERIN (NITROSTAT) 0.4 MG SL tablet Place 1 tablet (0.4 mg total) under the tongue every 5 (five) minutes as needed for chest pain. 02/12/13  Yes Beatrice Lecher, PA-C  sildenafil (VIAGRA) 50 MG tablet Take 1 tablet (50 mg total) by mouth daily as needed for erectile dysfunction. 04/06/13  Yes Quentin Angst, MD  spironolactone (ALDACTONE) 25 MG tablet TAKE 1/2 TABLET BY MOUTH DAILY. 10/17/13  Yes Jonelle Sidle, MD  Ticagrelor (BRILINTA) 90 MG TABS tablet Take 1 tablet (90 mg total) by mouth 2 (two) times daily. 03/31/13  Yes Richarda Overlie, MD    Current Facility-Administered Medications  Medication Dose Route Frequency Provider Last Rate Last Dose  . 0.9 %  sodium chloride infusion  250 mL Intravenous PRN Dyann Kief, PA-C      . 0.9 %  sodium chloride infusion   Intravenous Continuous Dyann Kief, PA-C 75 mL/hr at 11/15/13 1400    . acetaminophen (TYLENOL) tablet 650 mg  650 mg Oral Q4H PRN Rhonda G Barrett, PA-C      . ALPRAZolam (XANAX) tablet 0.25 mg  0.25 mg Oral BID PRN Joline Salt Barrett, PA-C      . aspirin EC tablet 81 mg  81 mg Oral Daily Rhonda G Barrett, PA-C   81 mg at 11/15/13 0945  . atorvastatin (LIPITOR) tablet 80 mg  80 mg Oral q1800 Rhonda G Barrett, PA-C      . carvedilol (COREG) tablet 6.25 mg  6.25 mg Oral BID WC Rhonda G Barrett, PA-C   6.25 mg at 11/15/13 0753  . lisinopril (PRINIVIL,ZESTRIL) tablet 20 mg  20 mg Oral Daily Rhonda G Barrett, PA-C   20 mg at 11/15/13 0946  . ondansetron (ZOFRAN) injection 4 mg  4 mg Intravenous Q6H PRN Rhonda G Barrett, PA-C      . sodium chloride 0.9 % injection 3 mL  3 mL Intravenous Q12H Dyann Kief, PA-C   3 mL at 11/15/13 0948  . sodium chloride 0.9 % injection 3 mL  3 mL Intravenous PRN Dyann Kief, PA-C      . spironolactone (ALDACTONE) tablet 12.5 mg  12.5 mg Oral Daily Rhonda G Barrett,  PA-C   12.5 mg at 11/15/13 0947  . [START ON 11/16/2013] ticagrelor (BRILINTA) tablet 90 mg  90 mg Oral BID Ardis Rowan, MD      . zolpidem [facility]) tablet 5 mg  5 mg Oral QHS PRN,MR X 1 Rhonda G Barrett, PA-C        Allergies as of 11/14/2013  . (No Known Allergies)    Review of Systems:    All systems reviewed and negative except where noted in HPI.    Physical Exam:  Vital signs in last 24 hours: Temp:  [98.3 F (36.8 C)-99.2 F (37.3 C)] 99.2 F (37.3 C) (11/03 1400) Pulse Rate:  [51-79] 51 (11/03 1400) Resp:  [18-20] 18 (11/03 1400) BP: (101-111)/(63-67) 101/67 mmHg (11/03 1400) SpO2:  [100 %] 100 % (11/03 1400) Weight:  [197 lb 8 oz (89.585 kg)] 197 lb 8 oz (89.585 kg) (11/03 0500) Last BM Date: 11/14/13 General:   Pleasant male in NAD Head:  Normocephalic and atraumatic. Eyes:   No icterus.   Conjunctiva pink. Ears:  Normal auditory acuity. Neck:  Supple; no masses felt Lungs:  Respirations even and unlabored. Lungs clear to auscultation bilaterally.   No wheezes, crackles, or rhonchi.  Heart:  Regular rate and rhythm;  murmur heard. Abdomen:  Soft, nondistended, nontender. Normal bowel sounds. No appreciable masses or hepatomegaly.  Msk:  Symmetrical without gross deformities.  Extremities:  Without edema. Neurologic:  Alert and  oriented x4;  grossly normal neurologically. Skin:  Intact without significant lesions or rashes. Cervical Nodes:  No significant cervical adenopathy. Psych:  Alert and cooperative. Normal affect.  LAB RESULTS:  Recent Labs  11/14/13 1455 11/14/13 2333 11/15/13 0525  WBC 9.0 8.4 8.9  HGB 8.0* 7.6* 7.7*  HCT 25.2* 23.2* 24.4*  PLT 264 226 243   BMET  Recent Labs  11/14/13 1455 11/15/13 0525  NA 137 139  K 4.4 4.5  CL 103 106  CO2 21 22  GLUCOSE 96 98  BUN 14 17  CREATININE 0.92 0.96  CALCIUM 8.6 8.3*   LFT  Recent Labs  11/14/13 1455  PROT 7.0  ALBUMIN 3.4*  AST 19  ALT 18  ALKPHOS 94  BILITOT 0.3    PT/INR  Recent Labs  11/14/13 1455  LABPROT 16.0*  INR 1.26    PREVIOUS ENDOSCOPIES:            none  Impression / Plan:   301. 53 year old male with severe [place] anemia in setting of ASA / Brillinta, new since January 2015 . Hgb down from 15 in January 2015 to 7.7 today. Patient reports a 3 days of black stool prior to admission. Presumably this is an upper GI bleed but it is nteresting he was heme negative in ED and BUN is normal. Last dose of Brillinta was yesterday (needs 5 day wash out) but we are okay preceding with diagnostic EGD in am. If EGD negative then patient will need a colonoscopy. Ideally patient would be off Brillinta for 5 days prior to a colonoscopy but this may not be reasonable given that cardiac stents were placed less than a year ago. Will start with am EGD and go from there. Will add PO PPI  2. Anemia of acute blood loss. Hgb down about 7 grams !  We discussed blood transfusion, he would like to raise counts "naturally". I did explain potential for detrimental effects of anemia on the heart. He will think more about a blood transfusion in case hgb continues to decline.   3. CAD / STEMI / DESx2 in January 2015.    Thanks   LOS: 1 day   Willette Clusteraula Guenther  11/15/2013, 5:00 PM

## 2013-11-15 NOTE — Progress Notes (Signed)
Patient Name: Valora CorporalMohamed A Nicotra Date of Encounter: 11/15/2013     Principal Problem:   Unstable angina Active Problems:   Essential hypertension, benign   Dyslipidemia   Cardiomyopathy, ischemic   ACS (acute coronary syndrome)    SUBJECTIVE  No further chest pain; feeling fine. Will get us a stool sample as soon as possible  CURRENT MEDS . aspirin EC  81 mg Oral Daily  . atorvastatin  80 mg Oral q1800  . carvedilol  6.25 mg Oral BID WC  . lisinopril  20 mg Oral Daily  . sodium chloride  3 mL Intravenous Q12H  . spironolactone  12.5 mg Oral Daily  . [START ON 11/16/2013] ticagrelor  90 mg Oral BID    OBJECTIVE  Filed Vitals:   11/14/13 1417 11/14/13 2011 11/15/13 0500  BP: 103/62 107/64 104/66  Pulse: 75 79 76  Temp: 98.8 F (37.1 C) 98.3 F (36.8 C) 98.7 F (37.1 C)  TempSrc: Oral Oral Oral  Resp: 20 20 18   Height: 5\' 6"  (1.676 m)    Weight: 200 lb 6.4 oz (90.9 kg)  197 lb 8 oz (89.585 kg)  SpO2: 100% 100% 100%    Intake/Output Summary (Last 24 hours) at 11/15/13 0751 Last data filed at 11/15/13 0500  Gross per 24 hour  Intake   1530 ml  Output    675 ml  Net    855 ml   Filed Weights   11/14/13 1417 11/15/13 0500  Weight: 200 lb 6.4 oz (90.9 kg) 197 lb 8 oz (89.585 kg)    PHYSICAL EXAM  General: Pleasant, NAD. Neuro: Alert and oriented X 3. Moves all extremities spontaneously. Psych: Normal affect. HEENT:  Normal  Neck: Supple without bruits or JVD. Lungs:  Resp regular and unlabored, CTA. Heart: RRR no s3, s4, or murmurs. Abdomen: Soft, non-tender, non-distended, BS + x 4.  Extremities: No clubbing, cyanosis or edema. DP/PT/Radials 2+ and equal bilaterally.  Accessory Clinical Findings  CBC  Recent Labs  11/14/13 1455 11/14/13 2333 11/15/13 0525  WBC 9.0 8.4 8.9  NEUTROABS 5.2  --   --   HGB 8.0* 7.6* 7.7*  HCT 25.2* 23.2* 24.4*  MCV 81.3 81.1 82.2  PLT 264 226 243   Basic Metabolic Panel  Recent Labs  11/14/13 1455  11/15/13 0525  NA 137 139  K 4.4 4.5  CL 103 106  CO2 21 22  GLUCOSE 96 98  BUN 14 17  CREATININE 0.92 0.96  CALCIUM 8.6 8.3*  MG 2.0  --    Liver Function Tests  Recent Labs  11/14/13 1455  AST 19  ALT 18  ALKPHOS 94  BILITOT 0.3  PROT 7.0  ALBUMIN 3.4*    Cardiac Enzymes  Recent Labs  11/14/13 1815 11/14/13 2333 11/15/13 0525  TROPONINI <0.30 <0.30 <0.30   Fasting Lipid Panel  Recent Labs  11/15/13 0525  CHOL 97  HDL 22*  LDLCALC 38  TRIG [id number]*  CHOLHDL 4.4   Thyroid Function Tests  Recent Labs  11/14/13 1455  TSH 0.756    TELE  NSR  Radiology/Studies  No results found.  ASSESSMENT AND PLAN This is a 53 year old male patient who has a history of an anterior STEMI on 02/10/13 treated with DES x2 the LAD, ICM ( EF 35-40%--> now improved to 55-60%) HTN, HLD, and hx of tobacco abuse who was admitted from the office yesterday for chest pain concerning for [place].   Unstable angina/CAD s/p anterior STEMI on 02/10/13  treated with DES x2 the LAD.  Cath at that time revealed a totally occluded RCA that appeared to be chronic and was collateralized. -- Had only been taking his Brilinta 1x/day -- Troponin neg x 3. -- Heparin gtt discontinued w/ possible GI bleed. Continue ASA/Brilinta, statin, BB and ACE -- Was planning on LHC today but he was noted to have a HG of 8--> now 7.7 and cath placed on hold.   Possible GI bleed- patient with declining HG and reports 3days of black tarry stool -- FOBT pending. May need a GI consult  HLD- TC 97; TG 185; HDL 22; LDL 38.  -- Continue statin  Signed, Janetta Hora PA-C  Pager [phone]

## 2013-11-15 NOTE — Progress Notes (Signed)
Pt had no chest pain today, pt agrees to plan of care. Pt signed consent form for EGD procedure tomorrow. Family at bedside. Report given to night nurse. Cecille Rubin, RN

## 2013-11-16 ENCOUNTER — Encounter (HOSPITAL_COMMUNITY): Payer: Self-pay

## 2013-11-16 ENCOUNTER — Encounter (HOSPITAL_COMMUNITY)
Admission: AD | Disposition: A | Discharge status: Home / Self Care | Payer: Self-pay | Source: Ambulatory Visit | Attending: Cardiology

## 2013-11-16 ENCOUNTER — Telehealth: Payer: Self-pay | Admitting: Nurse Practitioner

## 2013-11-16 DIAGNOSIS — K269 Duodenal ulcer, unspecified as acute or chronic, without hemorrhage or perforation: Secondary | ICD-10-CM

## 2013-11-16 DIAGNOSIS — I10 Essential (primary) hypertension: Secondary | ICD-10-CM

## 2013-11-16 HISTORY — PX: ESOPHAGOGASTRODUODENOSCOPY: SHX5428

## 2013-11-16 LAB — CBC
HCT: 25.3 % — ABNORMAL LOW (ref 39.0–52.0)
Hemoglobin: 8 g/dL — ABNORMAL LOW (ref 13.0–17.0)
MCH: 25.9 pg — ABNORMAL LOW (ref 26.0–34.0)
MCHC: 31.6 g/dL (ref 30.0–36.0)
MCV: 81.9 fL (ref 78.0–100.0)
Platelets: 269 10*3/uL (ref 150–400)
RBC: 3.09 MIL/uL — ABNORMAL LOW (ref 4.22–5.81)
RDW: 15.6 % — ABNORMAL HIGH (ref 11.5–15.5)
WBC: 8.1 10*3/uL (ref 4.0–10.5)

## 2013-11-16 LAB — IRON AND TIBC
Iron: 45 ug/dL (ref 42–135)
Saturation Ratios: 14 % — ABNORMAL LOW (ref 20–55)
TIBC: 330 ug/dL (ref 215–435)
UIBC: 285 ug/dL (ref 125–400)

## 2013-11-16 LAB — VITAMIN B12: Vitamin B-12: 324 pg/mL (ref 211–911)

## 2013-11-16 LAB — FERRITIN: Ferritin: 38 ng/mL (ref 22–322)

## 2013-11-16 LAB — TRANSFERRIN: TRANSFERRIN: 273 mg/dL (ref 200–360)

## 2013-11-16 LAB — FOLATE: FOLATE: 19.5 ng/mL

## 2013-11-16 SURGERY — EGD (ESOPHAGOGASTRODUODENOSCOPY)
Anesthesia: Moderate Sedation

## 2013-11-16 MED ORDER — DOCUSATE SODIUM 100 MG PO CAPS: 100.0000 mg | ORAL_CAPSULE | Freq: Every day | ORAL | Status: DC

## 2013-11-16 MED ORDER — PANTOPRAZOLE SODIUM 40 MG PO TBEC: 40.0000 mg | DELAYED_RELEASE_TABLET | Freq: Two times a day (BID) | ORAL | Status: DC

## 2013-11-16 MED ORDER — ACETAMINOPHEN 325 MG PO TABS: 650.0000 mg | ORAL_TABLET | ORAL | Status: DC | PRN

## 2013-11-16 MED ORDER — POLYSACCHARIDE IRON COMPLEX 150 MG PO CAPS: 150.0000 mg | ORAL_CAPSULE | Freq: Two times a day (BID) | ORAL | Status: DC

## 2013-11-16 MED ORDER — MIDAZOLAM HCL 10 MG/2ML IJ SOLN
INTRAMUSCULAR | Status: DC | PRN
  Administered 2013-11-16 (×2): 1 mg via INTRAVENOUS
  Administered 2013-11-16: 2 mg via INTRAVENOUS
  Administered 2013-11-16: 1 mg via INTRAVENOUS

## 2013-11-16 MED ORDER — BUTAMBEN-TETRACAINE-BENZOCAINE 2-2-14 % EX AERO
INHALATION_SPRAY | CUTANEOUS | Status: DC | PRN
  Administered 2013-11-16: 2 via TOPICAL

## 2013-11-16 MED ORDER — FENTANYL CITRATE 0.05 MG/ML IJ SOLN
INTRAMUSCULAR | Status: DC | PRN
  Administered 2013-11-16: 25 ug via INTRAVENOUS

## 2013-11-16 MED ORDER — DIPHENHYDRAMINE HCL 50 MG/ML IJ SOLN
INTRAMUSCULAR | Status: AC
  Filled 2013-11-16: qty 1

## 2013-11-16 MED ORDER — MIDAZOLAM HCL 5 MG/ML IJ SOLN
INTRAMUSCULAR | Status: AC
Start: 2013-11-16 — End: 2013-11-16
  Filled 2013-11-16: qty 2

## 2013-11-16 MED ORDER — DSS 100 MG PO CAPS: 100.0000 mg | ORAL_CAPSULE | Freq: Every day | ORAL | Status: DC

## 2013-11-16 MED ORDER — FENTANYL CITRATE 0.05 MG/ML IJ SOLN
INTRAMUSCULAR | Status: AC
  Filled 2013-11-16: qty 2

## 2013-11-16 NOTE — Progress Notes (Signed)
Pt discharged, ambulatory, condition stable, accompanied by family. 

## 2013-11-16 NOTE — Op Note (Signed)
[facility] [address] [place], [address]   ENDOSCOPY PROCEDURE REPORT  PATIENT: Jason Foster, Jason Foster  MR#: [id number] BIRTHDATE: 1960-10-02 , 53  yrs. old GENDER: male ENDOSCOPIST: Beverley Fiedler, MD REFERRED BY:  Tobias Alexander, MD PROCEDURE DATE:  11/16/2013 PROCEDURE:  EGD, diagnostic ASA CLASS:     Class III INDICATIONS:  anemia. MEDICATIONS: Fentanyl 25 mcg IV and Versed 5 mg IV TOPICAL ANESTHETIC: Cetacaine Spray  DESCRIPTION OF PROCEDURE: After the risks benefits and alternatives of the procedure were thoroughly explained, informed consent was obtained.  The EG-[id number] ([id number]) endoscope was introduced through the mouth and advanced to the second portion of the duodenum , Without limitations.  The instrument was slowly withdrawn as the mucosa was fully examined.  ESOPHAGUS: The mucosa of the esophagus appeared normal.   Small papilloma in the proximal esophagus.  STOMACH: Mild erosive gastritis (inflammation) was found in the prepyloric region of the stomach and distal gastric antrum.  DUODENUM: A medium sized non-bleeding non-bleeding and clean-based ulcer was found in the duodenal sweep.   Moderate duodenal inflammation with erosions was found in the duodenal bulb and sweep.  The 2nd portion of the duodenum was unremarkable.  Retroflexed views revealed no abnormalities.     The scope was then withdrawn from the patient and the procedure completed.  COMPLICATIONS: There were no immediate complications.  ENDOSCOPIC IMPRESSION: 1.   The mucosa of the esophagus appeared normal 2.   Erosive gastritis (inflammation) was found in the prepyloric region of the stomach and gastric antrum 3.   Medium sized non-bleeding ulcer was found in the duodenal bulb with erosive duodenitis, likely cause of recent melena/anemia  RECOMMENDATIONS: 1.  Twice daily PPI for at least 4 weeks, then daily thereafter 2.  Check H.  pylori serum Ab and treat if  positive 3.  Monitor Hgb, replace iron if needed 4.  Outpatient colonoscopy when Brilinta can be stopped to allow for procedure 5.  Avoid NSAIDs   eSigned:  Beverley Fiedler, MD 11/16/2013 12:46 PM    CC:The Patient  PATIENT NAME:  [name], [name] MR#: [id number]

## 2013-11-16 NOTE — Discharge Summary (Signed)
Physician Discharge Summary       Patient ID: Jason Foster MRN: [id number] DOB/AGE: July 26, 1960 53 y.o.  Admit date: 11/14/2013 Discharge date: 11/16/2013  Discharge Diagnoses:  Principal Problem:   Unstable angina Active Problems:   Essential hypertension, benign   Dyslipidemia   Cardiomyopathy, ischemic   ACS (acute coronary syndrome)   Absolute anemia   Melena   Acute blood loss anemia   Coronary artery disease involving native coronary artery of native heart with other form of angina pectoris   Duodenal ulcer disease   Discharged Condition: good  Procedures: EGD 11/16/13 by Dr. Rhea Belton  Primary Cardiologist:  Dr. P. [place]  Hospital Course: 53 year old male patient who has a history of an anterior STEMI on 02/10/13 treated with drug-eluting stent x2 the LAD. He also has hypertension hyperlipidemia. He quit smoking proximally one half years ago. At cath he also had a totally occluded RCA that appeared to be chronic and was collateralized. EF 35-40%. Followup 2-D echo on 3/23/15showed normalization of LV function EF 55-60% with grade 1 diastolic dysfunction.Patient also has history of hypertension and hyperlipidemia.  The patient walked in our office 11/14/13 to schedule appointment because he had chest pain. He is on Brilinta and is only taking it once a day for the past 9 months. He also says his carvedilol was switched to once a day drug use he can't remember to take pills twice a day. He complains of exertional chest pain everyday for the past 4 days. Day prior to admit he was carrying something that weighed about 20 pounds and he developed chest tightness radiating down both arms. He had no associated dyspnea, diaphoresis, dizziness or presyncope. Patient says the pain only lasted 1 minute and went away when he stopped. He did not use nitroglycerin.  ION:GEXBMW sinus rhythm at 75 beats per minute old old anterior septal infarct, no acute change.  He was admitted to [facility] for  further eval.  Plan had been for cath but Hgb was 8.0. Cath cancelled and GI consult obtained.  + black tarry stools for 3 days. By !!/3/15, no further pain and troponins were negative.  EGD found erosive gastritis was found in the prepyloric region of the stomach and gastric antrum.   Medium sized non-bleeding ulcer was found in the duodenal bulb with erosive duodenitis, likely cause of recent melena/anemia.    Plan for PPI for at least 4 weeks.  H.Pylori is pending. Monitor H/H.  Avoid NSAIDS.  Outpt colonoscopy will be needed when Brilinta can be held. Iron and TIBC pending at discharge.  We have started NuIron 150 mg BID and colace.  Pt seen by Dr. Rennis Golden and found stable for discharge.  He will follow up next week and follow up CBC next week.   No cath for now, if chest pain returns will need to reevaluate.  Consults: GI  Significant Diagnostic Studies:   BMET    Component Value Date/Time   NA 139 11/15/2013 0525   K 4.5 11/15/2013 0525   CL 106 11/15/2013 0525   CO2 22 11/15/2013 0525   GLUCOSE 98 11/15/2013 0525   BUN 17 11/15/2013 0525   CREATININE 0.96 11/15/2013 0525   CREATININE 0.98 11/29/2012 1304   CALCIUM 8.3* 11/15/2013 0525   GFRNONAA >90 11/15/2013 0525   GFRNONAA 87 11/29/2012 1304   GFRAA >90 11/15/2013 0525   GFRAA >89 11/29/2012 1304    CBC    Component Value Date/Time   WBC 8.1 11/16/2013 0553  RBC 3.09* 11/16/2013 0553   HGB 8.0* 11/16/2013 0553   HCT 25.3* 11/16/2013 0553   PLT 269 11/16/2013 0553   MCV 81.9 11/16/2013 0553   MCH 25.9* 11/16/2013 0553   MCHC 31.6 11/16/2013 0553   RDW 15.6* 11/16/2013 0553   LYMPHSABS 3.0 11/14/2013 1455   MONOABS 0.6 11/14/2013 1455   EOSABS 0.1 11/14/2013 1455   BASOSABS 0.0 11/14/2013 1455   Troponin I <0.30 X 4 Iron 45 UIBC 285 TIBC 330 Sat ratio 14 Ferritin 38 Folate 19.5  B12 324 TSH 0.756  EKG: Normal sinus rhythm Low voltage QRS Anteroseptal infarct , age undetermined Abnormal ECG Since last  tracing rate slower  Lipid Panel     Component Value Date/Time   CHOL 97 11/15/2013 0525   TRIG 185* 11/15/2013 0525   HDL 22* 11/15/2013 0525   CHOLHDL 4.4 11/15/2013 0525   VLDL 37 11/15/2013 0525   LDLCALC 38 11/15/2013 0525     Discharge Exam: Blood pressure 108/65, pulse 71, temperature 98.3 F (36.8 C), temperature source Oral, resp. rate 18, height 5\' 6"  (1.676 m), weight 210 lb 9.6 oz (95.528 kg), SpO2 99 %.  Disposition: 01-Home or Self Care     Medication List    STOP taking these medications        sildenafil 50 MG tablet  Commonly known as:  VIAGRA      TAKE these medications        acetaminophen 325 MG tablet  Commonly known as:  TYLENOL  Take 2 tablets (650 mg total) by mouth every 4 (four) hours as needed for headache or mild pain.     aspirin EC 81 MG tablet  Take 1 tablet (81 mg total) by mouth daily.     atorvastatin 80 MG tablet  Commonly known as:  LIPITOR  Take 1 tablet (80 mg total) by mouth daily at 6 PM.     carvedilol 6.25 MG tablet  Commonly known as:  COREG  Take 1 tablet (6.25 mg total) by mouth 2 (two) times daily with a meal.     DSS 100 MG Caps  Take 100 mg by mouth at bedtime.     iron polysaccharides 150 MG capsule  Commonly known as:  NIFEREX  Take 1 capsule (150 mg total) by mouth 2 (two) times daily.     lisinopril 20 MG tablet  Commonly known as:  PRINIVIL,ZESTRIL  Take 1 tablet (20 mg total) by mouth daily.     nitroGLYCERIN 0.4 MG SL tablet  Commonly known as:  NITROSTAT  Place 1 tablet (0.4 mg total) under the tongue every 5 (five) minutes as needed for chest pain.     pantoprazole 40 MG tablet  Commonly known as:  PROTONIX  Take 1 tablet (40 mg total) by mouth 2 (two) times daily.     spironolactone 25 MG tablet  Commonly known as:  ALDACTONE  TAKE 1/2 TABLET BY MOUTH DAILY.     ticagrelor 90 MG Tabs tablet  Commonly known as:  BRILINTA  Take 1 tablet (90 mg total) by mouth 2 (two) times daily.        Follow-up Information    Follow up with Yancey FlemingsJohn Perry, MD On 01/18/2014.   Specialty:  Gastroenterology   Why:  0830 appointment to follow up anemia and ulcer.     Contact information:   [address] [place] [address] [phone]       Follow up with Nicolasa Duckinghristopher Berge, NP On  11/23/2013.   Specialty:  Nurse Practitioner   Why:  at 10:00 AM   Contact information:   [address] [place] [address] [phone]        Discharge Instructions: Heart Healthy diet  If recurrent chest pain call the office to be seen if severe go to ER.  Follow up next week.  We will recheck you blood count.   Signed: Leone Brand Nurse Practitioner-Certified [facility]: [facility] 11/16/2013, 3:42 PM  Time spent on discharge : >35 minutes.

## 2013-11-16 NOTE — Plan of Care (Signed)
Problem: Consults Goal: Tobacco Cessation referral if indicated Outcome: Not Applicable Date Met:  09/81/19  Problem: Phase I Progression Outcomes Goal: MD aware of Cardiac Marker results Outcome: Completed/Met Date Met:  11/16/13  Problem: Phase II Progression Outcomes Goal: Hemodynamically stable Outcome: Completed/Met Date Met:  11/16/13

## 2013-11-16 NOTE — Telephone Encounter (Signed)
New message      TCM appt made on 11-23-13 with Ward Givens.

## 2013-11-16 NOTE — Progress Notes (Signed)
Subjective: Not in room  Objective: Vital signs in last 24 hours: Temp:  [98.1 F (36.7 C)-99.4 F (37.4 C)] 98.8 F (37.1 C) (11/04 0827) Pulse Rate:  [51-98] 71 (11/04 0827) Resp:  [16-18] 16 (11/04 0446) BP: (97-111)/(63-67) 111/65 mmHg (11/04 0827) SpO2:  [98 %-100 %] 99 % (11/04 0827) Weight:  [210 lb 9.6 oz (95.528 kg)] 210 lb 9.6 oz (95.528 kg) (11/04 0446) Weight change: 10 lb 3.2 oz (4.628 kg) Last BM Date: 11/15/13 Intake/Output from previous day: +1815 11/03 0701 - 11/04 0700 In: 2415 [P.O.:840; I.V.:1575] Out: 750 [Urine:750] Intake/Output this shift: Total I/O In: 0  Out: 200 [Urine:200]  PE: not in room - for EGD   Lab Results:  Recent Labs  11/15/13 0525 11/16/13 0553  WBC 8.9 8.1  HGB 7.7* 8.0*  HCT 24.4* 25.3*  PLT 243 269   BMET  Recent Labs  11/14/13 1455 11/15/13 0525  NA 137 139  K 4.4 4.5  CL 103 106  CO2 21 22  GLUCOSE 96 98  BUN 14 17  CREATININE 0.92 0.96  CALCIUM 8.6 8.3*    Recent Labs  11/14/13 2333 11/15/13 0525  TROPONINI <0.30 <0.30    Lab Results  Component Value Date   CHOL 97 11/15/2013   HDL 22* 11/15/2013   LDLCALC 38 11/15/2013   TRIG 185* 11/15/2013   CHOLHDL 4.4 11/15/2013   Lab Results  Component Value Date   HGBA1C 6.1* 02/10/2013     Lab Results  Component Value Date   TSH 0.756 11/14/2013    Hepatic Function Panel  Recent Labs  11/14/13 1455  PROT 7.0  ALBUMIN 3.4*  AST 19  ALT 18  ALKPHOS 94  BILITOT 0.3    Recent Labs  11/15/13 0525  CHOL 97   No results for input(s): PROTIME in the last 72 hours.     Studies/Results: No results found.  Medications: I have reviewed the patient's current medications. Scheduled Meds: . aspirin EC  81 mg Oral Daily  . atorvastatin  80 mg Oral q1800  . carvedilol  6.25 mg Oral BID WC  . lisinopril  20 mg Oral Daily  . pantoprazole  40 mg Oral BID  . sodium chloride  3 mL Intravenous Q12H  . spironolactone  12.5 mg  Oral Daily  . ticagrelor  90 mg Oral BID   Continuous Infusions: . sodium chloride 75 mL/hr at 11/15/13 1900   PRN Meds:.sodium chloride, acetaminophen, ALPRAZolam, ondansetron (ZOFRAN) IV, sodium chloride, zolpidem  Assessment/Plan: This is a 53 year old male patient who has a history of an anterior STEMI on 02/10/13 treated with DES x2 the LAD, ICM ( EF 35-40%--> now improved to 55-60%) HTN, HLD, and hx of tobacco abuse who was admitted from the office 11/14/13  for chest pain concerning for [place].   Unstable angina/CAD s/p anterior STEMI on 02/10/13 treated with DES x2 the LAD. Cath at that time revealed a totally occluded RCA that appeared to be chronic and was collateralized. -- Had only been taking his Brilinta 1x/day -- Troponin neg x 3. -- Heparin gtt discontinued w/ possible GI bleed. Continue ASA/Brilinta, statin, BB and ACE -- Was planning on LHC today but he was noted to have a HG of 8--> now 7.7 and cath placed on hold.   Possible GI bleed- patient with declining HG and reports 3days of black tarry stool -- FOBT pending. GI has seen for EGD today-- will need screening  colonoscopy once DAPT can be held.  HLD- TC 97; TG 185; HDL 22; LDL 38.  -- Continue statin   LOS: 2 days   Time spent with pt. :15 minutes. Bloom[facility] Meadows HospitalNGOLD,LAURA R  Nurse Practitioner Certified Pager [phone] or after 5pm and on weekends call [phone] 11/16/2013, 10:22 AM

## 2013-11-16 NOTE — Discharge Instructions (Signed)
Heart Healthy diet  If recurrent chest pain call the office to be seen if severe go to ER.  Follow up next week.  We will recheck you blood count.

## 2013-11-17 ENCOUNTER — Encounter (HOSPITAL_COMMUNITY): Payer: Self-pay | Admitting: Internal Medicine

## 2013-11-17 NOTE — Telephone Encounter (Signed)
Patient contacted regarding discharge from [facility] on 11/16/2013.  Patient understands to follow up with provider Christain Sacramento NP on 11/23/13 at 10:00 AM at [facility] street office. Patient understands discharge instructions? yes Patient understands medications and regiment? yes Patient understands to bring all medications to this visit? yes

## 2013-11-23 ENCOUNTER — Ambulatory Visit (INDEPENDENT_AMBULATORY_CARE_PROVIDER_SITE_OTHER): Payer: Medicaid Other | Admitting: Nurse Practitioner

## 2013-11-23 ENCOUNTER — Telehealth: Payer: Self-pay | Admitting: Cardiology

## 2013-11-23 ENCOUNTER — Other Ambulatory Visit: Payer: Self-pay | Admitting: Cardiology

## 2013-11-23 ENCOUNTER — Encounter: Payer: Self-pay | Admitting: Nurse Practitioner

## 2013-11-23 VITALS — BP 109/69 | HR 81 | Ht 67.0 in | Wt 201.6 lb

## 2013-11-23 DIAGNOSIS — K2960 Other gastritis without bleeding: Secondary | ICD-10-CM | POA: Insufficient documentation

## 2013-11-23 DIAGNOSIS — K269 Duodenal ulcer, unspecified as acute or chronic, without hemorrhage or perforation: Secondary | ICD-10-CM

## 2013-11-23 DIAGNOSIS — K2980 Duodenitis without bleeding: Secondary | ICD-10-CM | POA: Insufficient documentation

## 2013-11-23 DIAGNOSIS — I10 Essential (primary) hypertension: Secondary | ICD-10-CM

## 2013-11-23 DIAGNOSIS — E785 Hyperlipidemia, unspecified: Secondary | ICD-10-CM | POA: Insufficient documentation

## 2013-11-23 DIAGNOSIS — I2510 Atherosclerotic heart disease of native coronary artery without angina pectoris: Secondary | ICD-10-CM | POA: Insufficient documentation

## 2013-11-23 DIAGNOSIS — D500 Iron deficiency anemia secondary to blood loss (chronic): Secondary | ICD-10-CM

## 2013-11-23 DIAGNOSIS — I255 Ischemic cardiomyopathy: Secondary | ICD-10-CM | POA: Insufficient documentation

## 2013-11-23 DIAGNOSIS — D649 Anemia, unspecified: Secondary | ICD-10-CM | POA: Insufficient documentation

## 2013-11-23 LAB — CBC
HCT: 30.2 % — ABNORMAL LOW (ref 39.0–52.0)
Hemoglobin: 9.7 g/dL — ABNORMAL LOW (ref 13.0–17.0)
MCHC: 32 g/dL (ref 30.0–36.0)
MCV: 80.9 fl (ref 78.0–100.0)
Platelets: 322 10*3/uL (ref 150.0–400.0)
RBC: 3.73 Mil/uL — ABNORMAL LOW (ref 4.22–5.81)
RDW: 15.7 % — AB (ref 11.5–15.5)
WBC: 8.6 10*3/uL (ref 4.0–10.5)

## 2013-11-23 NOTE — Telephone Encounter (Signed)
Need a new prescription for Niferex 150 mg #??

## 2013-11-23 NOTE — Patient Instructions (Signed)
Your physician recommends that you continue on your current medications as directed. Please refer to the Current Medication list given to you today.  Your physician recommends that you return for lab work in:  TODAY CBC   Your physician recommends that you schedule a follow-up appointment in:  WITH DR Swaz[place]and IN 3 MONTHS

## 2013-11-23 NOTE — Progress Notes (Signed)
Patient Name: Jason Foster Date of Encounter: 11/23/2013  Primary Care Provider:  Jeanann LewandowskyJEGEDE, OLUGBEMIGA, MD Primary Cardiologist:  P. Sw[place]Jordan, MD   Patient Profile  53 y/o male with h/o CAD and recent admission for c/p where he was found to be anemic req GI eval.  Problem List   Past Medical History  Diagnosis Date  . Hypertension   . CAD (coronary artery disease)     a. Ant STEMI (01/2013):  LHC - dLM 20, pLAD 100 (DES x 2), CFX < 20, mRCA 100% (L-R collats)  . Ischemic cardiomyopathy     a. echo (01/2013):  mild LVH, EF 35-40%, AS and apical AK, PASP 34  . HLD (hyperlipidemia)   . Erosive gastritis     a. 11/2013 EGD: prepyloric region of stomach and gastric antrum.  . Duodenal ulcer     a. 11/2013 EGD: medium sized, non-bleeding ulcer @ duodenal bulb.  . Duodenitis without hemorrhage     a. 11/2013 EGD: erosive duodenitis w/o bleeding.  . Anemia     a. 11/2013 in setting of ulcer/gastritis.   Past Surgical History  Procedure Laterality Date  . Pilonidal cyst excision  ~ 1995  . Coronary angioplasty with stent placement  01/2013  . Esophagogastroduodenoscopy N/A 11/16/2013    Procedure: ESOPHAGOGASTRODUODENOSCOPY (EGD);  Surgeon: Beverley FiedlerJay M Pyrtle, MD;  [facility];  Service: Endoscopy;  Laterality: N/A;    Allergies  No Known Allergies  HPI  53 y/o male with the above problem list.  He does have a h/o CAD s/p Ant MI and LAD DES x 2 in 01/2013.  Last week, he developed chest pain and presented to the office for evaluation.  He was admitted with plan for cath.  Hgb returned @ 8.0.  FOB was neg.  Cath was cancelled and he was seen by GI.  EGD was performed and revealed gastritis/duodenitis, and a nonbleeding duodenal ulcer.  This was collectively felt to explain his anemia and Ss.  He was placed on PPI therapy and subsequently discharged.  Since d/c, he has had no recurrence of chest pain.  He denies palpitations, dyspnea, pnd, orthopnea, n, v, dizziness, syncope,  edema, weight gain, or early satiety.   Home Medications  Prior to Admission medications   Medication Sig Start Date End Date Taking? Authorizing Provider  acetaminophen (TYLENOL) 325 MG tablet Take 2 tablets (650 mg total) by mouth every 4 (four) hours as needed for headache or mild pain. 11/16/13  Yes Leone BrandLaura R Ingold, NP  aspirin EC 81 MG tablet Take 1 tablet (81 mg total) by mouth daily. 03/31/13  Yes Richarda OverlieNayana Abrol, MD  atorvastatin (LIPITOR) 80 MG tablet Take 1 tablet (80 mg total) by mouth daily at 6 PM. 03/31/13  Yes Richarda OverlieNayana Abrol, MD  carvedilol (COREG) 6.25 MG tablet Take 1 tablet (6.25 mg total) by mouth 2 (two) times daily with a meal. 03/31/13  Yes Richarda OverlieNayana Abrol, MD  docusate sodium 100 MG CAPS Take 100 mg by mouth at bedtime. 11/16/13  Yes Leone BrandLaura R Ingold, NP  lisinopril (PRINIVIL,ZESTRIL) 20 MG tablet Take 1 tablet (20 mg total) by mouth daily. 03/31/13  Yes Richarda OverlieNayana Abrol, MD  nitroGLYCERIN (NITROSTAT) 0.4 MG SL tablet Place 1 tablet (0.4 mg total) under the tongue every 5 (five) minutes as needed for chest pain. 02/12/13  Yes Scott T Alben SpittleWeaver, PA-C  pantoprazole (PROTONIX) 40 MG tablet Take 1 tablet (40 mg total) by mouth 2 (two) times daily. 11/16/13  Yes Darcella GasmanLaura R  Ingold, NP  spironolactone (ALDACTONE) 25 MG tablet TAKE 1/2 TABLET BY MOUTH DAILY. 10/17/13  Yes Jonelle Sidle, MD  Ticagrelor (BRILINTA) 90 MG TABS tablet Take 1 tablet (90 mg total) by mouth 2 (two) times daily. 03/31/13  Yes Richarda Overlie, MD    Review of Systems  Feeling better.  He denies chest pain, palpitations, dyspnea, pnd, orthopnea, n, v, dizziness, syncope, edema, weight gain, or early satiety.  All other systems reviewed and are otherwise negative except as noted above.  Physical Exam  Blood pressure 109/69, pulse 81, height 5\' 7"  (1.702 m), weight 201 lb 9.6 oz (91.445 kg).  General: Pleasant, NAD Psych: Normal affect. Neuro: Alert and oriented X 3. Moves all extremities spontaneously. HEENT: Normal  Neck: Supple  without bruits or JVD. Lungs:  Resp regular and unlabored, CTA. Heart: RRR no s3, s4, or murmurs. Abdomen: Soft, non-tender, non-distended, BS + x 4.  Extremities: No clubbing, cyanosis or edema. DP/PT/Radials 2+ and equal bilaterally.  Accessory Clinical Findings  ECG - RSR, 81, ant infarct, no acute st/t changes.  Assessment & Plan  1.  Midsternal chest pain/gastritis/duodenitis/duodenal ulcer:  Pt was admitted last week with chest pain in the setting of anemia, gastritis, duodenitis, and duodenal ulcer.  He is now on PPI therapy and feeling much better w/o recurrent chest pain.  He has f/u scheduled w/ GI in December.  I will check a CBC today to ensure stability.  2.  CAD: No recurrent c/p.  No obj evidence of ischemia during hospitalization.  Cont asa, statin, bb, acei, brilinta.  As above, f/u CBC today in setting of ongoing asa/brilinta.  3.  ICM:  EF 35-40%.  Euvolemic on exam.  Cont bb/acei/spiro.  He has only been taking coreg QD.  I advised that he begin taking BID and he verbalized understanding.  4.  HTN:  Stable.  5.  HL:  Cont statin.  LDL 38 w/ nl LFT's last week.  Cont statin.  6.  Dispo:  F/U GI as scheduled.  F/U Dr. [place] in 3 mos or sooner if necessary.  Nicolasa Ducking, NP 11/23/2013, 12:48 PM

## 2013-11-24 MED ORDER — POLYSACCHARIDE IRON COMPLEX 150 MG PO CAPS: 150.0000 mg | ORAL_CAPSULE | Freq: Two times a day (BID) | ORAL | Status: DC

## 2013-11-24 NOTE — Telephone Encounter (Signed)
Ok to refill Niferex 150 mg bid  Cincere Zorn [place] MD, [facility]

## 2013-11-24 NOTE — Telephone Encounter (Signed)
Returned call to patient no answer.Left message on personal voice mail Niferex refill sent to pharmacy.

## 2013-11-24 NOTE — Addendum Note (Signed)
Addended by: Meda Klinefelter D on: 11/24/2013 05:41 PM   Modules accepted: Orders

## 2013-12-14 ENCOUNTER — Other Ambulatory Visit: Payer: Self-pay | Admitting: Internal Medicine

## 2013-12-14 MED ORDER — TICAGRELOR 90 MG PO TABS: 90.0000 mg | ORAL_TABLET | Freq: Two times a day (BID) | ORAL | Status: DC

## 2013-12-15 DIAGNOSIS — E785 Hyperlipidemia, unspecified: Secondary | ICD-10-CM

## 2013-12-15 DIAGNOSIS — D500 Iron deficiency anemia secondary to blood loss (chronic): Secondary | ICD-10-CM

## 2013-12-15 DIAGNOSIS — I2510 Atherosclerotic heart disease of native coronary artery without angina pectoris: Secondary | ICD-10-CM

## 2013-12-15 DIAGNOSIS — K269 Duodenal ulcer, unspecified as acute or chronic, without hemorrhage or perforation: Secondary | ICD-10-CM

## 2013-12-15 DIAGNOSIS — K2900 Acute gastritis without bleeding: Secondary | ICD-10-CM

## 2013-12-15 DIAGNOSIS — I255 Ischemic cardiomyopathy: Secondary | ICD-10-CM

## 2013-12-15 DIAGNOSIS — K2980 Duodenitis without bleeding: Secondary | ICD-10-CM

## 2013-12-22 ENCOUNTER — Encounter (HOSPITAL_COMMUNITY): Payer: Self-pay | Admitting: Cardiology

## 2014-01-18 ENCOUNTER — Ambulatory Visit: Payer: Self-pay | Admitting: Internal Medicine

## 2014-01-23 ENCOUNTER — Other Ambulatory Visit: Payer: Self-pay | Admitting: Cardiology

## 2014-01-24 ENCOUNTER — Other Ambulatory Visit: Payer: Self-pay | Admitting: Cardiology

## 2014-01-24 ENCOUNTER — Telehealth: Payer: Self-pay

## 2014-01-25 NOTE — Telephone Encounter (Signed)
Error

## 2014-01-30 ENCOUNTER — Ambulatory Visit: Payer: Self-pay | Admitting: Gastroenterology

## 2014-02-07 ENCOUNTER — Encounter: Payer: Self-pay | Admitting: Gastroenterology

## 2014-02-27 ENCOUNTER — Ambulatory Visit: Payer: Self-pay | Admitting: Cardiology

## 2014-03-17 ENCOUNTER — Ambulatory Visit: Payer: Medicaid Other | Attending: Family Medicine | Admitting: Family Medicine

## 2014-03-17 VITALS — BP 109/68 | HR 60 | Temp 98.7°F | Resp 16 | Ht 67.0 in | Wt 211.0 lb

## 2014-03-17 DIAGNOSIS — T148 Other injury of unspecified body region: Secondary | ICD-10-CM

## 2014-03-17 DIAGNOSIS — Y999 Unspecified external cause status: Secondary | ICD-10-CM | POA: Insufficient documentation

## 2014-03-17 DIAGNOSIS — X58XXXA Exposure to other specified factors, initial encounter: Secondary | ICD-10-CM | POA: Diagnosis not present

## 2014-03-17 DIAGNOSIS — Y939 Activity, unspecified: Secondary | ICD-10-CM | POA: Insufficient documentation

## 2014-03-17 DIAGNOSIS — S301XXA Contusion of abdominal wall, initial encounter: Secondary | ICD-10-CM | POA: Diagnosis not present

## 2014-03-17 DIAGNOSIS — T148XXA Other injury of unspecified body region, initial encounter: Secondary | ICD-10-CM | POA: Insufficient documentation

## 2014-03-17 DIAGNOSIS — Y929 Unspecified place or not applicable: Secondary | ICD-10-CM | POA: Diagnosis not present

## 2014-03-17 DIAGNOSIS — Z862 Personal history of diseases of the blood and blood-forming organs and certain disorders involving the immune mechanism: Secondary | ICD-10-CM | POA: Diagnosis not present

## 2014-03-17 NOTE — Patient Instructions (Signed)
Apply heat to bruise (warm compress or heating pad. Make appointment with Dr.Jegede for rountine health maintenance. We will call you with results of blood work.

## 2014-03-17 NOTE — Assessment & Plan Note (Signed)
There is an approximate 2 inch diameter surface bruise on the left anterior abdomen. Palpation shows a probably marble sized lump in the subcutaneous tissues.   Plan I have explained that this is not anything to be concerned about as far as a acute problem. I have recommended he couple times a day to help this resolve. Due to his anemia history I am doing an anemia panel today.

## 2014-03-17 NOTE — Progress Notes (Signed)
Patient's car door hit him in the stomacj 6 weeks ago causing a bruise.  Patient states bruise went away and then showed uo 3/4 days ago.  Patient states that area is painful "when I push on it" He also states he feels like something is underneath the area.

## 2014-03-18 LAB — ANEMIA PANEL
%SAT: 17 % — AB (ref 20–55)
ABS Retic: 75.1 10*3/uL (ref 19.0–186.0)
FERRITIN: 60 ng/mL (ref 22–322)
Folate: >20 ng/mL
Iron: 61 ug/dL (ref 42–165)
RBC.: 5.78 MIL/uL (ref 4.22–5.81)
Retic Ct Pct: 1.3 % (ref 0.4–2.3)
TIBC: 358 ug/dL (ref 215–435)
UIBC: 297 ug/dL (ref 125–400)
Vitamin B-12: 397 pg/mL (ref 211–911)

## 2014-03-22 ENCOUNTER — Telehealth: Payer: Self-pay | Admitting: *Deleted

## 2014-03-22 NOTE — Telephone Encounter (Signed)
-----   Message from Henrietta Hoover, NP sent at 03/20/2014  9:22 AM EST ----- Labs OK.. No further treatment needed for anemia.

## 2014-03-22 NOTE — Telephone Encounter (Signed)
Left message alerting patient to his normal lab results.

## 2014-03-30 ENCOUNTER — Other Ambulatory Visit: Payer: Self-pay | Admitting: Internal Medicine

## 2014-03-30 ENCOUNTER — Other Ambulatory Visit: Payer: Self-pay | Admitting: Cardiology

## 2014-03-31 ENCOUNTER — Other Ambulatory Visit: Payer: Self-pay | Admitting: *Deleted

## 2014-04-06 ENCOUNTER — Ambulatory Visit (INDEPENDENT_AMBULATORY_CARE_PROVIDER_SITE_OTHER): Payer: Medicaid Other | Admitting: Cardiology

## 2014-04-06 ENCOUNTER — Encounter: Payer: Self-pay | Admitting: Cardiology

## 2014-04-06 VITALS — BP 130/92 | HR 66 | Ht 67.0 in | Wt 208.8 lb

## 2014-04-06 DIAGNOSIS — I2510 Atherosclerotic heart disease of native coronary artery without angina pectoris: Secondary | ICD-10-CM

## 2014-04-06 DIAGNOSIS — E785 Hyperlipidemia, unspecified: Secondary | ICD-10-CM

## 2014-04-06 DIAGNOSIS — I10 Essential (primary) hypertension: Secondary | ICD-10-CM | POA: Diagnosis not present

## 2014-04-06 MED ORDER — CLOPIDOGREL BISULFATE 75 MG PO TABS: 75.0000 mg | ORAL_TABLET | Freq: Every day | ORAL | Status: DC

## 2014-04-06 NOTE — Patient Instructions (Signed)
Stop taking Brilinta.  Start taking Plavix instead - 75 mg daily  Continue your other therapy  I will see you in 6 months.

## 2014-04-06 NOTE — Progress Notes (Signed)
[name] Date of Birth: Apr 12, 1960 Medical Record #[id number]  History of Present Illness: Mr. Mendizabal is seen for follow up CAD. He is s/p anterior STEMI on 02/10/13 treated with DES x 2 of the LAD. ( 3.0x 38 mm Promus and 3.5 x 32 Promus) He has a history of HTN and hyperlipidemia. He is a former smoker. On emergent cardiac cath he had occlusion of the proximal LAD. The RCA was also occluded in the mid vessel but this appeared chronic and was collateralized. EF was 35-40%. In November he was admitted with an UGI bleed. He was found to have a gastritis/duodenitis and duodenal ulcer. He was treated with PPI and Hgb improved from 7.6> 9.7. He did have some angina at that time that resolved with improvement in Hgb. On follow up today he is feeling very well. No chest pain or SOB. No edema or palpitations. He is sedentary.   Current Outpatient Prescriptions on File Prior to Visit  Medication Sig Dispense Refill  . acetaminophen (TYLENOL) 325 MG tablet Take 2 tablets (650 mg total) by mouth every 4 (four) hours as needed for headache or mild pain.    Marland Kitchen aspirin EC 81 MG tablet Take 1 tablet (81 mg total) by mouth daily. 30 tablet 11  . atorvastatin (LIPITOR) 80 MG tablet Take 1 tablet (80 mg total) by mouth daily at 6 PM. 30 tablet 11  . carvedilol (COREG) 6.25 MG tablet TAKE 1 TABLET BY MOUTH 2 TIMES DAILY WITH A MEAL. 60 tablet 3  . docusate sodium 100 MG CAPS Take 100 mg by mouth at bedtime. 10 capsule 0  . iron polysaccharides (NIFEREX) 150 MG capsule Take 1 capsule (150 mg total) by mouth 2 (two) times daily. 60 capsule 3  . lisinopril (PRINIVIL,ZESTRIL) 20 MG tablet Take 1 tablet (20 mg total) by mouth daily. 90 tablet 3  . nitroGLYCERIN (NITROSTAT) 0.4 MG SL tablet Place 1 tablet (0.4 mg total) under the tongue every 5 (five) minutes as needed for chest pain. 25 tablet 12  . pantoprazole (PROTONIX) 40 MG tablet TAKE ONE TABLET BY MOUTH TWICE DAILY 60 tablet 0  . spironolactone (ALDACTONE)  25 MG tablet TAKE 1/2 TABLET BY MOUTH DAILY. 15 tablet 5   No current facility-administered medications on file prior to visit.    No Known Allergies  Past Medical History  Diagnosis Date  . Hypertension   . CAD (coronary artery disease)     a. Ant STEMI (01/2013):  LHC - dLM 20, pLAD 100 (DES x 2), CFX < 20, mRCA 100% (L-R collats)  . Ischemic cardiomyopathy     a. echo (01/2013):  mild LVH, EF 35-40%, AS and apical AK, PASP 34  . HLD (hyperlipidemia)   . Erosive gastritis     a. 11/2013 EGD: prepyloric region of stomach and gastric antrum.  . Duodenal ulcer     a. 11/2013 EGD: medium sized, non-bleeding ulcer @ duodenal bulb.  . Duodenitis without hemorrhage     a. 11/2013 EGD: erosive duodenitis w/o bleeding.  . Anemia     a. 11/2013 in setting of ulcer/gastritis.    Past Surgical History  Procedure Laterality Date  . Pilonidal cyst excision  ~ 1995  . Coronary angioplasty with stent placement  01/2013  . Esophagogastroduodenoscopy N/A 11/16/2013    Procedure: ESOPHAGOGASTRODUODENOSCOPY (EGD);  Surgeon: Beverley Fiedler, MD;  Location: [facility] ENDOSCOPY;  Service: Endoscopy;  Laterality: N/A;  . Coronary angiogram  02/10/2013    Procedure: CORONARY  Rosalin Hawking;  Surgeon: Mariacristina Aday M Swaziland, MD;  Location: [facility] CATH LAB;  Service: Cardiovascular;;  . Percutaneous coronary stent intervention (pci-s)  02/10/2013    Procedure: PERCUTANEOUS CORONARY STENT INTERVENTION (PCI-S);  Surgeon: Aashrith Eves M [place]and, MD;  Location: [facility] CATH LAB;  Service: Cardiovascular;;  DES x2 Prox LAD to MID LAD    History  Smoking status  . Former Smoker -- 0.50 packs/day for 34 years  . Types: Cigarettes  . Quit date: 04/13/2012  Smokeless tobacco  . Never Used    History  Alcohol Use No    History reviewed. No pertinent family history.  Review of Systems: As noted in HPI.  All other systems were reviewed and are negative.  Physical Exam: BP 130/92 mmHg  Pulse 66  Ht  (1.702 m)  Wt 208 lb 12.8 oz (94.711  kg)  BMI 32.69 kg/m2 [name] male in NAD HEENT: [place]/AT, PERRL, EOMI, sclera are clear. Oropharynx is clear.  Neck: no JVD, bruits, adenopathy Lungs: clear CV: RRR, normal S1-2, No murmur or gallop Abdomen: soft, NT, BS+, no masses or bruits. Ext: no edema. Radial cath site without hematoma. Pulses 2+ Neuro: alert, oriented x 3. Nonfocal.  LABORATORY DATA: Lab Results  Component Value Date   WBC 8.6 11/23/2013   HGB 9.7* 11/23/2013   HCT 30.2* 11/23/2013   PLT 322.0 11/23/2013   GLUCOSE 98 11/15/2013   CHOL 97 11/15/2013   TRIG 185* 11/15/2013   HDL 22* 11/15/2013   LDLCALC 38 11/15/2013   ALT 18 11/14/2013   AST 19 11/14/2013   NA 139 11/15/2013   K 4.5 11/15/2013   CL 106 11/15/2013   CREATININE 0.96 11/15/2013   BUN 17 11/15/2013   CO2 22 11/15/2013   TSH 0.756 11/14/2013   INR 1.26 11/14/2013   HGBA1C 6.1* 02/10/2013     Assessment / Plan: Given extensive stenting in the LAD I would prefer long term DAPT. For cost we will switch Bilinta to Plavix 75 mg daily. Continue ASA 81 mg daily. Will treat chronic total occlusion of RCA medically.  He is asymptomatic now. Recommend increased aerobic activity. Try and lose weight.  I will follow up in 6 months.  2. Ischemic cardiomyopathy. EF 35-40%. Continue Coreg, lisinopril, aldactone.   3. HTN controlled.  4. Hyperlipidemia on high dose statin. Will request results of lab work done at [facility] clinic.   5. History of gastritis/duondenitis and duodenal ulcer. If he has recurrent GIB may need to rethink DAPT.

## 2014-04-13 ENCOUNTER — Other Ambulatory Visit: Payer: Self-pay | Admitting: Cardiology

## 2014-04-13 NOTE — Telephone Encounter (Signed)
Rx refill sent to patient pharmacy   

## 2014-05-23 ENCOUNTER — Other Ambulatory Visit: Payer: Self-pay | Admitting: Cardiology

## 2014-05-25 NOTE — Telephone Encounter (Signed)
Note 04/06/14

## 2014-06-20 ENCOUNTER — Other Ambulatory Visit: Payer: Self-pay

## 2014-06-20 ENCOUNTER — Telehealth: Payer: Self-pay | Admitting: Internal Medicine

## 2014-06-20 MED ORDER — ATORVASTATIN CALCIUM 80 MG PO TABS: 80.0000 mg | ORAL_TABLET | Freq: Every day | ORAL | Status: DC

## 2014-06-20 NOTE — Telephone Encounter (Signed)
Patient came into facility to request a med refill atorvastatin (LIPITOR) 80 MG tablet. Pt. Needs enough medication to last him until his next appointment. Please f/u

## 2014-07-03 ENCOUNTER — Telehealth: Payer: Self-pay | Admitting: Cardiology

## 2014-07-03 NOTE — Telephone Encounter (Signed)
Pt. Called no answer LMTCB 

## 2014-07-10 ENCOUNTER — Other Ambulatory Visit: Payer: Self-pay | Admitting: Cardiology

## 2014-07-13 ENCOUNTER — Encounter: Payer: Self-pay | Admitting: Internal Medicine

## 2014-07-13 ENCOUNTER — Ambulatory Visit: Payer: Medicaid Other | Attending: Internal Medicine | Admitting: Internal Medicine

## 2014-07-13 VITALS — BP 137/88 | HR 71 | Temp 98.5°F | Resp 16 | Wt 202.8 lb

## 2014-07-13 DIAGNOSIS — Z87891 Personal history of nicotine dependence: Secondary | ICD-10-CM | POA: Insufficient documentation

## 2014-07-13 DIAGNOSIS — I10 Essential (primary) hypertension: Secondary | ICD-10-CM | POA: Insufficient documentation

## 2014-07-13 DIAGNOSIS — E785 Hyperlipidemia, unspecified: Secondary | ICD-10-CM

## 2014-07-13 LAB — CBC WITH DIFFERENTIAL/PLATELET
Basophils Absolute: 0 10*3/uL (ref 0.0–0.1)
Basophils Relative: 0 % (ref 0–1)
EOS PCT: 3 % (ref 0–5)
Eosinophils Absolute: 0.3 10*3/uL (ref 0.0–0.7)
HEMATOCRIT: 42.3 % (ref 39.0–52.0)
HEMOGLOBIN: 14.1 g/dL (ref 13.0–17.0)
LYMPHS ABS: 3.1 10*3/uL (ref 0.7–4.0)
Lymphocytes Relative: 36 % (ref 12–46)
MCH: 26.9 pg (ref 26.0–34.0)
MCHC: 33.3 g/dL (ref 30.0–36.0)
MCV: 80.6 fL (ref 78.0–100.0)
MPV: 12.5 fL — ABNORMAL HIGH (ref 8.6–12.4)
Monocytes Absolute: 0.7 10*3/uL (ref 0.1–1.0)
Monocytes Relative: 8 % (ref 3–12)
NEUTROS ABS: 4.6 10*3/uL (ref 1.7–7.7)
Neutrophils Relative %: 53 % (ref 43–77)
PLATELETS: 214 10*3/uL (ref 150–400)
RBC: 5.25 MIL/uL (ref 4.22–5.81)
RDW: 14.8 % (ref 11.5–15.5)
WBC: 8.7 10*3/uL (ref 4.0–10.5)

## 2014-07-13 LAB — COMPLETE METABOLIC PANEL WITH GFR
ALT: 22 U/L (ref 0–53)
AST: 24 U/L (ref 0–37)
Albumin: 4.1 g/dL (ref 3.5–5.2)
Alkaline Phosphatase: 77 U/L (ref 39–117)
BUN: 16 mg/dL (ref 6–23)
CALCIUM: 8.9 mg/dL (ref 8.4–10.5)
CO2: 25 mEq/L (ref 19–32)
Chloride: 102 mEq/L (ref 96–112)
Creat: 0.99 mg/dL (ref 0.50–1.35)
GFR, Est African American: >89 mL/min
GFR, Est Non African American: 85 mL/min
Glucose, Bld: 110 mg/dL — ABNORMAL HIGH (ref 70–99)
POTASSIUM: 5.2 meq/L (ref 3.5–5.3)
Sodium: 140 mEq/L (ref 135–145)
TOTAL PROTEIN: 7.5 g/dL (ref 6.0–8.3)
Total Bilirubin: 0.6 mg/dL (ref 0.2–1.2)

## 2014-07-13 LAB — LIPID PANEL
Cholesterol: 142 mg/dL (ref 0–200)
HDL: 29 mg/dL — ABNORMAL LOW (ref >=40)
LDL Cholesterol: 86 mg/dL (ref 0–99)
Total CHOL/HDL Ratio: 4.9 Ratio
Triglycerides: 133 mg/dL (ref <150)
VLDL: 27 mg/dL (ref 0–40)

## 2014-07-13 LAB — TSH: TSH: 0.847 u[IU]/mL (ref 0.350–4.500)

## 2014-07-13 LAB — POCT GLYCOSYLATED HEMOGLOBIN (HGB A1C): HEMOGLOBIN A1C: 5.6

## 2014-07-13 NOTE — Progress Notes (Signed)
Patient ID: Jason Foster, male   DOB: 07-26-1960, 54 y.o.   MRN: [id number]   Jason Foster, is a 54 y.o. male  APO:[id number]  YHO:[id number]  DOB - February 16, 1960  Chief Complaint  Patient presents with  . Follow-up        Subjective:   Jason Foster is a 54 y.o. male here today for a follow up visit. Patient has history of hypertension, hyperlipidemia, former smoker here today for routine follow-up and for blood tests. He has not been seen here for over a year. He is s/p anterior STEMI on 02/10/13 treated with DES x 2 of the LAD. ( 3.0x 38 mm Promus and 3.5 x 32 Promus). On emergent cardiac cath he had occlusion of the proximal LAD. The RCA was also occluded in the mid vessel but this appeared chronic and was collateralized. EF was 35-40%. In November he was admitted with an UGI bleed. He was found to have a gastritis/duodenitis and duodenal ulcer. He was treated with PPI and Hgb improved from 7.6> 9.7. He did have some angina at that time that resolved with improvement in Hgb. On follow up today he is feeling very well. No chest pain or SOB. No edema or palpitations. He is sedentary. He remarried and claims is very happy today. He believes his heart attack was due to stress from previous marriage. And now he has changed his lifestyle, no more smoking, he engages in regular physical exercise. He has no complaint today. He does not need refill on his medications because he picked them up yesterday.  Patient has No headache, No chest pain, No abdominal pain - No Nausea, No new weakness tingling or numbness, No Cough - SOB.  No problems updated.  ALLERGIES: No Known Allergies  PAST MEDICAL HISTORY: Past Medical History  Diagnosis Date  . Hypertension   . CAD (coronary artery disease)     a. Ant STEMI (01/2013):  LHC - dLM 20, pLAD 100 (DES x 2), CFX < 20, mRCA 100% (L-R collats)  . Ischemic cardiomyopathy     a. echo (01/2013):  mild LVH, EF 35-40%, AS and apical AK, PASP 34  . HLD  (hyperlipidemia)   . Erosive gastritis     a. 11/2013 EGD: prepyloric region of stomach and gastric antrum.  . Duodenal ulcer     a. 11/2013 EGD: medium sized, non-bleeding ulcer @ duodenal bulb.  . Duodenitis without hemorrhage     a. 11/2013 EGD: erosive duodenitis w/o bleeding.  . Anemia     a. 11/2013 in setting of ulcer/gastritis.    MEDICATIONS AT HOME: Prior to Admission medications   Medication Sig Start Date End Date Taking? Authorizing Provider  acetaminophen (TYLENOL) 325 MG tablet Take 2 tablets (650 mg total) by mouth every 4 (four) hours as needed for headache or mild pain. 11/16/13   Leone Brand, NP  aspirin EC 81 MG tablet Take 1 tablet (81 mg total) by mouth daily. 03/31/13   Richarda Overlie, MD  atorvastatin (LIPITOR) 80 MG tablet Take 1 tablet (80 mg total) by mouth daily at 6 PM. 06/20/14   Quentin Angst, MD  carvedilol (COREG) 6.25 MG tablet TAKE 1 TABLET BY MOUTH 2 TIMES DAILY WITH A MEAL. 03/31/14   Peter M [place], MD  clopidogrel (PLAVIX) 75 MG tablet Take 1 tablet (75 mg total) by mouth daily. 04/06/14   Peter M [place], MD  docusate sodium 100 MG CAPS Take 100 mg by mouth at bedtime. 11/16/13  Leone Brand, NP  iron polysaccharides (NIFEREX) 150 MG capsule Take 1 capsule (150 mg total) by mouth 2 (two) times daily. 11/24/13   Peter M [place], MD  lisinopril (PRINIVIL,ZESTRIL) 20 MG tablet Take 1 tablet (20 mg total) by mouth daily. 03/31/13   Richarda Overlie, MD  nitroGLYCERIN (NITROSTAT) 0.4 MG SL tablet Place 1 tablet (0.4 mg total) under the tongue every 5 (five) minutes as needed for chest pain. 02/12/13   Beatrice Lecher, PA-C  pantoprazole (PROTONIX) 40 MG tablet TAKE ONE TABLET BY MOUTH TWICE DAILY 05/25/14   Peter M [place], MD  spironolactone (ALDACTONE) 25 MG tablet TAKE 1/2 TABLET BY MOUTH DAILY. 11/24/13   Peter M [place], MD     Objective:   Filed Vitals:   07/13/14 1009  BP: 137/88  Pulse: 71  Temp: 98.5 F (36.9 C)  Resp: 16  Weight: 202 lb 12.8 oz  (91.989 kg)  SpO2: 100%    Exam General appearance : Awake, alert, not in any distress. Speech Clear. Not toxic looking HEENT: Atraumatic and Normocephalic, pupils equally reactive to light and accomodation Neck: supple, no JVD. No cervical lymphadenopathy.  Chest:Good air entry bilaterally, no added sounds  CVS: S1 S2 regular, no murmurs.  Abdomen: Bowel sounds present, Non tender and not distended with no gaurding, rigidity or rebound. Extremities: B/L Lower Ext shows no edema, both legs are warm to touch Neurology: Awake alert, and oriented X 3, CN II-XII intact, Non focal Skin:No Rash  Data Review Lab Results  Component Value Date   HGBA1C 6.1* 02/10/2013     Assessment & Plan   1. Essential hypertension, benign  - CBC with Differential/Platelet - COMPLETE METABOLIC PANEL WITH GFR - POCT glycosylated hemoglobin (Hb A1C) - TSH - Urinalysis, Complete  - We have discussed target BP range and blood pressure goal - I have advised patient to check BP regularly and to call us back or report to clinic if the numbers are consistently higher than 140/90  - We discussed the importance of compliance with medical therapy and DASH diet recommended, consequences of uncontrolled hypertension discussed.  - continue current BP medications  2. Dyslipidemia  - Lipid panel To address this please limit saturated fat to no more than 7% of your calories, limit cholesterol to 200 mg/day, increase fiber and exercise as tolerated. If needed we may add another cholesterol lowering medication to your regimen.   Patient have been counseled extensively about nutrition and exercise Return in about 3 months (around 10/13/2014) for Follow up HTN, Heart Failure and Hypertension, Follow up Pain and comorbidities, Annual Physical.  The patient was given clear instructions to go to ER or return to medical center if symptoms don't improve, worsen or new problems develop. The patient verbalized  understanding. The patient was told to call to get lab results if they haven't heard anything in the next week.   This note has been created with Education officer, environmental. Any transcriptional errors are unintentional.    Jeanann Lewandowsky, MD, MHA, CPE, FACP, FAAP [facility] [place], [place] [phone]   07/13/2014, 11:05 AM

## 2014-07-13 NOTE — Patient Instructions (Signed)
DASH Eating Plan DASH stands for "Dietary Approaches to Stop Hypertension." The DASH eating plan is a healthy eating plan that has been shown to reduce high blood pressure (hypertension). Additional health benefits may include reducing the risk of type 2 diabetes mellitus, heart disease, and stroke. The DASH eating plan may also help with weight loss. WHAT DO I NEED TO KNOW ABOUT THE DASH EATING PLAN? For the DASH eating plan, you will follow these general guidelines:  Choose foods with a percent daily value for sodium of less than 5% (as listed on the food label).  Use salt-free seasonings or herbs instead of table salt or sea salt.  Check with your health care provider or pharmacist before using salt substitutes.  Eat lower-sodium products, often labeled as "lower sodium" or "no salt added."  Eat fresh foods.  Eat more vegetables, fruits, and low-fat dairy products.  Choose whole grains. Look for the word "whole" as the first word in the ingredient list.  Choose fish and skinless chicken or turkey more often than red meat. Limit fish, poultry, and meat to 6 oz (170 g) each day.  Limit sweets, desserts, sugars, and sugary drinks.  Choose heart-healthy fats.  Limit cheese to 1 oz (28 g) per day.  Eat more home-cooked food and less restaurant, buffet, and fast food.  Limit fried foods.  Cook foods using methods other than frying.  Limit canned vegetables. If you do use them, rinse them well to decrease the sodium.  When eating at a restaurant, ask that your food be prepared with less salt, or no salt if possible. WHAT FOODS CAN I EAT? Seek help from a dietitian for individual calorie needs. Grains Whole grain or whole wheat bread. Brown rice. Whole grain or whole wheat pasta. Quinoa, bulgur, and whole grain cereals. Low-sodium cereals. Corn or whole wheat flour tortillas. Whole grain cornbread. Whole grain crackers. Low-sodium crackers. Vegetables Fresh or frozen vegetables  (raw, steamed, roasted, or grilled). Low-sodium or reduced-sodium tomato and vegetable juices. Low-sodium or reduced-sodium tomato sauce and paste. Low-sodium or reduced-sodium canned vegetables.  Fruits All fresh, canned (in natural juice), or frozen fruits. Meat and Other Protein Products Ground beef (85% or leaner), grass-fed beef, or beef trimmed of fat. Skinless chicken or turkey. Ground chicken or turkey. Pork trimmed of fat. All fish and seafood. Eggs. Dried beans, peas, or lentils. Unsalted nuts and seeds. Unsalted canned beans. Dairy Low-fat dairy products, such as skim or 1% milk, 2% or reduced-fat cheeses, low-fat ricotta or cottage cheese, or plain low-fat yogurt. Low-sodium or reduced-sodium cheeses. Fats and Oils Tub margarines without trans fats. Light or reduced-fat mayonnaise and salad dressings (reduced sodium). Avocado. Safflower, olive, or canola oils. Natural peanut or almond butter. Other Unsalted popcorn and pretzels. The items listed above may not be a complete list of recommended foods or beverages. Contact your dietitian for more options. WHAT FOODS ARE NOT RECOMMENDED? Grains White bread. White pasta. White rice. Refined cornbread. Bagels and croissants. Crackers that contain trans fat. Vegetables Creamed or fried vegetables. Vegetables in a cheese sauce. Regular canned vegetables. Regular canned tomato sauce and paste. Regular tomato and vegetable juices. Fruits Dried fruits. Canned fruit in light or heavy syrup. Fruit juice. Meat and Other Protein Products Fatty cuts of meat. Ribs, chicken wings, bacon, sausage, bologna, salami, chitterlings, fatback, hot dogs, bratwurst, and packaged luncheon meats. Salted nuts and seeds. Canned beans with salt. Dairy Whole or 2% milk, cream, half-and-half, and cream cheese. Whole-fat or sweetened yogurt. Full-fat   cheeses or blue cheese. Nondairy creamers and whipped toppings. Processed cheese, cheese spreads, or cheese  curds. Condiments Onion and garlic salt, seasoned salt, table salt, and sea salt. Canned and packaged gravies. Worcestershire sauce. Tartar sauce. Barbecue sauce. Teriyaki sauce. Soy sauce, including reduced sodium. Steak sauce. Fish sauce. Oyster sauce. Cocktail sauce. Horseradish. Ketchup and mustard. Meat flavorings and tenderizers. Bouillon cubes. Hot sauce. Tabasco sauce. Marinades. Taco seasonings. Relishes. Fats and Oils Butter, stick margarine, lard, shortening, ghee, and bacon fat. Coconut, palm kernel, or palm oils. Regular salad dressings. Other Pickles and olives. Salted popcorn and pretzels. The items listed above may not be a complete list of foods and beverages to avoid. Contact your dietitian for more information. WHERE CAN I FIND MORE INFORMATION? National Heart, Lung, and Blood Institute: www.nhlbi.nih.gov/health/health-topics/topics/dash/ Document Released: 12/19/2010 Document Revised: 05/16/2013 Document Reviewed: 11/03/2012 ExitCare Patient Information 2015 [employer]. This information is not intended to replace advice given to you by your health care provider. Make sure you discuss any questions you have with your health care provider. Hypertension Hypertension, commonly called high blood pressure, is when the force of blood pumping through your arteries is too strong. Your arteries are the blood vessels that carry blood from your heart throughout your body. A blood pressure reading consists of a higher number over a lower number, such as 110/72. The higher number (systolic) is the pressure inside your arteries when your heart pumps. The lower number (diastolic) is the pressure inside your arteries when your heart relaxes. Ideally you want your blood pressure below 120/80. Hypertension forces your heart to work harder to pump blood. Your arteries may become narrow or stiff. Having hypertension puts you at risk for heart disease, stroke, and other problems.  RISK  FACTORS Some risk factors for high blood pressure are controllable. Others are not.  Risk factors you cannot control include:   Race. You may be at higher risk if you are African American.  Age. Risk increases with age.  Gender. Men are at higher risk than women before age 45 years. After age 65, women are at higher risk than men. Risk factors you can control include:  Not getting enough exercise or physical activity.  Being overweight.  Getting too much fat, sugar, calories, or salt in your diet.  Drinking too much alcohol. SIGNS AND SYMPTOMS Hypertension does not usually cause signs or symptoms. Extremely high blood pressure (hypertensive crisis) may cause headache, anxiety, shortness of breath, and nosebleed. DIAGNOSIS  To check if you have hypertension, your health care provider will measure your blood pressure while you are seated, with your arm held at the level of your heart. It should be measured at least twice using the same arm. Certain conditions can cause a difference in blood pressure between your right and left arms. A blood pressure reading that is higher than normal on one occasion does not mean that you need treatment. If one blood pressure reading is high, ask your health care provider about having it checked again. TREATMENT  Treating high blood pressure includes making lifestyle changes and possibly taking medicine. Living a healthy lifestyle can help lower high blood pressure. You may need to change some of your habits. Lifestyle changes may include:  Following the DASH diet. This diet is high in fruits, vegetables, and whole grains. It is low in salt, red meat, and added sugars.  Getting at least 2 hours of brisk physical activity every week.  Losing weight if necessary.  Not smoking.  Limiting   alcoholic beverages.  Learning ways to reduce stress. If lifestyle changes are not enough to get your blood pressure under control, your health care provider may  prescribe medicine. You may need to take more than one. Work closely with your health care provider to understand the risks and benefits. HOME CARE INSTRUCTIONS  Have your blood pressure rechecked as directed by your health care provider.   Take medicines only as directed by your health care provider. Follow the directions carefully. Blood pressure medicines must be taken as prescribed. The medicine does not work as well when you skip doses. Skipping doses also puts you at risk for problems.   Do not smoke.   Monitor your blood pressure at home as directed by your health care provider. SEEK MEDICAL CARE IF:   You think you are having a reaction to medicines taken.  You have recurrent headaches or feel dizzy.  You have swelling in your ankles.  You have trouble with your vision. SEEK IMMEDIATE MEDICAL CARE IF:  You develop a severe headache or confusion.  You have unusual weakness, numbness, or feel faint.  You have severe chest or abdominal pain.  You vomit repeatedly.  You have trouble breathing. MAKE SURE YOU:   Understand these instructions.  Will watch your condition.  Will get help right away if you are not doing well or get worse. Document Released: 12/30/2004 Document Revised: 05/16/2013 Document Reviewed: 10/22/2012 ExitCare Patient Information 2015 [employer]. This information is not intended to replace advice given to you by your health care provider. Make sure you discuss any questions you have with your health care provider.  

## 2014-07-13 NOTE — Progress Notes (Signed)
Patient here for routine follow up on his hypertension and CAD Patient does not need refills at this time Patient has no complaints at today's visit

## 2014-07-14 ENCOUNTER — Telehealth: Payer: Self-pay

## 2014-07-14 LAB — URINALYSIS, COMPLETE
BACTERIA UA: NONE SEEN
Bilirubin Urine: NEGATIVE
CRYSTALS: NONE SEEN
Casts: NONE SEEN
GLUCOSE, UA: NEGATIVE mg/dL
HGB URINE DIPSTICK: NEGATIVE
Ketones, ur: NEGATIVE mg/dL
Leukocytes, UA: NEGATIVE
NITRITE: NEGATIVE
PH: 5.5 (ref 5.0–8.0)
PROTEIN: NEGATIVE mg/dL
Specific Gravity, Urine: 1.021 (ref 1.005–1.030)
Squamous Epithelial / LPF: NONE SEEN
Urobilinogen, UA: 0.2 mg/dL (ref 0.0–1.0)

## 2014-07-14 NOTE — Telephone Encounter (Signed)
Nurse called patient, reached voicemail. Left message for patient to call Tema Alire with [facility], at [phone].  

## 2014-07-14 NOTE — Telephone Encounter (Signed)
-----   Message from Olugbemiga E Jegede, MD sent at 07/14/2014 12:26 PM EDT ----- Please inform patient that his laboratory test results are all within normal limits. 

## 2014-07-14 NOTE — Telephone Encounter (Signed)
Nurse called patient, patient verified date of birth. Patient aware of normal lab results.  Patient voices understanding and has no further questions at this time.

## 2014-07-14 NOTE — Telephone Encounter (Signed)
-----   Message from Quentin Angst, MD sent at 07/14/2014 12:26 PM EDT ----- Please inform patient that his laboratory test results are all within normal limits.

## 2014-07-24 ENCOUNTER — Other Ambulatory Visit: Payer: Self-pay

## 2014-07-24 ENCOUNTER — Other Ambulatory Visit: Payer: Self-pay | Admitting: Cardiology

## 2014-07-24 MED ORDER — SPIRONOLACTONE 25 MG PO TABS: 12.5000 mg | ORAL_TABLET | Freq: Every day | ORAL | Status: DC

## 2014-10-13 ENCOUNTER — Ambulatory Visit (INDEPENDENT_AMBULATORY_CARE_PROVIDER_SITE_OTHER): Payer: Medicaid Other | Admitting: Cardiology

## 2014-10-13 ENCOUNTER — Encounter: Payer: Self-pay | Admitting: Cardiology

## 2014-10-13 VITALS — BP 128/88 | HR 78 | Ht 67.0 in | Wt 210.8 lb

## 2014-10-13 DIAGNOSIS — I2510 Atherosclerotic heart disease of native coronary artery without angina pectoris: Secondary | ICD-10-CM

## 2014-10-13 DIAGNOSIS — I255 Ischemic cardiomyopathy: Secondary | ICD-10-CM | POA: Diagnosis not present

## 2014-10-13 DIAGNOSIS — I10 Essential (primary) hypertension: Secondary | ICD-10-CM

## 2014-10-13 DIAGNOSIS — I5022 Chronic systolic (congestive) heart failure: Secondary | ICD-10-CM

## 2014-10-13 HISTORY — DX: Chronic systolic (congestive) heart failure: I50.22

## 2014-10-13 NOTE — Patient Instructions (Signed)
Continue your current therapy  Get more exercise.  I will see you in 6-8 months.

## 2014-10-13 NOTE — Progress Notes (Signed)
[name] Date of Birth: 1960-08-05 Medical Record #[id number]  History of Present Illness: Mr. Decaprio is seen for follow up CAD. He is s/p anterior STEMI on 02/10/13 treated with DES x 2 of the LAD. ( 3.0x 38 mm Promus and 3.5 x 32 Promus) He has a history of HTN and hyperlipidemia. He is a former smoker. On emergent cardiac cath he had occlusion of the proximal LAD. The RCA was also occluded in the mid vessel but this appeared chronic and was collateralized. EF was 35-40%. In November 2015 he was admitted with an UGI bleed. He was found to have a gastritis/duodenitis and duodenal ulcer. He was treated with PPI and Hgb later normalized. On follow up today he is feeling very well. No chest pain or SOB. No edema or palpitations. 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   Current Outpatient Prescriptions on File Prior to Visit  Medication Sig Dispense Refill  . aspirin EC 81 MG tablet Take 1 tablet (81 mg total) by mouth daily. (Patient taking differently: Take 325 mg by mouth daily. ) 30 tablet 11  . atorvastatin (LIPITOR) 80 MG tablet Take 1 tablet (80 mg total) by mouth daily at 6 PM. 30 tablet 2  . carvedilol (COREG) 6.25 MG tablet TAKE 1 TABLET BY MOUTH 2 TIMES DAILY WITH A MEAL. 60 tablet 3  . clopidogrel (PLAVIX) 75 MG tablet Take 1 tablet (75 mg total) by mouth daily. 90 tablet 3  . iron polysaccharides (NIFEREX) 150 MG capsule Take 1 capsule (150 mg total) by mouth 2 (two) times daily. 60 capsule 3  . lisinopril (PRINIVIL,ZESTRIL) 20 MG tablet Take 1 tablet (20 mg total) by mouth daily. 90 tablet 3  . nitroGLYCERIN (NITROSTAT) 0.4 MG SL tablet Place 1 tablet (0.4 mg total) under the tongue every 5 (five) minutes as needed for chest pain. 25 tablet 12  . pantoprazole (PROTONIX) 40 MG tablet TAKE ONE TABLET BY MOUTH TWICE DAILY 60 tablet 6  . spironolactone (ALDACTONE) 25 MG tablet Take 0.5 tablets (12.5 mg total) by mouth daily. 15 tablet 5   No current facility-administered  medications on file prior to visit.    No Known Allergies  Past Medical History  Diagnosis Date  . Hypertension   . CAD (coronary artery disease)     a. Ant STEMI (01/2013):  LHC - dLM 20, pLAD 100 (DES x 2), CFX < 20, mRCA 100% (L-R collats)  . Ischemic cardiomyopathy     a. echo (01/2013):  mild LVH, EF 35-40%, AS and apical AK, PASP 34  . HLD (hyperlipidemia)   . Erosive gastritis     a. 11/2013 EGD: prepyloric region of stomach and gastric antrum.  . Duodenal ulcer     a. 11/2013 EGD: medium sized, non-bleeding ulcer @ duodenal bulb.  . Duodenitis without hemorrhage     a. 11/2013 EGD: erosive duodenitis w/o bleeding.  . Anemia     a. 11/2013 in setting of ulcer/gastritis.  . Chronic systolic CHF (congestive heart failure) 10/13/2014    Past Surgical History  Procedure Laterality Date  . Pilonidal cyst excision  ~ 1995  . Coronary angioplasty with stent placement  01/2013  . Esophagogastroduodenoscopy N/A 11/16/2013    Procedure: ESOPHAGOGASTRODUODENOSCOPY (EGD);  Surgeon: Beverley Fiedler, MD;  Location: [facility] ENDOSCOPY;  Service: Endoscopy;  Laterality: N/A;  . Coronary angiogram  02/10/2013    Procedure: CORONARY ANGIOGRAM;  Surgeon: Peter M Swaziland, MD;  Location: [facility] CATH LAB;  Service: Cardiovascular;;  . Percutaneous coronary stent intervention (pci-s)  02/10/2013    Procedure: PERCUTANEOUS CORONARY STENT INTERVENTION (PCI-S);  Surgeon: Peter M Swaziland, MD;  Location: [facility] CATH LAB;  Service: Cardiovascular;;  DES x2 Prox LAD to MID LAD    History  Smoking status  . Former Smoker -- 0.50 packs/day for 34 years  . Types: Cigarettes  . Quit date: 04/13/2012  Smokeless tobacco  . Never Used    History  Alcohol Use No    History reviewed. No pertinent family history.  Review of Systems: As noted in HPI.  All other systems were reviewed and are negative.  Physical Exam: BP 128/88 mmHg  Pulse 78  Ht  (1.702 m)  Wt 95.618 kg (210 lb 12.8 oz)  BMI 33.01 kg/m2 [place]  [place] male in NAD HEENT: [place]/AT, PERRL, EOMI, sclera are clear. Oropharynx is clear.  Neck: no JVD, bruits, adenopathy Lungs: clear CV: RRR, normal S1-2, No murmur or gallop Abdomen: soft, NT, BS+, no masses or bruits. Ext: no edema. Radial cath site without hematoma. Pulses 2+ Neuro: alert, oriented x 3. Nonfocal.  LABORATORY DATA: Lab Results  Component Value Date   WBC 8.7 07/13/2014   HGB 14.1 07/13/2014   HCT 42.3 07/13/2014   PLT 214 07/13/2014   GLUCOSE 110* 07/13/2014   CHOL 142 07/13/2014   TRIG 133 07/13/2014   HDL 29* 07/13/2014   LDLCALC 86 07/13/2014   ALT 22 07/13/2014   AST 24 07/13/2014   NA 140 07/13/2014   K 5.2 07/13/2014   CL 102 07/13/2014   CREATININE 0.99 07/13/2014   BUN 16 07/13/2014   CO2 25 07/13/2014   TSH 0.847 07/13/2014   INR 1.26 11/14/2013   HGBA1C 5.60 07/13/2014     Assessment / Plan:   1. CAD s/p anterior MI with stenting of the LAD with DES x 2 in January 2015. Given extensive stenting in the LAD I would prefer long term DAPT.  Will treat chronic total occlusion of RCA medically.  He is asymptomatic now. Recommend increased aerobic activity. Try and lose weight.  I will follow up in 6-8 months.  2. Ischemic cardiomyopathy. EF 35-40%. Continue Coreg, lisinopril, aldactone.   3. HTN controlled.  4. Hyperlipidemia on high dose statin. Controlled.   5. History of gastritis/duondenitis and duodenal ulcer. If he has recurrent GIB may need to rethink DAPT.

## 2014-10-31 ENCOUNTER — Other Ambulatory Visit: Payer: Self-pay | Admitting: Cardiology

## 2014-10-31 NOTE — Telephone Encounter (Signed)
Rx(s) sent to pharmacy electronically.  

## 2014-12-01 ENCOUNTER — Other Ambulatory Visit: Payer: Self-pay | Admitting: Internal Medicine

## 2014-12-03 ENCOUNTER — Encounter (HOSPITAL_COMMUNITY): Payer: Self-pay | Admitting: Emergency Medicine

## 2014-12-03 ENCOUNTER — Emergency Department (HOSPITAL_COMMUNITY): Payer: Medicaid Other

## 2014-12-03 ENCOUNTER — Emergency Department (HOSPITAL_COMMUNITY)
Admission: EM | Admit: 2014-12-03 | Discharge: 2014-12-03 | Disposition: A | Discharge status: Home / Self Care | Payer: Medicaid Other | Attending: Emergency Medicine | Admitting: Emergency Medicine

## 2014-12-03 DIAGNOSIS — Z7902 Long term (current) use of antithrombotics/antiplatelets: Secondary | ICD-10-CM | POA: Diagnosis not present

## 2014-12-03 DIAGNOSIS — Z87891 Personal history of nicotine dependence: Secondary | ICD-10-CM | POA: Diagnosis not present

## 2014-12-03 DIAGNOSIS — Z862 Personal history of diseases of the blood and blood-forming organs and certain disorders involving the immune mechanism: Secondary | ICD-10-CM | POA: Insufficient documentation

## 2014-12-03 DIAGNOSIS — Z8719 Personal history of other diseases of the digestive system: Secondary | ICD-10-CM | POA: Diagnosis not present

## 2014-12-03 DIAGNOSIS — Z7982 Long term (current) use of aspirin: Secondary | ICD-10-CM | POA: Insufficient documentation

## 2014-12-03 DIAGNOSIS — E785 Hyperlipidemia, unspecified: Secondary | ICD-10-CM | POA: Insufficient documentation

## 2014-12-03 DIAGNOSIS — Z9861 Coronary angioplasty status: Secondary | ICD-10-CM | POA: Diagnosis not present

## 2014-12-03 DIAGNOSIS — M7591 Shoulder lesion, unspecified, right shoulder: Secondary | ICD-10-CM | POA: Diagnosis not present

## 2014-12-03 DIAGNOSIS — I10 Essential (primary) hypertension: Secondary | ICD-10-CM | POA: Insufficient documentation

## 2014-12-03 DIAGNOSIS — M25511 Pain in right shoulder: Secondary | ICD-10-CM | POA: Diagnosis present

## 2014-12-03 DIAGNOSIS — I2510 Atherosclerotic heart disease of native coronary artery without angina pectoris: Secondary | ICD-10-CM | POA: Diagnosis not present

## 2014-12-03 DIAGNOSIS — Z79899 Other long term (current) drug therapy: Secondary | ICD-10-CM | POA: Diagnosis not present

## 2014-12-03 DIAGNOSIS — I5022 Chronic systolic (congestive) heart failure: Secondary | ICD-10-CM | POA: Diagnosis not present

## 2014-12-03 DIAGNOSIS — M7581 Other shoulder lesions, right shoulder: Secondary | ICD-10-CM

## 2014-12-03 MED ORDER — HYDROCODONE-ACETAMINOPHEN 5-325 MG PO TABS: 1.0000 | ORAL_TABLET | Freq: Four times a day (QID) | ORAL | Status: DC | PRN

## 2014-12-03 MED ORDER — CAPSAICIN-MENTHOL-METHYL SAL 0.025-1-12 % EX CREA: 1.0000 "application " | TOPICAL_CREAM | Freq: Two times a day (BID) | CUTANEOUS | Status: DC | PRN

## 2014-12-03 NOTE — ED Provider Notes (Signed)
CSN: [id number]     Arrival date & time 12/03/14  4098 History   First MD Initiated Contact with Patient 12/03/14 [phone]     Chief Complaint  Patient presents with  . Shoulder Pain     (Consider location/radiation/quality/duration/timing/severity/associated sxs/prior Treatment) Patient is a 53 y.o. male presenting with shoulder pain. The history is provided by the patient.  Shoulder Pain Location:  Shoulder Time since incident:  2 days Injury: no   Shoulder location:  R shoulder Pain details:    Quality:  Sharp   Radiates to:  Does not radiate   Severity:  Moderate   Onset quality:  Gradual   Duration:  2 days   Timing:  Constant   Progression:  Waxing and waning Chronicity:  New Handedness:  Right-handed Dislocation: no   Prior injury to area:  No Relieved by:  NSAIDs Worsened by:  Movement Associated symptoms: stiffness   Associated symptoms: no decreased range of motion, no muscle weakness, no numbness, no swelling and no tingling     Past Medical History  Diagnosis Date  . Hypertension   . CAD (coronary artery disease)     a. Ant STEMI (01/2013):  LHC - dLM 20, pLAD 100 (DES x 2), CFX < 20, mRCA 100% (L-R collats)  . Ischemic cardiomyopathy     a. echo (01/2013):  mild LVH, EF 35-40%, AS and apical AK, PASP 34  . HLD (hyperlipidemia)   . Erosive gastritis     a. 11/2013 EGD: prepyloric region of stomach and gastric antrum.  . Duodenal ulcer     a. 11/2013 EGD: medium sized, non-bleeding ulcer @ duodenal bulb.  . Duodenitis without hemorrhage     a. 11/2013 EGD: erosive duodenitis w/o bleeding.  . Anemia     a. 11/2013 in setting of ulcer/gastritis.  . Chronic systolic CHF (congestive heart failure) (HCC) 10/13/2014   Past Surgical History  Procedure Laterality Date  . Pilonidal cyst excision  ~ 1995  . Coronary angioplasty with stent placement  01/2013  . Esophagogastroduodenoscopy N/A 11/16/2013    Procedure: ESOPHAGOGASTRODUODENOSCOPY (EGD);  Surgeon: Beverley Fiedler, MD;  Location[facility] ENDOSCOPY;  Service: Endoscopy;  Laterality: N/A;  . Coronary angiogram  02/10/2013    Procedure: CORONARY ANGIOGRAM;  Surgeon: Peter M Swaziland, MD;  Location: [facility] CATH LAB;  Service: Cardiovascular;;  . Percutaneous coronary stent intervention (pci-s)  02/10/2013    Procedure: PERCUTANEOUS CORONARY STENT INTERVENTION (PCI-S);  Surgeon: Peter M Swaziland, MD;  Location: [facility] CATH LAB;  Service: Cardiovascular;;  DES x2 Prox LAD to MID LAD   No family history on file. Social History  Substance Use Topics  . Smoking status: Former Smoker -- 0.50 packs/day for 34 years    Types: Cigarettes    Quit date: 04/13/2012  . Smokeless tobacco: Never Used  . Alcohol Use: No    Review of Systems  Musculoskeletal: Positive for stiffness.  All other systems reviewed and are negative.     Allergies  Review of patient's allergies indicates no known allergies.  Home Medications   Prior to Admission medications   Medication Sig Start Date End Date Taking? Authorizing Provider  aspirin EC 81 MG tablet Take 1 tablet (81 mg total) by mouth daily. Patient taking differently: Take 325 mg by mouth daily.  03/31/13   Richarda Overlie, MD  atorvastatin (LIPITOR) 80 MG tablet TAKE 1 TABLET BY MOUTH DAILY AT [facility] 12/01/14   Quentin Angst, MD  carvedilol (COREG) 6.25 MG tablet  TAKE 1 TABLET BY MOUTH 2 TIMES DAILY WITH A MEAL. 10/31/14   Peter M Swaz[place], MD  clopidogrel (PLAVIX) 75 MG tablet Take 1 tablet (75 mg total) by mouth daily. 04/06/14   Peter M [place], MD  iron polysaccharides (NIFEREX) 150 MG capsule Take 1 capsule (150 mg total) by mouth 2 (two) times daily. 11/24/13   Peter M [place], MD  lisinopril (PRINIVIL,ZESTRIL) 20 MG tablet Take 1 tablet (20 mg total) by mouth daily. 03/31/13   Richarda Overlie, MD  nitroGLYCERIN (NITROSTAT) 0.4 MG SL tablet Place 1 tablet (0.4 mg total) under the tongue every 5 (five) minutes as needed for chest pain. 02/12/13   Beatrice Lecher, PA-C  pantoprazole  (PROTONIX) 40 MG tablet TAKE ONE TABLET BY MOUTH TWICE DAILY 05/25/14   Peter M [place], MD  spironolactone (ALDACTONE) 25 MG tablet Take 0.5 tablets (12.5 mg total) by mouth daily. 07/24/14   Peter M [place], MD   BP 149/97 mmHg  Pulse 67  Temp(Src) 97.7 F (36.5 C) (Oral)  Resp 18  Wt 200 lb (90.719 kg)  SpO2 100% Physical Exam  Constitutional: He is oriented to person, place, and time. He appears well-developed and well-nourished. No distress.  HENT:  Head: Normocephalic and atraumatic.  Eyes: Conjunctivae are normal.  Neck: Neck supple. No tracheal deviation present.  Cardiovascular: Normal rate and regular rhythm.   Pulmonary/Chest: Effort normal. No respiratory distress.  Abdominal: Soft. He exhibits no distension.  Musculoskeletal:       Right shoulder: He exhibits tenderness (over anterior rotator cuff), crepitus (moderate with movement) and pain (with internal and external rotation). He exhibits normal range of motion, no bony tenderness, no swelling, no effusion, no deformity and normal strength.  Neurological: He is alert and oriented to person, place, and time.  Skin: Skin is warm and dry.  Psychiatric: He has a normal mood and affect.    ED Course  Procedures (including critical care time) Labs Review Labs Reviewed - No data to display  Imaging Review Dg Shoulder Right  12/03/2014  CLINICAL DATA:  Sharp right shoulder pain. EXAM: RIGHT SHOULDER - 2+ VIEW COMPARISON:  None. FINDINGS: There is no evidence of fracture or dislocation. There is no evidence of arthropathy or other focal bone abnormality. Soft tissues are unremarkable. IMPRESSION: Negative. Electronically Signed   By: Ted Mcalpine M.D.   On: 12/03/2014 07:57   I have personally reviewed and evaluated these images and lab results as part of my medical decision-making.   EKG Interpretation   Date/Time:  Sunday December 03 2014 08:17:55 EST Ventricular Rate:  69 PR Interval:  197 QRS Duration: 93 QT  Interval:  383 QTC Calculation: 410 R Axis:   36 Text Interpretation:  Sinus rhythm Anterior infarct, old Baseline wander  in lead(s) V1 Confirmed by Miyako Oelke MD, Preeti Winegardner ([id number]) on 12/03/2014 8:22:28  AM      MDM   Final diagnoses:  Rotator cuff tendonitis, right    54 year old male with previous cardiac history presents with musculoskeletal right shoulder pain that is tender to palpation elicited with movement. Notable crepitus is concerning for arthropathy versus tendinopathy. Plain film is unremarkable for acute traumatic findings or degenerative joint. Suspect tendinitis of the rotator cuff. Patient is on antiplatelet therapy for his previous STEMI so I recommended that he not take NSAIDs. He was provided topical capsaicin cream and given Norco for short term pain management prior to follow-up with primary care physician for referral to physical therapy and further evaluation.  Pain appears noncardiac currently. Screening EKG is unremarkable.    Lyndal Pulley, MD 12/03/14 [phone]

## 2014-12-03 NOTE — ED Notes (Addendum)
Pt from home c/o right shoulder pain (sharp in character) worse with movement since Friday.  He reports taking advil and icy hot without relief. Denies chest pain, shortness of breath or jaw pain, or nausea.

## 2014-12-03 NOTE — Discharge Instructions (Signed)

## 2014-12-06 ENCOUNTER — Emergency Department (HOSPITAL_COMMUNITY)
Admission: EM | Admit: 2014-12-06 | Discharge: 2014-12-06 | Disposition: A | Discharge status: Home / Self Care | Payer: Medicaid Other | Attending: Emergency Medicine | Admitting: Emergency Medicine

## 2014-12-06 ENCOUNTER — Encounter (HOSPITAL_COMMUNITY): Payer: Self-pay | Admitting: Emergency Medicine

## 2014-12-06 DIAGNOSIS — I2510 Atherosclerotic heart disease of native coronary artery without angina pectoris: Secondary | ICD-10-CM | POA: Diagnosis not present

## 2014-12-06 DIAGNOSIS — M25511 Pain in right shoulder: Secondary | ICD-10-CM | POA: Insufficient documentation

## 2014-12-06 DIAGNOSIS — Z79899 Other long term (current) drug therapy: Secondary | ICD-10-CM | POA: Insufficient documentation

## 2014-12-06 DIAGNOSIS — I10 Essential (primary) hypertension: Secondary | ICD-10-CM | POA: Insufficient documentation

## 2014-12-06 DIAGNOSIS — Z9889 Other specified postprocedural states: Secondary | ICD-10-CM | POA: Diagnosis not present

## 2014-12-06 DIAGNOSIS — Z87891 Personal history of nicotine dependence: Secondary | ICD-10-CM | POA: Diagnosis not present

## 2014-12-06 DIAGNOSIS — Z862 Personal history of diseases of the blood and blood-forming organs and certain disorders involving the immune mechanism: Secondary | ICD-10-CM | POA: Diagnosis not present

## 2014-12-06 DIAGNOSIS — E7800 Pure hypercholesterolemia, unspecified: Secondary | ICD-10-CM | POA: Insufficient documentation

## 2014-12-06 DIAGNOSIS — Z7902 Long term (current) use of antithrombotics/antiplatelets: Secondary | ICD-10-CM | POA: Insufficient documentation

## 2014-12-06 DIAGNOSIS — I5022 Chronic systolic (congestive) heart failure: Secondary | ICD-10-CM | POA: Insufficient documentation

## 2014-12-06 DIAGNOSIS — Z7982 Long term (current) use of aspirin: Secondary | ICD-10-CM | POA: Insufficient documentation

## 2014-12-06 DIAGNOSIS — Z8719 Personal history of other diseases of the digestive system: Secondary | ICD-10-CM | POA: Insufficient documentation

## 2014-12-06 MED ORDER — CYCLOBENZAPRINE HCL 10 MG PO TABS: 10.0000 mg | ORAL_TABLET | Freq: Three times a day (TID) | ORAL | Status: DC | PRN

## 2014-12-06 NOTE — ED Notes (Signed)
C/o right shoulder pain. Was seen at [facility] 2 days ago. Has been taking Vicodin and Advil w/o relief. Pt denies CP. Admits to SOB "sometimes". States has not had referral to ortho. Per pt.

## 2014-12-06 NOTE — ED Provider Notes (Signed)
CSN: [id number]     Arrival date & time 12/06/14  0932 History  By signing my name below, I, Freida Busman, attest that this documentation has been prepared under the direction and in the presence of non-physician practitioner, Trixie Dredge, PA-C. Electronically Signed: Freida Busman, Scribe. 12/06/2014. 10:33 AM.  Chief Complaint  Patient presents with  . Shoulder Pain   The history is provided by the patient. No language interpreter was used.   HPI Comments:  Jason Foster is a 54 y.o. male who presents to the Emergency Department complaining of sharp right shoulder pain that began on 12/01/14. Pt notes the pain is constant for the most part but can have 20 second episodes where the pain has resolved. He denies exacerbation of pain with overt movement but notes increased pain when he lays on the shoulder or leans to the side. Pt is not currently working and  denies repetitive movements of the shoulder with everyday activity. Pt has a h/o MI and states he presented with CP, SOB and LUE pain - this is not similar. He denies SOB, cough, fever, vomiting, HA, and CP. Pt was seen in ED on 12/03/14 for the same; was diagnosed with rotator cuff tendonitis and discharged with Vicodin which he has taken 2 of without relief. He has also taken tylenol arthritis and applied OTC patch to site without relief. No alleviating factors noted.Denies weakness or numbness of the right arm, denies neck pain.   Past Medical History  Diagnosis Date  . Hypertension   . CAD (coronary artery disease)     a. Ant STEMI (01/2013):  LHC - dLM 20, pLAD 100 (DES x 2), CFX < 20, mRCA 100% (L-R collats)  . Ischemic cardiomyopathy     a. echo (01/2013):  mild LVH, EF 35-40%, AS and apical AK, PASP 34  . HLD (hyperlipidemia)   . Erosive gastritis     a. 11/2013 EGD: prepyloric region of stomach and gastric antrum.  . Duodenal ulcer     a. 11/2013 EGD: medium sized, non-bleeding ulcer @ duodenal bulb.  . Duodenitis without  hemorrhage     a. 11/2013 EGD: erosive duodenitis w/o bleeding.  . Anemia     a. 11/2013 in setting of ulcer/gastritis.  . Chronic systolic CHF (congestive heart failure) (HCC) 10/13/2014   Past Surgical History  Procedure Laterality Date  . Pilonidal cyst excision  ~ 1995  . Coronary angioplasty with stent placement  01/2013  . Esophagogastroduodenoscopy N/A 11/16/2013    Procedure: ESOPHAGOGASTRODUODENOSCOPY (EGD);  Surgeon: Beverley Fiedler, MD;  Location: [facility] ENDOSCOPY;  Service: Endoscopy;  Laterality: N/A;  . Coronary angiogram  02/10/2013    Procedure: CORONARY ANGIOGRAM;  Surgeon: Peter M Swaziland, MD;  Location: [facility] CATH LAB;  Service: Cardiovascular;;  . Percutaneous coronary stent intervention (pci-s)  02/10/2013    Procedure: PERCUTANEOUS CORONARY STENT INTERVENTION (PCI-S);  Surgeon: Peter M Swaziland, MD;  Location: [facility] CATH LAB;  Service: Cardiovascular;;  DES x2 Prox LAD to MID LAD   No family history on file. Social History  Substance Use Topics  . Smoking status: Former Smoker -- 0.50 packs/day for 34 years    Types: Cigarettes    Quit date: 04/13/2012  . Smokeless tobacco: Never Used  . Alcohol Use: No    Review of Systems  Constitutional: Negative for fever and chills.  Respiratory: Negative for cough and shortness of breath.   Cardiovascular: Negative for chest pain.  Gastrointestinal: Negative for vomiting.  Musculoskeletal: Positive for  arthralgias. Negative for back pain, neck pain and neck stiffness.       Right shoulder  Skin: Negative for color change.  Allergic/Immunologic: Negative for immunocompromised state.  Neurological: Negative for headaches.  Psychiatric/Behavioral: Negative for self-injury.   Allergies  Review of patient's allergies indicates no known allergies.  Home Medications   Prior to Admission medications   Medication Sig Start Date End Date Taking? Authorizing Provider  aspirin EC 81 MG tablet Take 1 tablet (81 mg total) by mouth  daily. Patient taking differently: Take 325 mg by mouth daily.  03/31/13   Richarda Overlie, MD  atorvastatin (LIPITOR) 80 MG tablet TAKE 1 TABLET BY MOUTH DAILY AT [facility] 12/01/14   Quentin Angst, MD  Capsaicin-Menthol-Methyl Sal (CAPSAICIN-METHYL SAL-MENTHOL) 0.025-1-12 % CREA Apply 1 application topically 2 (two) times daily as needed. 12/03/14   Lyndal Pulley, MD  carvedilol (COREG) 6.25 MG tablet TAKE 1 TABLET BY MOUTH 2 TIMES DAILY WITH A MEAL. 10/31/14   Peter M Swaziland, MD  clopidogrel (PLAVIX) 75 MG tablet Take 1 tablet (75 mg total) by mouth daily. 04/06/14   Peter M Swaziland, MD  HYDROcodone-acetaminophen (NORCO/VICODIN) 5-325 MG tablet Take 1 tablet by mouth every 6 (six) hours as needed for severe pain. 12/03/14   Lyndal Pulley, MD  iron polysaccharides (NIFEREX) 150 MG capsule Take 1 capsule (150 mg total) by mouth 2 (two) times daily. 11/24/13   Peter M Swaziland, MD  lisinopril (PRINIVIL,ZESTRIL) 20 MG tablet Take 1 tablet (20 mg total) by mouth daily. 03/31/13   Richarda Overlie, MD  nitroGLYCERIN (NITROSTAT) 0.4 MG SL tablet Place 1 tablet (0.4 mg total) under the tongue every 5 (five) minutes as needed for chest pain. 02/12/13   Beatrice Lecher, PA-C  pantoprazole (PROTONIX) 40 MG tablet TAKE ONE TABLET BY MOUTH TWICE DAILY 05/25/14   Peter M Swaziland, MD  spironolactone (ALDACTONE) 25 MG tablet Take 0.5 tablets (12.5 mg total) by mouth daily. 07/24/14   Peter M Swaziland, MD   BP 143/92 mmHg  Pulse 73  Temp(Src) 98.1 F (36.7 C) (Oral)  Resp 18  Wt 195 lb (88.451 kg)  SpO2 100% Physical Exam  Constitutional: He is oriented to person, place, and time. He appears well-developed and well-nourished. No distress.  HENT:  Head: Normocephalic and atraumatic.  Neck: Neck supple.  Cardiovascular: Normal rate, regular rhythm and normal heart sounds.   Pulmonary/Chest: Effort normal and breath sounds normal. No respiratory distress. He has no wheezes. He has no rales.  Musculoskeletal:  Spine nontender,  no crepitus, or stepoffs. Mild tenderness anterior right shoulder Pain posteriorly when pushing against resistance No pain with internal/external rotation Negative drop test  Right shoulder no erythema, edema, or warmth  Right shoulder:  Strength 5/5, sensation intact, distal pulses intact.     Neurological: He is alert and oriented to person, place, and time.  Skin: Skin is warm. He is not diaphoretic.  Psychiatric: He has a normal mood and affect.  Nursing note and vitals reviewed.   ED Course  Procedures   DIAGNOSTIC STUDIES:  Oxygen Saturation is 100% on RA, normal by my interpretation.    COORDINATION OF CARE:  10:33 AM Discussed treatment plan with pt at bedside and pt agreed to plan.   Labs Review Labs Reviewed - No data to display  Imaging Review No results found.   MDM   Final diagnoses:  Right shoulder pain    Afebrile, nontoxic patient with right shoulder pain without trauma.  Seen  in ED 3 days ago dx with tendonitis.  Neurovascularly intact.  No CP, SOB, no neck pain.  Doubt radiculopathy, doubt PE, PNA, ACS.   D/C home with orthopedic referral, flexeril.  Discussed result, findings, treatment, and follow up  with patient.  Pt given return precautions.  Pt verbalizes understanding and agrees with plan.        I personally performed the services described in this documentation, which was scribed in my presence. The recorded information has been reviewed and is accurate.     Trixie Dredge, PA-C 12/06/14 1539  Margarita Grizzle, MD 12/06/14 [phone]

## 2014-12-06 NOTE — Discharge Instructions (Signed)
Read the information below.  Use the prescribed medication as directed.  Please discuss all new medications with your pharmacist.  You may return to the Emergency Department at any time for worsening condition or any new symptoms that concern you.    If you develop uncontrolled pain, weakness or numbness of the extremity, severe discoloration of the skin, or you are unable to use your arm, return to the ER for a recheck.      Joint Pain Joint pain, which is also called arthralgia, can be caused by many things. Joint pain often goes away when you follow your health care provider's instructions for relieving pain at home. However, joint pain can also be caused by conditions that require further treatment. Common causes of joint pain include:  Bruising in the area of the joint.  Overuse of the joint.  Wear and tear on the joints that occur with aging (osteoarthritis).  Various other forms of arthritis.  A buildup of a crystal form of uric acid in the joint (gout).  Infections of the joint (septic arthritis) or of the bone (osteomyelitis). Your health care provider may recommend medicine to help with the pain. If your joint pain continues, additional tests may be needed to diagnose your condition. HOME CARE INSTRUCTIONS Watch your condition for any changes. Follow these instructions as directed to lessen the pain that you are feeling.  Take medicines only as directed by your health care provider.  Rest the affected area for as long as your health care provider says that you should. If directed to do so, raise the painful joint above the level of your heart while you are sitting or lying down.  Do not do things that cause or worsen pain.  If directed, apply ice to the painful area:  Put ice in a plastic bag.  Place a towel between your skin and the bag.  Leave the ice on for 20 minutes, 2-3 times per day.  Wear an elastic bandage, splint, or sling as directed by your health care  provider. Loosen the elastic bandage or splint if your fingers or toes become numb and tingle, or if they turn cold and blue.  Begin exercising or stretching the affected area as directed by your health care provider. Ask your health care provider what types of exercise are safe for you.  Keep all follow-up visits as directed by your health care provider. This is important. SEEK MEDICAL CARE IF:  Your pain increases, and medicine does not help.  Your joint pain does not improve within 3 days.  You have increased bruising or swelling.  You have a fever.  You lose 10 lb (4.5 kg) or more without trying. SEEK IMMEDIATE MEDICAL CARE IF:  You are not able to move the joint.  Your fingers or toes become numb or they turn cold and blue.   This information is not intended to replace advice given to you by your health care provider. Make sure you discuss any questions you have with your health care provider.   Document Released: 12/30/2004 Document Revised: 01/20/2014 Document Reviewed: 10/11/2013 Elsevier Interactive Patient Education 2016 [employer]  Shoulder Pain The shoulder is the joint that connects your arms to your body. The bones that form the shoulder joint include the upper arm bone (humerus), the shoulder blade (scapula), and the collarbone (clavicle). The top of the humerus is shaped like a ball and fits into a rather flat socket on the scapula (glenoid cavity). A combination of muscles  and strong, fibrous tissues that connect muscles to bones (tendons) support your shoulder joint and hold the ball in the socket. Small, fluid-filled sacs (bursae) are located in different areas of the joint. They act as cushions between the bones and the overlying soft tissues and help reduce friction between the gliding tendons and the bone as you move your arm. Your shoulder joint allows a wide range of motion in your arm. This range of motion allows you to do things like scratch your back or  throw a ball. However, this range of motion also makes your shoulder more prone to pain from overuse and injury. Causes of shoulder pain can originate from both injury and overuse and usually can be grouped in the following four categories:  Redness, swelling, and pain (inflammation) of the tendon (tendinitis) or the bursae (bursitis).  Instability, such as a dislocation of the joint.  Inflammation of the joint (arthritis).  Broken bone (fracture). HOME CARE INSTRUCTIONS   Apply ice to the sore area.  Put ice in a plastic bag.  Place a towel between your skin and the bag.  Leave the ice on for 15-20 minutes, 3-4 times per day for the first 2 days, or as directed by your health care provider.  Stop using cold packs if they do not help with the pain.  If you have a shoulder sling or immobilizer, wear it as long as your caregiver instructs. Only remove it to shower or bathe. Move your arm as little as possible, but keep your hand moving to prevent swelling.  Squeeze a soft ball or foam pad as much as possible to help prevent swelling.  Only take over-the-counter or prescription medicines for pain, discomfort, or fever as directed by your caregiver. SEEK MEDICAL CARE IF:   Your shoulder pain increases, or new pain develops in your arm, hand, or fingers.  Your hand or fingers become cold and numb.  Your pain is not relieved with medicines. SEEK IMMEDIATE MEDICAL CARE IF:   Your arm, hand, or fingers are numb or tingling.  Your arm, hand, or fingers are significantly swollen or turn white or blue. MAKE SURE YOU:   Understand these instructions.  Will watch your condition.  Will get help right away if you are not doing well or get worse.   This information is not intended to replace advice given to you by your health care provider. Make sure you discuss any questions you have with your health care provider.   Document Released: 10/09/2004 Document Revised: 01/20/2014  Document Reviewed: 04/24/2014 Elsevier Interactive Patient Education [employer]

## 2014-12-11 ENCOUNTER — Inpatient Hospital Stay: Payer: Medicaid Other | Admitting: Family Medicine

## 2014-12-25 ENCOUNTER — Encounter: Payer: Self-pay | Admitting: Internal Medicine

## 2014-12-25 ENCOUNTER — Ambulatory Visit: Payer: Medicaid Other | Attending: Family Medicine | Admitting: Internal Medicine

## 2014-12-25 VITALS — BP 109/75 | HR 89 | Temp 98.7°F | Resp 16 | Ht 67.0 in | Wt 210.0 lb

## 2014-12-25 DIAGNOSIS — Z7982 Long term (current) use of aspirin: Secondary | ICD-10-CM | POA: Insufficient documentation

## 2014-12-25 DIAGNOSIS — I2510 Atherosclerotic heart disease of native coronary artery without angina pectoris: Secondary | ICD-10-CM | POA: Insufficient documentation

## 2014-12-25 DIAGNOSIS — M25511 Pain in right shoulder: Secondary | ICD-10-CM | POA: Diagnosis not present

## 2014-12-25 DIAGNOSIS — I5022 Chronic systolic (congestive) heart failure: Secondary | ICD-10-CM | POA: Insufficient documentation

## 2014-12-25 DIAGNOSIS — M5412 Radiculopathy, cervical region: Secondary | ICD-10-CM | POA: Diagnosis not present

## 2014-12-25 DIAGNOSIS — I255 Ischemic cardiomyopathy: Secondary | ICD-10-CM | POA: Diagnosis not present

## 2014-12-25 DIAGNOSIS — I10 Essential (primary) hypertension: Secondary | ICD-10-CM | POA: Insufficient documentation

## 2014-12-25 DIAGNOSIS — E785 Hyperlipidemia, unspecified: Secondary | ICD-10-CM | POA: Insufficient documentation

## 2014-12-25 DIAGNOSIS — Z79899 Other long term (current) drug therapy: Secondary | ICD-10-CM | POA: Insufficient documentation

## 2014-12-25 DIAGNOSIS — M47812 Spondylosis without myelopathy or radiculopathy, cervical region: Secondary | ICD-10-CM | POA: Insufficient documentation

## 2014-12-25 MED ORDER — GABAPENTIN 100 MG PO CAPS: 100.0000 mg | ORAL_CAPSULE | Freq: Three times a day (TID) | ORAL | Status: DC

## 2014-12-25 NOTE — Patient Instructions (Signed)
Neuropathic Pain Neuropathic pain is pain caused by damage to the nerves that are responsible for certain sensations in your body (sensory nerves). The pain can be caused by damage to:   The sensory nerves that send signals to your spinal cord and brain (peripheral nervous system).  The sensory nerves in your brain or spinal cord (central nervous system). Neuropathic pain can make you more sensitive to pain. What would be a minor sensation for most people may feel very painful if you have neuropathic pain. This is usually a long-term condition that can be difficult to treat. The type of pain can differ from person to person. It may start suddenly (acute), or it may develop slowly and last for a long time (chronic). Neuropathic pain may come and go as damaged nerves heal or may stay at the same level for years. It often causes emotional distress, loss of sleep, and a lower quality of life. CAUSES  The most common cause of damage to a sensory nerve is diabetes. Many other diseases and conditions can also cause neuropathic pain. Causes of neuropathic pain can be classified as:  Toxic. Many drugs and chemicals can cause toxic damage. The most common cause of toxic neuropathic pain is damage from drug treatment for cancer (chemotherapy).  Metabolic. This type of pain can happen when a disease causes imbalances that damage nerves. Diabetes is the most common of these diseases. Vitamin B deficiency caused by long-term alcohol abuse is another common cause.  Traumatic. Any injury that cuts, crushes, or stretches a nerve can cause damage and pain. A common example is feeling pain after losing an arm or leg (phantom limb pain).  Compression-related. If a sensory nerve gets trapped or compressed for a long period of time, the blood supply to the nerve can be cut off.  Vascular. Many blood vessel diseases can cause neuropathic pain by decreasing blood supply and oxygen to nerves.  Autoimmune. This type of  pain results from diseases in which the body's defense system mistakenly attacks sensory nerves. Examples of autoimmune diseases that can cause neuropathic pain include lupus and multiple sclerosis.  Infectious. Many types of viral infections can damage sensory nerves and cause pain. Shingles infection is a common cause of this type of pain.  Inherited. Neuropathic pain can be a symptom of many diseases that are passed down through families (genetic). SIGNS AND SYMPTOMS  The main symptom is pain. Neuropathic pain is often described as:  Burning.  Shock-like.  Stinging.  Hot or cold.  Itching. DIAGNOSIS  No single test can diagnose neuropathic pain. Your health care provider will do a physical exam and ask you about your pain. You may use a pain scale to describe how bad your pain is. You may also have tests to see if you have a high sensitivity to pain and to help find the cause and location of any sensory nerve damage. These tests may include:  Imaging studies, such as:  X-rays.  CT scan.  MRI.  Nerve conduction studies to test how well nerve signals travel through your sensory nerves (electrodiagnostic testing).  Stimulating your sensory nerves through electrodes on your skin and measuring the response in your spinal cord and brain (somatosensory evoked potentials). TREATMENT  Treatment for neuropathic pain may change over time. You may need to try different treatment options or a combination of treatments. Some options include:  Over-the-counter pain relievers.  Prescription medicines. Some medicines used to treat other conditions may also help neuropathic pain. These   include medicines to:  Control seizures (anticonvulsants).  Relieve depression (antidepressants).  Prescription-strength pain relievers (narcotics). These are usually used when other pain relievers do not help.  Transcutaneous nerve stimulation (TENS). This uses electrical currents to block painful nerve  signals. The treatment is painless.  Topical and local anesthetics. These are medicines that numb the nerves. They can be injected as a nerve block or applied to the skin.  Alternative treatments, such as:  Acupuncture.  Meditation.  Massage.  Physical therapy.  Pain management programs.  Counseling. HOME CARE INSTRUCTIONS  Learn as much as you can about your condition.  Take medicines only as directed by your health care provider.  Work closely with all your health care providers to find what works best for you.  Have a good support system at home.  Consider joining a chronic pain support group. SEEK MEDICAL CARE IF:  Your pain treatments are not helping.  You are having side effects from your medicines.  You are struggling with fatigue, mood changes, depression, or anxiety.   This information is not intended to replace advice given to you by your health care provider. Make sure you discuss any questions you have with your health care provider.   Document Released: 09/27/2003 Document Revised: 01/20/2014 Document Reviewed: 06/09/2013 Elsevier Interactive Patient Education 2016 Elsevier Inc.  

## 2014-12-25 NOTE — Progress Notes (Signed)
ER F/U Rt  Shoulder Pain no Hx injury  Pain scale # 7  No tobacco user  No suicidal thought

## 2014-12-25 NOTE — Progress Notes (Signed)
Patient ID: Jason Foster, male   DOB: November 03, 1960, 54 y.o.   MRN: [id number]   Jason Foster, is a 54 y.o. male  [id number]  [id number]  DOB - December 25, 1960  Chief Complaint  Patient presents with  . Shoulder Pain        Subjective:   Jason Foster is a 54 y.o. male with history of coronary artery disease, hypertension, hyperlipidemia, here today for a follow up ED visit. Patient was recently seen in the ED for sharp right shoulder pain that began on 12/01/2014. Patient said the pain is shooting, constant, radiating to upper arm and hand with some numbness in his finger tips. Pain is not exacerbated by movement but got worse mostly at night when he lays on the right side. Patient had been referred to the orthopedic surgery, and according to patient, he was told by the orthopedic surgeon that his pain is most likely from the neck, and that x-ray of the neck showed straight cervical spine. He had been offered various treatment modalities but for now he choses physical therapy. He has a follow up appointment coming up this afternoon. Pain is about 8 out of 10, he currently has enough pain medications and anti-inflammatory cream and gel for local application. Patient denies any associated headache, no chest pain, no cough, no shortness of breath, no leg swelling, no hand swelling. There was no preceding history of trauma or fall.  Problem  Cervical Spondyloarthritis    ALLERGIES: No Known Allergies  PAST MEDICAL HISTORY: Past Medical History  Diagnosis Date  . Hypertension   . CAD (coronary artery disease)     a. Ant STEMI (01/2013):  LHC - dLM 20, pLAD 100 (DES x 2), CFX < 20, mRCA 100% (L-R collats)  . Ischemic cardiomyopathy     a. echo (01/2013):  mild LVH, EF 35-40%, AS and apical AK, PASP 34  . HLD (hyperlipidemia)   . Erosive gastritis     a. 11/2013 EGD: prepyloric region of stomach and gastric antrum.  . Duodenal ulcer     a. 11/2013 EGD: medium sized, non-bleeding ulcer  @ duodenal bulb.  . Duodenitis without hemorrhage     a. 11/2013 EGD: erosive duodenitis w/o bleeding.  . Anemia     a. 11/2013 in setting of ulcer/gastritis.  . Chronic systolic CHF (congestive heart failure) (HCC) 10/13/2014    MEDICATIONS AT HOME: Prior to Admission medications   Medication Sig Start Date End Date Taking? Authorizing Provider  aspirin EC 81 MG tablet Take 1 tablet (81 mg total) by mouth daily. Patient taking differently: Take 325 mg by mouth daily.  03/31/13  Yes Richarda Overlie, MD  atorvastatin (LIPITOR) 80 MG tablet TAKE 1 TABLET BY MOUTH DAILY AT 6PM 12/01/14  Yes Aneira Cavitt E Hyman Hopes, MD  Capsaicin-Menthol-Methyl Sal (CAPSAICIN-METHYL SAL-MENTHOL) 0.025-1-12 % CREA Apply 1 application topically 2 (two) times daily as needed. 12/03/14  Yes Lyndal Pulley, MD  carvedilol (COREG) 6.25 MG tablet TAKE 1 TABLET BY MOUTH 2 TIMES DAILY WITH A MEAL. 10/31/14  Yes Peter M [place], MD  clopidogrel (PLAVIX) 75 MG tablet Take 1 tablet (75 mg total) by mouth daily. 04/06/14  Yes Peter M [place], MD  cyclobenzaprine (FLEXERIL) 10 MG tablet Take 1 tablet (10 mg total) by mouth 3 (three) times daily as needed for muscle spasms (or pain). 12/06/14  Yes Trixie Dredge, PA-C  iron polysaccharides (NIFEREX) 150 MG capsule Take 1 capsule (150 mg total) by mouth 2 (two) times daily. 11/24/13  Yes  Peter M [place], MD  lisinopril (PRINIVIL,ZESTRIL) 20 MG tablet Take 1 tablet (20 mg total) by mouth daily. 03/31/13  Yes Richarda Overlie, MD  nitroGLYCERIN (NITROSTAT) 0.4 MG SL tablet Place 1 tablet (0.4 mg total) under the tongue every 5 (five) minutes as needed for chest pain. 02/12/13  Yes Beatrice Lecher, PA-C  pantoprazole (PROTONIX) 40 MG tablet TAKE ONE TABLET BY MOUTH TWICE DAILY 05/25/14  Yes Peter M [place], MD  spironolactone (ALDACTONE) 25 MG tablet Take 0.5 tablets (12.5 mg total) by mouth daily. 07/24/14  Yes Peter M [place], MD  gabapentin (NEURONTIN) 100 MG capsule Take 1 capsule (100 mg total) by mouth 3  (three) times daily. 12/25/14   Quentin Angst, MD  HYDROcodone-acetaminophen (NORCO/VICODIN) 5-325 MG tablet Take 1 tablet by mouth every 6 (six) hours as needed for severe pain. Patient not taking: Reported on 12/25/2014 12/03/14   Lyndal Pulley, MD     Objective:   Filed Vitals:   12/25/14 0908  BP: 109/75  Pulse: 89  Temp: 98.7 F (37.1 C)  TempSrc: Oral  Resp: 16  Height:  (1.702 m)  Weight: 210 lb (95.255 kg)  SpO2: 98%    Exam General appearance : Awake, alert, not in any distress. Speech Clear. Not toxic looking HEENT: Atraumatic and Normocephalic, pupils equally reactive to light and accomodation Neck: supple, no JVD. No cervical lymphadenopathy.  Chest:Good air entry bilaterally, no added sounds  CVS: S1 S2 regular, no murmurs.  Abdomen: Bowel sounds present, Non tender and not distended with no gaurding, rigidity or rebound. Extremities: B/L Lower Ext shows no edema, both legs are warm to touch, no restriction in range of motions on right shoulder, but tenderness on deep palpation, no erythema, no rash, no swelling Neurology: Awake alert, and oriented X 3, CN II-XII intact, Non focal  Data Review Lab Results  Component Value Date   HGBA1C 5.60 07/13/2014   HGBA1C 6.1* 02/10/2013     Assessment & Plan   1. Essential hypertension, benign  We have discussed target BP range and blood pressure goal. I have advised patient to check BP regularly and to call us back or report to clinic if the numbers are consistently higher than 140/90. We discussed the importance of compliance with medical therapy and DASH diet recommended, consequences of uncontrolled hypertension discussed.   - continue current BP medications  2. Dyslipidemia  To address this please limit saturated fat to no more than 7% of your calories, limit cholesterol to 200 mg/day, increase fiber and exercise as tolerated. If needed we may add another cholesterol lowering medication to your  regimen.   3. Cervical neuropathic pain  Add - gabapentin (NEURONTIN) 100 MG capsule; Take 1 capsule (100 mg total) by mouth 3 (three) times daily.  Dispense: 90 capsule; Refill: 3 - Follow up with orthopedic surgery this afternoon - For Cervical MRI today  Patient have been counseled extensively about nutrition and exercise  Return in about 4 weeks (around 01/22/2015) for Follow up Pain and comorbidities, Follow up HTN.  The patient was given clear instructions to go to ER or return to medical center if symptoms don't improve, worsen or new problems develop. The patient verbalized understanding. The patient was told to call to get lab results if they haven't heard anything in the next week.   This note has been created with Education officer, environmental. Any transcriptional errors are unintentional.    Charron Coultas, MD, MHA, FACP,  Constance Goltz, CPE [facility] and [facility] [place], [place] [phone]   12/25/2014, 9:48 AM

## 2015-02-01 ENCOUNTER — Other Ambulatory Visit: Payer: Self-pay | Admitting: Cardiology

## 2015-02-01 MED FILL — CARVEDILOL 6.25 MG TABLET: 6.25 | 30 days supply | Qty: 60 | Fill #3

## 2015-02-01 MED FILL — ?PANTOPRAZOLE SOD DR 40MG: 40 MG | 30 days supply | Qty: 60 | Fill #6

## 2015-02-01 MED FILL — CLOPIDOGREL 75 MG TABLET: 75 | 90 days supply | Qty: 90 | Fill #3

## 2015-02-01 MED FILL — LISINOPRIL 20 MG TABLET: 20 | 30 days supply | Qty: 30 | Fill #9

## 2015-02-01 MED FILL — SPIRONOLACTONE 25 MG TABLET: 25 | 30 days supply | Qty: 15 | Fill #0

## 2015-02-01 NOTE — Telephone Encounter (Signed)
Rx request sent to pharmacy.  

## 2015-03-05 MED FILL — CARVEDILOL 6.25 MG TABLET: 6.25 | 30 days supply | Qty: 60 | Fill #4

## 2015-03-05 MED FILL — ATORVASTATIN 80 MG TABLET: 80 | 30 days supply | Qty: 30 | Fill #2

## 2015-03-05 MED FILL — PANTOPRAZOLE SOD DR 40 MG T: 40 | 30 days supply | Qty: 60 | Fill #0

## 2015-03-05 MED FILL — SPIRONOLACTONE 25 MG TABLET: 25 | 30 days supply | Qty: 15 | Fill #1

## 2015-03-05 MED FILL — LISINOPRIL 20 MG TABLET: 20 | 30 days supply | Qty: 30 | Fill #10

## 2015-03-26 MED FILL — GABAPENTIN 300 MG CAPSULE: 300 | 30 days supply | Qty: 30 | Fill #0

## 2015-03-26 MED FILL — METHYLPREDNISOLONE 4 MG TAB: 4 | 6 days supply | Qty: 21 | Fill #0

## 2015-03-30 ENCOUNTER — Encounter: Payer: Self-pay | Admitting: *Deleted

## 2015-04-03 MED FILL — LISINOPRIL 20 MG TABLET: 20 | 30 days supply | Qty: 30 | Fill #11

## 2015-04-03 MED FILL — SPIRONOLACTONE 25 MG TABLET: 25 | 30 days supply | Qty: 15 | Fill #2

## 2015-04-04 ENCOUNTER — Ambulatory Visit (INDEPENDENT_AMBULATORY_CARE_PROVIDER_SITE_OTHER): Payer: Medicaid Other | Admitting: Cardiology

## 2015-04-04 ENCOUNTER — Encounter: Payer: Self-pay | Admitting: Cardiology

## 2015-04-04 VITALS — BP 158/82 | HR 84 | Ht 66.0 in | Wt 213.6 lb

## 2015-04-04 DIAGNOSIS — I255 Ischemic cardiomyopathy: Secondary | ICD-10-CM

## 2015-04-04 DIAGNOSIS — I2510 Atherosclerotic heart disease of native coronary artery without angina pectoris: Secondary | ICD-10-CM | POA: Diagnosis not present

## 2015-04-04 DIAGNOSIS — E785 Hyperlipidemia, unspecified: Secondary | ICD-10-CM

## 2015-04-04 DIAGNOSIS — M47812 Spondylosis without myelopathy or radiculopathy, cervical region: Secondary | ICD-10-CM | POA: Diagnosis not present

## 2015-04-04 NOTE — Patient Instructions (Signed)
Continue your current therapy  I will see you in 6-8 months.  Get follow up blood work with primary care by June

## 2015-04-04 NOTE — Progress Notes (Signed)
Jason Foster Date of Birth: [birth year] Medical Record #[id number]  History of Present Illness: Jason Foster is seen for follow up CAD. He is s/p anterior STEMI on 02/10/13 treated with DES x 2 of the LAD. ( 3.0x 38 mm Promus and 3.5 x 32 Promus) He has a history of HTN and hyperlipidemia. He is a former smoker. On emergent cardiac cath he had occlusion of the proximal LAD. The RCA was also occluded in the mid vessel but this appeared chronic and was collateralized. EF was 35-40%. In November 2015 he was admitted with an UGI bleed. He was found to have a gastritis/duodenitis and duodenal ulcer. He was treated with PPI and Hgb later normalized.  On follow up today he is feeling very well. No chest pain or SOB. No edema or palpitations. He  works as a Community education officer. Admits he eats a poor American diet. He did develop right arm/hand numbness. MRI showed nerve compression C3-4. Managed conservatively for now.  Current Outpatient Prescriptions on File Prior to Visit  Medication Sig Dispense Refill  . aspirin EC 81 MG tablet Take 1 tablet (81 mg total) by mouth daily. (Patient taking differently: Take 325 mg by mouth daily. ) 30 tablet 11  . atorvastatin (LIPITOR) 80 MG tablet TAKE 1 TABLET BY MOUTH DAILY AT 6PM 30 tablet 2  . Capsaicin-Menthol-Methyl Sal (CAPSAICIN-METHYL SAL-MENTHOL) 0.025-1-12 % CREA Apply 1 application topically 2 (two) times daily as needed. 56.6 g 0  . carvedilol (COREG) 6.25 MG tablet TAKE 1 TABLET BY MOUTH 2 TIMES DAILY WITH A MEAL. 60 tablet 10  . clopidogrel (PLAVIX) 75 MG tablet Take 1 tablet (75 mg total) by mouth daily. 90 tablet 3  . cyclobenzaprine (FLEXERIL) 10 MG tablet Take 1 tablet (10 mg total) by mouth 3 (three) times daily as needed for muscle spasms (or pain). 15 tablet 0  . gabapentin (NEURONTIN) 100 MG capsule Take 1 capsule (100 mg total) by mouth 3 (three) times daily. 90 capsule 3  . HYDROcodone-acetaminophen (NORCO/VICODIN) 5-325 MG tablet Take 1 tablet by  mouth every 6 (six) hours as needed for severe pain. 6 tablet 0  . iron polysaccharides (NIFEREX) 150 MG capsule Take 1 capsule (150 mg total) by mouth 2 (two) times daily. 60 capsule 3  . lisinopril (PRINIVIL,ZESTRIL) 20 MG tablet Take 1 tablet (20 mg total) by mouth daily. 90 tablet 3  . nitroGLYCERIN (NITROSTAT) 0.4 MG SL tablet Place 1 tablet (0.4 mg total) under the tongue every 5 (five) minutes as needed for chest pain. 25 tablet 12  . pantoprazole (PROTONIX) 40 MG tablet TAKE ONE TABLET BY MOUTH TWICE DAILY 60 tablet 6  . spironolactone (ALDACTONE) 25 MG tablet TAKE 1/2 TABLET BY MOUTH DAILY 15 tablet 1   No current facility-administered medications on file prior to visit.    No Known Allergies  Past Medical History  Diagnosis Date  . Hypertension   . CAD (coronary artery disease)     a. Ant STEMI (01/2013):  LHC - dLM 20, pLAD 100 (DES x 2), CFX < 20, mRCA 100% (L-R collats)  . Ischemic cardiomyopathy     a. echo (01/2013):  mild LVH, EF 35-40%, AS and apical AK, PASP 34  . HLD (hyperlipidemia)   . Erosive gastritis     a. 11/2013 EGD: prepyloric region of stomach and gastric antrum.  . Duodenal ulcer     a. 11/2013 EGD: medium sized, non-bleeding ulcer @ duodenal bulb.  . Duodenitis without hemorrhage  a. 11/2013 EGD: erosive duodenitis w/o bleeding.  . Anemia     a. 11/2013 in setting of ulcer/gastritis.  . Chronic systolic CHF (congestive heart failure) (HCC) 10/13/2014    Past Surgical History  Procedure Laterality Date  . Pilonidal cyst excision  ~ 1995  . Coronary angioplasty with stent placement  01/2013  . Esophagogastroduodenoscopy N/A 11/16/2013    Procedure: ESOPHAGOGASTRODUODENOSCOPY (EGD);  Surgeon: Beverley Fiedler, MD;  Location: [facility] ENDOSCOPY;  Service: Endoscopy;  Laterality: N/A;  . Coronary angiogram  02/10/2013    Procedure: CORONARY ANGIOGRAM;  Surgeon: Peter M Swazil[place], MD;  Location[facility] CATH LAB;  Service: Cardiovascular;;  . Percutaneous coronary stent  intervention (pci-s)  02/10/2013    Procedure: PERCUTANEOUS CORONARY STENT INTERVENTION (PCI-S);  Surgeon: Peter M [place], MD;  Location[facility] CATH LAB;  Service: Cardiovascular;;  DES x2 Prox LAD to MID LAD    History  Smoking status  . Former Smoker -- 0.50 packs/day for 34 years  . Types: Cigarettes  . Quit date: 04/13/2012  Smokeless tobacco  . Never Used    History  Alcohol Use No    Family History  Problem Relation Age of Onset  . Family history unknown: Yes    Review of Systems: As noted in HPI.  All other systems were reviewed and are negative.  Physical Exam: BP 158/82 mmHg  Pulse 84  Ht  (1.676 m)  Wt 96.888 kg (213 lb 9.6 oz)  BMI 34.49 kg/m2 [name] male in NAD HEENT: [place]/AT, PERRL, EOMI, sclera are clear. Oropharynx is clear.  Neck: no JVD, bruits, adenopathy Lungs: clear CV: RRR, normal S1-2, No murmur or gallop Abdomen: soft, NT, BS+, no masses or bruits. Ext: no edema.  Pulses 2+ Neuro: alert, oriented x 3. Nonfocal.  LABORATORY DATA: Lab Results  Component Value Date   WBC 8.7 07/13/2014   HGB 14.1 07/13/2014   HCT 42.3 07/13/2014   PLT 214 07/13/2014   GLUCOSE 110* 07/13/2014   CHOL 142 07/13/2014   TRIG 133 07/13/2014   HDL 29* 07/13/2014   LDLCALC 86 07/13/2014   ALT 22 07/13/2014   AST 24 07/13/2014   NA 140 07/13/2014   K 5.2 07/13/2014   CL 102 07/13/2014   CREATININE 0.99 07/13/2014   BUN 16 07/13/2014   CO2 25 07/13/2014   TSH 0.847 07/13/2014   INR 1.26 11/14/2013   HGBA1C 5.60 07/13/2014     Assessment / Plan:   1. CAD s/p anterior MI with stenting of the LAD with DES x 2 in January 2015. Given extensive stenting in the LAD I would prefer long term DAPT.  Will treat chronic total occlusion of RCA medically.  He is asymptomatic now. Recommend increased aerobic activity. Try and lose weight and follow a healthier diet.  I will follow up in 6-8 months.  2. Ischemic cardiomyopathy. EF 35-40%. Continue Coreg,  lisinopril, aldactone.   3. HTN controlled.  4. Hyperlipidemia on high dose statin. Controlled.   5. History of gastritis/duondenitis and duodenal ulcer. If he has recurrent GIB may need to rethink DAPT. Currently no evidence of recurrent bleed.

## 2015-04-12 ENCOUNTER — Ambulatory Visit: Payer: Medicaid Other | Admitting: Cardiology

## 2015-05-04 ENCOUNTER — Other Ambulatory Visit: Payer: Self-pay | Admitting: Cardiology

## 2015-05-04 ENCOUNTER — Telehealth: Payer: Self-pay | Admitting: Cardiology

## 2015-05-04 MED ORDER — CARVEDILOL 6.25 MG PO TABS: 6.2500 mg | ORAL_TABLET | Freq: Two times a day (BID) | ORAL | Status: DC

## 2015-05-04 MED ORDER — SPIRONOLACTONE 25 MG PO TABS: 12.5000 mg | ORAL_TABLET | Freq: Every day | ORAL | Status: DC

## 2015-05-04 MED FILL — CARVEDILOL 6.25 MG TABLET: 6.25 | 30 days supply | Qty: 60 | Fill #5

## 2015-05-04 MED FILL — SPIRONOLACTONE 25 MG TABLET: 25 | 30 days supply | Qty: 15 | Fill #0

## 2015-05-04 MED FILL — ATORVASTATIN 80 MG TABLET: 80 | 30 days supply | Qty: 30 | Fill #3

## 2015-05-04 MED FILL — LISINOPRIL 20 MG TABLET: 20 | 30 days supply | Qty: 30 | Fill #0

## 2015-05-04 MED FILL — PANTOPRAZOLE SOD DR 40 MG T: 40 | 30 days supply | Qty: 60 | Fill #1

## 2015-05-04 NOTE — Telephone Encounter (Signed)
New message       *STAT* If patient is at the pharmacy, call can be transferred to refill team.   1. Which medications need to be refilled? (please list name of each medication and dose if known) spironlactone 25mg  and his bp medication carvedilol 6.25--(he thinks)  2. Which pharmacy/location (including street and city if local pharmacy) is medication to be sent to? [facility] 3. Do they need a 30 day or 90 day supply? Whichever one they will fill it for

## 2015-05-04 NOTE — Telephone Encounter (Signed)
Refills sent

## 2015-05-04 NOTE — Telephone Encounter (Signed)
Rx(s) sent to pharmacy electronically.  

## 2015-06-04 ENCOUNTER — Other Ambulatory Visit: Payer: Self-pay | Admitting: Internal Medicine

## 2015-06-04 ENCOUNTER — Other Ambulatory Visit: Payer: Self-pay | Admitting: Cardiology

## 2015-06-04 MED FILL — LISINOPRIL 20 MG TABLET: 20 | 30 days supply | Qty: 30 | Fill #1

## 2015-06-04 MED FILL — SPIRONOLACTONE 25 MG TABLET: 25 | 30 days supply | Qty: 15 | Fill #1

## 2015-06-04 MED FILL — PANTOPRAZOLE SOD DR 40 MG T: 40 | 30 days supply | Qty: 60 | Fill #2

## 2015-06-04 MED FILL — CARVEDILOL 6.25 MG TABLET: 6.25 | 30 days supply | Qty: 60 | Fill #6

## 2015-06-05 NOTE — Telephone Encounter (Signed)
Rx(s) sent to pharmacy electronically.  

## 2015-06-14 MED FILL — CLOPIDOGREL 75 MG TABLET: 75 | 30 days supply | Qty: 30 | Fill #0

## 2015-06-14 MED FILL — ATORVASTATIN 80 MG TABLET: 80 | 30 days supply | Qty: 30 | Fill #0

## 2015-07-03 MED FILL — LISINOPRIL 20 MG TABLET: 20 | 30 days supply | Qty: 30 | Fill #2

## 2015-07-03 MED FILL — SPIRONOLACTONE 25 MG TABLET: 25 | 30 days supply | Qty: 15 | Fill #2

## 2015-07-19 MED FILL — CLOPIDOGREL 75 MG TABLET: 75 | 30 days supply | Qty: 30 | Fill #1

## 2015-07-19 MED FILL — ATORVASTATIN 80 MG TABLET: 80 | 30 days supply | Qty: 30 | Fill #1

## 2015-07-31 ENCOUNTER — Other Ambulatory Visit: Payer: Self-pay | Admitting: Cardiology

## 2015-07-31 MED FILL — LISINOPRIL 20 MG TABLET: 20 | 30 days supply | Qty: 30 | Fill #3

## 2015-07-31 MED FILL — CARVEDILOL 6.25 MG TABLET: 6.25 | 30 days supply | Qty: 60 | Fill #7

## 2015-07-31 MED FILL — SPIRONOLACTONE 25 MG TABLET: 25 | 30 days supply | Qty: 15 | Fill #3

## 2015-07-31 MED FILL — PANTOPRAZOLE SOD DR 40 MG T: 40 | 30 days supply | Qty: 60 | Fill #0

## 2015-08-14 MED FILL — CLOPIDOGREL 75 MG TABLET: 75 | 30 days supply | Qty: 30 | Fill #2

## 2015-08-14 MED FILL — ATORVASTATIN 80 MG TABLET: 80 | 30 days supply | Qty: 30 | Fill #2

## 2015-08-30 ENCOUNTER — Ambulatory Visit: Payer: Medicaid Other | Attending: Internal Medicine | Admitting: Internal Medicine

## 2015-08-30 ENCOUNTER — Encounter: Payer: Self-pay | Admitting: Internal Medicine

## 2015-08-30 VITALS — BP 118/75 | HR 78 | Temp 98.3°F | Resp 18 | Ht 66.0 in | Wt 209.0 lb

## 2015-08-30 DIAGNOSIS — I10 Essential (primary) hypertension: Secondary | ICD-10-CM

## 2015-08-30 DIAGNOSIS — I252 Old myocardial infarction: Secondary | ICD-10-CM | POA: Diagnosis not present

## 2015-08-30 DIAGNOSIS — I509 Heart failure, unspecified: Secondary | ICD-10-CM | POA: Diagnosis not present

## 2015-08-30 DIAGNOSIS — I255 Ischemic cardiomyopathy: Secondary | ICD-10-CM | POA: Diagnosis not present

## 2015-08-30 DIAGNOSIS — M5412 Radiculopathy, cervical region: Secondary | ICD-10-CM

## 2015-08-30 DIAGNOSIS — E785 Hyperlipidemia, unspecified: Secondary | ICD-10-CM | POA: Diagnosis not present

## 2015-08-30 DIAGNOSIS — Z87891 Personal history of nicotine dependence: Secondary | ICD-10-CM | POA: Insufficient documentation

## 2015-08-30 DIAGNOSIS — Z7982 Long term (current) use of aspirin: Secondary | ICD-10-CM | POA: Diagnosis not present

## 2015-08-30 DIAGNOSIS — Z79899 Other long term (current) drug therapy: Secondary | ICD-10-CM | POA: Diagnosis not present

## 2015-08-30 DIAGNOSIS — I110 Hypertensive heart disease with heart failure: Secondary | ICD-10-CM | POA: Diagnosis present

## 2015-08-30 DIAGNOSIS — I2510 Atherosclerotic heart disease of native coronary artery without angina pectoris: Secondary | ICD-10-CM | POA: Diagnosis not present

## 2015-08-30 LAB — COMPLETE METABOLIC PANEL WITH GFR
ALBUMIN: 4 g/dL (ref 3.6–5.1)
ALK PHOS: 82 U/L (ref 40–115)
ALT: 18 U/L (ref 9–46)
AST: 21 U/L (ref 10–35)
BUN: 14 mg/dL (ref 7–25)
CO2: 20 mmol/L (ref 20–31)
Calcium: 9.1 mg/dL (ref 8.6–10.3)
Chloride: 103 mmol/L (ref 98–110)
Creat: 0.91 mg/dL (ref 0.70–1.33)
GLUCOSE: 125 mg/dL — AB (ref 65–99)
POTASSIUM: 4.2 mmol/L (ref 3.5–5.3)
SODIUM: 135 mmol/L (ref 135–146)
Total Bilirubin: 0.4 mg/dL (ref 0.2–1.2)
Total Protein: 7.6 g/dL (ref 6.1–8.1)

## 2015-08-30 LAB — LIPID PANEL
CHOL/HDL RATIO: 5.3 ratio — AB (ref <=5.0)
CHOLESTEROL: 149 mg/dL (ref 125–200)
HDL: 28 mg/dL — AB (ref >=40)
LDL Cholesterol: 76 mg/dL (ref <130)
TRIGLYCERIDES: 227 mg/dL — AB (ref <150)
VLDL: 45 mg/dL — ABNORMAL HIGH (ref <30)

## 2015-08-30 MED ORDER — GABAPENTIN 100 MG PO CAPS: 100.0000 mg | ORAL_CAPSULE | Freq: Three times a day (TID) | ORAL | 3 refills | Status: DC

## 2015-08-30 MED ORDER — POLYSACCHARIDE IRON COMPLEX 150 MG PO CAPS: 150.0000 mg | ORAL_CAPSULE | Freq: Two times a day (BID) | ORAL | 3 refills | Status: DC

## 2015-08-30 MED ORDER — SPIRONOLACTONE 25 MG PO TABS: 12.5000 mg | ORAL_TABLET | Freq: Every day | ORAL | 2 refills | Status: DC

## 2015-08-30 MED FILL — SPIRONOLACTONE 25 MG TABLET: 25 | 30 days supply | Qty: 15 | Fill #0

## 2015-08-30 MED FILL — GABAPENTIN 100 MG CAPSULE: 100 | 30 days supply | Qty: 90 | Fill #0

## 2015-08-30 NOTE — Progress Notes (Signed)
Jason MortMohamed Eoff, is a 55 y.o. male  [id number]  [id number]  DOB - 04/11/1960  Chief Complaint  Patient presents with  . Follow-up      Subjective:   Jason Foster is a 55 y.o. male with history of hypertension, hyperlipidemia, and coronary artery disease s/p anterior STEMI on 02/10/13 treated with DES x 2 of the LAD (3.0x 38 mm Promus and 3.5 x 32 Promus) here today for a follow up visit and medication refill. He is a former smoker. He has no new complaint today. He denies any chest pain or shortness of breath. No edema or palpitations. He has not been seen here since December 2016 but claims he has been doing very well at home. He is compliant with his medications, reports no side effects. He works as a Community education officercar salesman. He has history of right arm and hand numbness, MRI showed nerve compression at C3-C4, currently being managed conservatively. Blood pressure is controlled. Denies any pain today. Patient has No headache, No abdominal pain - No Nausea, No new weakness tingling or numbness.  ALLERGIES: No Known Allergies  PAST MEDICAL HISTORY: Past Medical History:  Diagnosis Date  . Anemia    a. 11/2013 in setting of ulcer/gastritis.  Marland Kitchen. CAD (coronary artery disease)    a. Ant STEMI (01/2013):  LHC - dLM 20, pLAD 100 (DES x 2), CFX < 20, mRCA 100% (L-R collats)  . Chronic systolic CHF (congestive heart failure) (HCC) 10/13/2014  . Duodenal ulcer    a. 11/2013 EGD: medium sized, non-bleeding ulcer @ duodenal bulb.  . Duodenitis without hemorrhage    a. 11/2013 EGD: erosive duodenitis w/o bleeding.  . Erosive gastritis    a. 11/2013 EGD: prepyloric region of stomach and gastric antrum.  Marland Kitchen. HLD (hyperlipidemia)   . Hypertension   . Ischemic cardiomyopathy    a. echo (01/2013):  mild LVH, EF 35-40%, AS and apical AK, PASP 34    MEDICATIONS AT HOME: Prior to Admission medications   Medication Sig Start Date End Date Taking? Authorizing Provider  aspirin EC 81 MG tablet Take 1  tablet (81 mg total) by mouth daily. Patient taking differently: Take 325 mg by mouth daily.  03/31/13  Yes Richarda OverlieNayana Abrol, MD  atorvastatin (LIPITOR) 80 MG tablet TAKE 1 TABLET BY MOUTH DAILY AT 6PM 06/04/15  Yes Octavion Mollenkopf E Hyman HopesJegede, MD  Capsaicin-Menthol-Methyl Sal (CAPSAICIN-METHYL SAL-MENTHOL) 0.025-1-12 % CREA Apply 1 application topically 2 (two) times daily as needed. 12/03/14  Yes Lyndal Pulleyaniel Knott, MD  carvedilol (COREG) 6.25 MG tablet Take 1 tablet (6.25 mg total) by mouth 2 (two) times daily with a meal. 05/04/15  Yes Peter M SwazilandJordan, MD  clopidogrel (PLAVIX) 75 MG tablet TAKE 1 TABLET BY MOUTH DAILY. 06/05/15  Yes Peter M [place]azilandJordan, MD  cyclobenzaprine (FLEXERIL) 10 MG tablet Take 1 tablet (10 mg total) by mouth 3 (three) times daily as needed for muscle spasms (or pain). 12/06/14  Yes Trixie DredgeEmily West, PA-C  gabapentin (NEURONTIN) 100 MG capsule Take 1 capsule (100 mg total) by mouth 3 (three) times daily. 08/30/15  Yes Quentin Angstlugbemiga E Pandora Mccrackin, MD  HYDROcodone-acetaminophen (NORCO/VICODIN) 5-325 MG tablet Take 1 tablet by mouth every 6 (six) hours as needed for severe pain. 12/03/14  Yes Lyndal Pulleyaniel Knott, MD  iron polysaccharides (NIFEREX) 150 MG capsule Take 1 capsule (150 mg total) by mouth 2 (two) times daily. 08/30/15  Yes Quentin Angstlugbemiga E Larnie Heart, MD  lisinopril (PRINIVIL,ZESTRIL) 20 MG tablet TAKE 1 TABLET BY MOUTH DAILY. 05/04/15  Yes Demetria PorePeter M  [place], MD  nitroGLYCERIN (NITROSTAT) 0.4 MG SL tablet Place 1 tablet (0.4 mg total) under the tongue every 5 (five) minutes as needed for chest pain. 02/12/13  Yes Beatrice Lecher, PA-C  pantoprazole (PROTONIX) 40 MG tablet TAKE ONE TABLET BY MOUTH TWICE DAILY 05/25/14  Yes Peter M [place], MD  spironolactone (ALDACTONE) 25 MG tablet Take 0.5 tablets (12.5 mg total) by mouth daily. 08/30/15  Yes Quentin Angst, MD     Objective:   Vitals:   08/30/15 1041  BP: 118/75  Pulse: 78  Resp: 18  Temp: 98.3 F (36.8 C)  TempSrc: Oral  SpO2: 100%  Weight: 209 lb (94.8 kg)    Height: 5\' 6"  (1.676 m)    Exam General appearance : Awake, alert, not in any distress. Speech Clear. Not toxic looking HEENT: Atraumatic and Normocephalic, pupils equally reactive to light and accomodation Neck: Supple, no JVD. No cervical lymphadenopathy.  Chest: Good air entry bilaterally, no added sounds  CVS: S1 S2 regular, no murmurs.  Abdomen: Bowel sounds present, Non tender and not distended with no gaurding, rigidity or rebound. Extremities: B/L Lower Ext shows no edema, both legs are warm to touch Neurology: Awake alert, and oriented X 3, CN II-XII intact, Non focal Skin: No Rash  Data Review Lab Results  Component Value Date   HGBA1C 5.60 07/13/2014   HGBA1C 6.1 (H) 02/10/2013     Assessment & Plan   1. Essential hypertension: Controlled  - COMPLETE METABOLIC PANEL WITH GFR  We have discussed target BP range and blood pressure goal. I have advised patient to check BP regularly and to call us back or report to clinic if the numbers are consistently higher than 140/90. We discussed the importance of compliance with medical therapy and DASH diet recommended, consequences of uncontrolled hypertension discussed.  - continue current BP medications  2. Dyslipidemia  - Lipid panel  To address this please limit saturated fat to no more than 7% of your calories, limit cholesterol to 200 mg/day, increase fiber and exercise as tolerated. If needed we may add another cholesterol lowering medication to your regimen.   3. Cervical neuropathic pain  - gabapentin (NEURONTIN) 100 MG capsule; Take 1 capsule (100 mg total) by mouth 3 (three) times daily.  Dispense: 90 capsule; Refill: 3  Patient have been counseled extensively about nutrition and exercise  Return in about 6 months (around 03/01/2016) for Heart Failure and Hypertension, Annual Physical.  The patient was given clear instructions to go to ER or return to medical center if symptoms don't improve, worsen or new  problems develop. The patient verbalized understanding. The patient was told to call to get lab results if they haven't heard anything in the next week.   This note has been created with Education officer, environmental. Any transcriptional errors are unintentional.    Jeanann Lewandowsky, MD, MHA, Maxwell Caul, CPE [facility] and [facility] [place], [place] [phone]   08/30/2015, 10:56 AM

## 2015-08-30 NOTE — Patient Instructions (Signed)
DASH Eating Plan °DASH stands for "Dietary Approaches to Stop Hypertension." The DASH eating plan is a healthy eating plan that has been shown to reduce high blood pressure (hypertension). Additional health benefits may include reducing the risk of type 2 diabetes mellitus, heart disease, and stroke. The DASH eating plan may also help with weight loss. °WHAT DO I NEED TO KNOW ABOUT THE DASH EATING PLAN? °For the DASH eating plan, you will follow these general guidelines: °· Choose foods with a percent daily value for sodium of less than 5% (as listed on the food label). °· Use salt-free seasonings or herbs instead of table salt or sea salt. °· Check with your health care provider or pharmacist before using salt substitutes. °· Eat lower-sodium products, often labeled as "lower sodium" or "no salt added." °· Eat fresh foods. °· Eat more vegetables, fruits, and low-fat dairy products. °· Choose whole grains. Look for the word "whole" as the first word in the ingredient list. °· Choose fish and skinless chicken or turkey more often than red meat. Limit fish, poultry, and meat to 6 oz (170 g) each day. °· Limit sweets, desserts, sugars, and sugary drinks. °· Choose heart-healthy fats. °· Limit cheese to 1 oz (28 g) per day. °· Eat more home-cooked food and less restaurant, buffet, and fast food. °· Limit fried foods. °· Cook foods using methods other than frying. °· Limit canned vegetables. If you do use them, rinse them well to decrease the sodium. °· When eating at a restaurant, ask that your food be prepared with less salt, or no salt if possible. °WHAT FOODS CAN I EAT? °Seek help from a dietitian for individual calorie needs. °Grains °Whole grain or whole wheat bread. Brown rice. Whole grain or whole wheat pasta. Quinoa, bulgur, and whole grain cereals. Low-sodium cereals. Corn or whole wheat flour tortillas. Whole grain cornbread. Whole grain crackers. Low-sodium crackers. °Vegetables °Fresh or frozen vegetables  (raw, steamed, roasted, or grilled). Low-sodium or reduced-sodium tomato and vegetable juices. Low-sodium or reduced-sodium tomato sauce and paste. Low-sodium or reduced-sodium canned vegetables.  °Fruits °All fresh, canned (in natural juice), or frozen fruits. °Meat and Other Protein Products °Ground beef (85% or leaner), grass-fed beef, or beef trimmed of fat. Skinless chicken or turkey. Ground chicken or turkey. Pork trimmed of fat. All fish and seafood. Eggs. Dried beans, peas, or lentils. Unsalted nuts and seeds. Unsalted canned beans. °Dairy °Low-fat dairy products, such as skim or 1% milk, 2% or reduced-fat cheeses, low-fat ricotta or cottage cheese, or plain low-fat yogurt. Low-sodium or reduced-sodium cheeses. °Fats and Oils °Tub margarines without trans fats. Light or reduced-fat mayonnaise and salad dressings (reduced sodium). Avocado. Safflower, olive, or canola oils. Natural peanut or almond butter. °Other °Unsalted popcorn and pretzels. °The items listed above may not be a complete list of recommended foods or beverages. Contact your dietitian for more options. °WHAT FOODS ARE NOT RECOMMENDED? °Grains °White bread. White pasta. White rice. Refined cornbread. Bagels and croissants. Crackers that contain trans fat. °Vegetables °Creamed or fried vegetables. Vegetables in a cheese sauce. Regular canned vegetables. Regular canned tomato sauce and paste. Regular tomato and vegetable juices. °Fruits °Dried fruits. Canned fruit in light or heavy syrup. Fruit juice. °Meat and Other Protein Products °Fatty cuts of meat. Ribs, chicken wings, bacon, sausage, bologna, salami, chitterlings, fatback, hot dogs, bratwurst, and packaged luncheon meats. Salted nuts and seeds. Canned beans with salt. °Dairy °Whole or 2% milk, cream, half-and-half, and cream cheese. Whole-fat or sweetened yogurt. Full-fat   cheeses or blue cheese. Nondairy creamers and whipped toppings. Processed cheese, cheese spreads, or cheese  curds. °Condiments °Onion and garlic salt, seasoned salt, table salt, and sea salt. Canned and packaged gravies. Worcestershire sauce. Tartar sauce. Barbecue sauce. Teriyaki sauce. Soy sauce, including reduced sodium. Steak sauce. Fish sauce. Oyster sauce. Cocktail sauce. Horseradish. Ketchup and mustard. Meat flavorings and tenderizers. Bouillon cubes. Hot sauce. Tabasco sauce. Marinades. Taco seasonings. Relishes. °Fats and Oils °Butter, stick margarine, lard, shortening, ghee, and bacon fat. Coconut, palm kernel, or palm oils. Regular salad dressings. °Other °Pickles and olives. Salted popcorn and pretzels. °The items listed above may not be a complete list of foods and beverages to avoid. Contact your dietitian for more information. °WHERE CAN I FIND MORE INFORMATION? °National Heart, Lung, and [employer] Institute: www.nhlbi.nih.gov/health/health-topics/topics/dash/ °  °This information is not intended to replace advice given to you by your health care provider. Make sure you discuss any questions you have with your health care provider. °  °Document Released: 12/19/2010 Document Revised: 01/20/2014 Document Reviewed: 11/03/2012 °Elsevier Interactive Patient Education ©2016 Elsevier Inc. ° °Hypertension °Hypertension, commonly called high blood pressure, is when the force of blood pumping through your arteries is too strong. Your arteries are the blood vessels that carry blood from your heart throughout your body. A blood pressure reading consists of a higher number over a lower number, such as 110/72. The higher number (systolic) is the pressure inside your arteries when your heart pumps. The lower number (diastolic) is the pressure inside your arteries when your heart relaxes. Ideally you want your blood pressure below 120/80. °Hypertension forces your heart to work harder to pump blood. Your arteries may become narrow or stiff. Having untreated or uncontrolled hypertension can cause heart attack, stroke, kidney  disease, and other problems. °RISK FACTORS °Some risk factors for high blood pressure are controllable. Others are not.  °Risk factors you cannot control include:  °· Race. You may be at higher risk if you are African American. °· Age. Risk increases with age. °· Gender. Men are at higher risk than women before age 45 years. After age 65, women are at higher risk than men. °Risk factors you can control include: °· Not getting enough exercise or physical activity. °· Being overweight. °· Getting too much fat, sugar, calories, or salt in your diet. °· Drinking too much alcohol. °SIGNS AND SYMPTOMS °Hypertension does not usually cause signs or symptoms. Extremely high blood pressure (hypertensive crisis) may cause headache, anxiety, shortness of breath, and nosebleed. °DIAGNOSIS °To check if you have hypertension, your health care provider will measure your blood pressure while you are seated, with your arm held at the level of your heart. It should be measured at least twice using the same arm. Certain conditions can cause a difference in blood pressure between your right and left arms. A blood pressure reading that is higher than normal on one occasion does not mean that you need treatment. If it is not clear whether you have high blood pressure, you may be asked to return on a different day to have your blood pressure checked again. Or, you may be asked to monitor your blood pressure at home for 1 or more weeks. °TREATMENT °Treating high blood pressure includes making lifestyle changes and possibly taking medicine. Living a healthy lifestyle can help lower high blood pressure. You may need to change some of your habits. °Lifestyle changes may include: °· Following the DASH diet. This diet is high in fruits, vegetables, and whole   grains. It is low in salt, red meat, and added sugars. °· Keep your sodium intake below 2,300 mg per day. °· Getting at least 30-45 minutes of aerobic exercise at least 4 times per  week. °· Losing weight if necessary. °· Not smoking. °· Limiting alcoholic beverages. °· Learning ways to reduce stress. °Your health care provider may prescribe medicine if lifestyle changes are not enough to get your blood pressure under control, and if one of the following is true: °· You are 18-59 years of age and your systolic blood pressure is above 140. °· You are 60 years of age or older, and your systolic blood pressure is above 150. °· Your diastolic blood pressure is above 90. °· You have diabetes, and your systolic blood pressure is over 140 or your diastolic blood pressure is over 90. °· You have kidney disease and your blood pressure is above 140/90. °· You have heart disease and your blood pressure is above 140/90. °Your personal target blood pressure may vary depending on your medical conditions, your age, and other factors. °HOME CARE INSTRUCTIONS °· Have your blood pressure rechecked as directed by your health care provider.   °· Take medicines only as directed by your health care provider. Follow the directions carefully. Blood pressure medicines must be taken as prescribed. The medicine does not work as well when you skip doses. Skipping doses also puts you at risk for problems. °· Do not smoke.   °· Monitor your blood pressure at home as directed by your health care provider.  °SEEK MEDICAL CARE IF:  °· You think you are having a reaction to medicines taken. °· You have recurrent headaches or feel dizzy. °· You have swelling in your ankles. °· You have trouble with your vision. °SEEK IMMEDIATE MEDICAL CARE IF: °· You develop a severe headache or confusion. °· You have unusual weakness, numbness, or feel faint. °· You have severe chest or abdominal pain. °· You vomit repeatedly. °· You have trouble breathing. °MAKE SURE YOU:  °· Understand these instructions. °· Will watch your condition. °· Will get help right away if you are not doing well or get worse. °  °This information is not intended to  replace advice given to you by your health care provider. Make sure you discuss any questions you have with your health care provider. °  °Document Released: 12/30/2004 Document Revised: 05/16/2014 Document Reviewed: 10/22/2012 °Elsevier Interactive Patient Education ©2016 Elsevier Inc. ° °

## 2015-08-30 NOTE — Progress Notes (Signed)
Patient is here for FU  Patient denies pain at this time.  Patient refused flu vaccine today.  Patient denies any suicidal ideations at this time.

## 2015-08-31 MED FILL — LISINOPRIL 20 MG TABLET: 20 | 30 days supply | Qty: 30 | Fill #4

## 2015-08-31 MED FILL — CARVEDILOL 6.25 MG TABLET: 6.25 | 30 days supply | Qty: 60 | Fill #8

## 2015-08-31 MED FILL — SPIRONOLACTONE 25 MG TABLET: 25 | 30 days supply | Qty: 15 | Fill #4

## 2015-09-03 ENCOUNTER — Telehealth: Payer: Self-pay | Admitting: Internal Medicine

## 2015-09-03 NOTE — Telephone Encounter (Signed)
-----   Message from Quentin Angst, MD sent at 09/03/2015  5:14 PM EDT ----- Please inform patient that his lab results are within normal normal

## 2015-09-03 NOTE — Telephone Encounter (Signed)
Medical Assistant left message on patient's home and cell voicemail. Voicemail states to give a call back to [place] with [facility] at [phone].  !!!Please inform patient of blood work being normal!!!

## 2015-10-05 ENCOUNTER — Encounter: Payer: Self-pay | Admitting: Physician Assistant

## 2015-10-08 ENCOUNTER — Ambulatory Visit (INDEPENDENT_AMBULATORY_CARE_PROVIDER_SITE_OTHER): Payer: Medicaid Other | Admitting: Physician Assistant

## 2015-10-08 ENCOUNTER — Encounter: Payer: Self-pay | Admitting: Physician Assistant

## 2015-10-08 VITALS — BP 120/81 | HR 76 | Ht 67.0 in | Wt 207.4 lb

## 2015-10-08 DIAGNOSIS — I10 Essential (primary) hypertension: Secondary | ICD-10-CM

## 2015-10-08 DIAGNOSIS — E785 Hyperlipidemia, unspecified: Secondary | ICD-10-CM | POA: Diagnosis not present

## 2015-10-08 DIAGNOSIS — I2510 Atherosclerotic heart disease of native coronary artery without angina pectoris: Secondary | ICD-10-CM | POA: Diagnosis not present

## 2015-10-08 DIAGNOSIS — Z79899 Other long term (current) drug therapy: Secondary | ICD-10-CM

## 2015-10-08 MED FILL — CARVEDILOL 6.25 MG TABLET: 6.25 | 30 days supply | Qty: 60 | Fill #9

## 2015-10-08 MED FILL — ATORVASTATIN 80 MG TABLET: 80 | 30 days supply | Qty: 30 | Fill #3

## 2015-10-08 MED FILL — LISINOPRIL 20 MG TABLET: 20 | 30 days supply | Qty: 30 | Fill #5

## 2015-10-08 MED FILL — CLOPIDOGREL 75 MG TABLET: 75 | 30 days supply | Qty: 30 | Fill #3

## 2015-10-08 MED FILL — SPIRONOLACTONE 25 MG TABLET: 25 | 30 days supply | Qty: 15 | Fill #1

## 2015-10-08 NOTE — Progress Notes (Signed)
Cardiology Office Note    Date:  10/08/2015   ID:  Jason Foster, DOB March 07, 1960, MRN [id number]  PCP:  Jason Lewandowsky, Foster  Cardiologist:  Dr. Swaziland  Chief Complaint  Patient presents with  . Follow-up    seen for Dr. Swaziland, h/o CAD    History of Present Illness:  Jason Foster is a 55 y.o. male with PMH of HTN, HLD, ICM with improved EF, chronic systolic heart failure and CAD. He originally had anterior STEMI on 02/10/2013 treated with DES 2 of LAD. He is a former smoker. RCA at the time of cath was also occluded in mid vessel but appears to be chronic and collateralized. EF was 35-40%. Last echocardiogram obtained on 04/04/2013 showed EF 55-60%, mid and basal anterior, mid anteroseptal and distal septal wall hypokinesis. In November 2015, he was admitted for upper GI bleed and found to have gastritis and duodenitis and duodenal ulcer. He was treated with PPI and hemoglobin later normalized.  He was last seen in the cardiology office in March 2017 at which time he was doing well. He presents today for six-month follow-up, he denies any recent chest pain or shortness of breath. 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 He says he has not been taking his Lipitor as instructed on daily basis, which is partially responsible for his recent lipid panel showing a jump in the triglyceride level up to 227, cholesterol 149, LDL 76. EKG today showed no significant change when compared to the previous EKG. He will follow-up in 6 months. I have decided to hold off on adding omega-3 fatty acids for the hypertriglyceridemia, I asked him to take his cholesterol medication on a daily basis and recheck his fasting lipid panel in 3 month before follow-up in 6 months.    Past Medical History:  Diagnosis Date  . Anemia    a. 11/2013 in setting of ulcer/gastritis.  Marland Kitchen CAD (coronary artery disease)    a. Ant STEMI (01/2013):  LHC - dLM 20, pLAD 100 (DES x 2), CFX < 20,  mRCA 100% (L-R collats)  . Chronic systolic CHF (congestive heart failure) (HCC) 10/13/2014  . Duodenal ulcer    a. 11/2013 EGD: medium sized, non-bleeding ulcer @ duodenal bulb.  . Duodenitis without hemorrhage    a. 11/2013 EGD: erosive duodenitis w/o bleeding.  . Erosive gastritis    a. 11/2013 EGD: prepyloric region of stomach and gastric antrum.  Marland Kitchen HLD (hyperlipidemia)   . Hypertension   . Ischemic cardiomyopathy    a. echo (01/2013):  mild LVH, EF 35-40%, AS and apical AK, PASP 34    Past Surgical History:  Procedure Laterality Date  . CORONARY ANGIOGRAM  02/10/2013   Procedure: CORONARY ANGIOGRAM;  Surgeon: Jason M Jason Foster;  Location: [facility] CATH LAB;  Service: Cardiovascular;;  . CORONARY ANGIOPLASTY WITH STENT PLACEMENT  01/2013  . ESOPHAGOGASTRODUODENOSCOPY N/A 11/16/2013   Procedure: ESOPHAGOGASTRODUODENOSCOPY (EGD);  Surgeon: Jason Fiedler, Foster;  Location: [facility] ENDOSCOPY;  Service: Endoscopy;  Laterality: N/A;  . PERCUTANEOUS CORONARY STENT INTERVENTION (PCI-S)  02/10/2013   Procedure: PERCUTANEOUS CORONARY STENT INTERVENTION (PCI-S);  Surgeon: Jason M Jason Foster;  Location: [facility] CATH LAB;  Service: Cardiovascular;;  DES x2 Prox LAD to MID LAD  . PILONIDAL CYST EXCISION  ~ 1995    Current Medications: Outpatient Medications Prior to Visit  Medication Sig Dispense Refill  . atorvastatin (LIPITOR) 80 MG tablet TAKE 1 TABLET BY MOUTH DAILY AT  6PM 30 tablet 3  . carvedilol (COREG) 6.25 MG tablet Take 1 tablet (6.25 mg total) by mouth 2 (two) times daily with a meal. 180 tablet 2  . clopidogrel (PLAVIX) 75 MG tablet TAKE 1 TABLET BY MOUTH DAILY. 90 tablet 3  . lisinopril (PRINIVIL,ZESTRIL) 20 MG tablet TAKE 1 TABLET BY MOUTH DAILY. 90 tablet 3  . nitroGLYCERIN (NITROSTAT) 0.4 MG SL tablet Place 1 tablet (0.4 mg total) under the tongue every 5 (five) minutes as needed for chest pain. 25 tablet 12  . pantoprazole (PROTONIX) 40 MG tablet TAKE ONE TABLET BY MOUTH TWICE DAILY 60 tablet 6  .  spironolactone (ALDACTONE) 25 MG tablet Take 0.5 tablets (12.5 mg total) by mouth daily. 45 tablet 2  . aspirin EC 81 MG tablet Take 1 tablet (81 mg total) by mouth daily. (Patient taking differently: Take 325 mg by mouth daily. ) 30 tablet 11  . Capsaicin-Menthol-Methyl Sal (CAPSAICIN-METHYL SAL-MENTHOL) 0.025-1-12 % CREA Apply 1 application topically 2 (two) times daily as needed. 56.6 g 0  . cyclobenzaprine (FLEXERIL) 10 MG tablet Take 1 tablet (10 mg total) by mouth 3 (three) times daily as needed for muscle spasms (or pain). 15 tablet 0  . gabapentin (NEURONTIN) 100 MG capsule Take 1 capsule (100 mg total) by mouth 3 (three) times daily. 90 capsule 3  . HYDROcodone-acetaminophen (NORCO/VICODIN) 5-325 MG tablet Take 1 tablet by mouth every 6 (six) hours as needed for severe pain. 6 tablet 0  . iron polysaccharides (NIFEREX) 150 MG capsule Take 1 capsule (150 mg total) by mouth 2 (two) times daily. 60 capsule 3   No facility-administered medications prior to visit.      Allergies:   Review of patient's allergies indicates no known allergies.   Social History   Social History  . Marital status: Married    Spouse name: N/A  . Number of children: 3  . Years of education: N/A   Occupational History  . [employer]   Social History Main Topics  . Smoking status: Former Smoker    Packs/day: 0.50    Years: 34.00    Types: Cigarettes    Quit date: 04/13/2012  . Smokeless tobacco: Never Used  . Alcohol use No  . Drug use: No  . Sexual activity: No   Other Topics Concern  . None   Social History Narrative  . None     Family History:  The patient's Family history is unknown by patient.   ROS:   Please see the history of present illness.    ROS All other systems reviewed and are negative.   PHYSICAL EXAM:   VS:  BP 120/81   Pulse 76   Ht 5\' 7"  (1.702 m)   Wt 207 lb 6.4 oz (94.1 kg)   BMI 32.48 kg/m    GEN: Well nourished, well developed, in no  acute distress  HEENT: normal  Neck: no JVD, carotid bruits, or masses Cardiac: RRR; no murmurs, rubs, or gallops,no edema  Respiratory:  clear to auscultation bilaterally, normal work of breathing GI: soft, nontender, nondistended, + BS MS: no deformity or atrophy  Skin: warm and dry, no rash Neuro:  Alert and Oriented x 3, Strength and sensation are intact Psych: euthymic mood, full affect  Wt Readings from Last 3 Encounters:  10/08/15 207 lb 6.4 oz (94.1 kg)  08/30/15 209 lb (94.8 kg)  04/04/15 213 lb 9.6 oz (96.9 kg)      Studies/Labs Reviewed:  EKG:  EKG is ordered today.  The ekg ordered today demonstrates NSR with poor R wave progression in anterior leads  Recent Labs: 08/30/2015: ALT 18; BUN 14; Creat 0.91; Potassium 4.2; Sodium 135   Lipid Panel    Component Value Date/Time   CHOL 149 08/30/2015 1109   TRIG 227 (H) 08/30/2015 1109   HDL 28 (L) 08/30/2015 1109   CHOLHDL 5.3 (H) 08/30/2015 1109   VLDL 45 (H) 08/30/2015 1109   LDLCALC 76 08/30/2015 1109    Additional studies/ records that were reviewed today include:   Cath 02/10/2013 PROCEDURAL FINDINGS Hemodynamics: AO 131/89 mean 110 mm Hg LV N/A              Coronary angiography: Coronary dominance: right  Left mainstem: 20% at the very distal left main.  Left anterior descending (LAD): 100% proximal.   Ramus intermediate: moderate to large in size. Normal.   Left circumflex (LCx): The LCx gives off 3 OM branches before terminating in the AV groove. Mild irregularities less than 20%.  Right coronary artery (RCA): 100% occlusion in the mid vessel. Left to right collaterals to the distal vessel.   Left ventriculography: Not doen  PCI Data: Vessel - LAD/Segment - proximal Percent Stenosis (pre)  100% TIMI-flow 0 Stent 3.0 x 38 mm Promus, 3.5 x 32 mm Promus Percent Stenosis (post) 0% TIMI-flow (post) 3  Final Conclusions:   1. Severe 2 vessel obstructive CAD 2. Successful stenting of  the proximal to mid LAD with overlapping DES.     Recommendations:  Continue dual antiplatelet therapy indefinitely. Aggressive risk factor modification. Will obtain an Echo to assess LV function. Would treat RCA disease medically. If he has refractory angina could consider CTO intervention in the future.    Echo 04/04/2013 LV EF: 55% -  60%  ------------------------------------------------------------ Indications:   Cardiomyopathy - ischemic 414.8.  ------------------------------------------------------------ History:  PMH: Acquired from the patient and from the patient's chart. Ischemic cardiomyopathy, with an ejection fraction of 40%by echocardiography. The dysfunction is primarily systolic. PMH:  Myocardial infarction. Risk factors: Former tobacco use. Hypertension. Dyslipidemia.  ------------------------------------------------------------ Study Conclusions  - Left ventricle: There is mid and basal anterior, mid anteroseptal and distal septal wall hypokinesis. Overal LVEF is preserved. The cavity size was normal. Systolic function was normal. The estimated ejection fraction was in the range of 55% to 60%. Doppler parameters are consistent with abnormal left ventricular relaxation (grade 1 diastolic dysfunction). There was no evidence of elevated ventricular filling pressure by Doppler parameters. - Aortic valve: Trileaflet; mildly thickened, mildly calcified leaflets. Transvalvular velocity was within the normal range. There was no stenosis. No regurgitation. - Aortic root: The aortic root was normal in size. - Mitral valve: No regurgitation. - Right ventricle: Systolic function was normal. - Pulmonic valve: Trivial regurgitation. - Pulmonary arteries: Systolic pressure was within the normal range. - Pericardium, extracardiac: There was no pericardial effusion. Impressions:  - Compared to the prior study LVEF has improved and is  now normal.   ASSESSMENT:    1. Coronary artery disease involving native coronary artery of native heart without angina pectoris   2. Dyslipidemia   3. Medication management   4. Essential hypertension      PLAN:  In order of problems listed above:  1. CAD s/p anterior STEMI in 01/2013: On lifelong to antiplatelet therapy. Denies any exertional symptom recently. Continue on aspirin, Lipitor, carvedilol, Plavix, spironolactone and lisinopril. Initially did have ischemic heart biopsy with EF down to 35%, however  in March 2015, roughly 2 months after initial MI, echo shows EF has improved back to normal range. On lifelong antiplatelet therapy due to location of the stent. Does have occluded mid RCA on the previous cath and was managed medically.  2. HTN: Blood pressure well controlled on lisinopril, spironolactone and carvedilol.  3. HLD: Recent fasting lipid panel shows a jump in the triglyceride level, discussing with the patient, and it was noted he has not been taking Lipitor regularly. I have instructed him to restart on the Lipitor and recheck his fasting lipid panel in 3 month.    Medication Adjustments/Labs and Tests Ordered: Current medicines are reviewed at length with the patient today.  Concerns regarding medicines are outlined above.  Medication changes, Labs and Tests ordered today are listed in the Patient Instructions below. Patient Instructions  Medication Instructions:  Continue current medications  Labwork: 3 Months- Fasting Lipid Liver  Testing/Procedures: None Ordered  Follow-Up: Your physician wants you to follow-up in: 6 Months with Dr SwazilandJordan You will receive a reminder letter in the mail two months in advance. If you don't receive a letter, please call our office to schedule the follow-up appointment.   Any Other Special Instructions Will Be Listed Below (If Applicable).   If you need a refill on your cardiac medications before your next appointment,  please call your pharmacy.      Ramond DialSigned, Dillyn Menna, [place]  10/08/2015 3:24 PM    [facility] [address], [place], [place]  [address] Phone: [phone]; Fax: [phone]

## 2015-10-08 NOTE — Patient Instructions (Signed)
Medication Instructions:  Continue current medications  Labwork: 3 Months- Fasting Lipid Liver  Testing/Procedures: None Ordered  Follow-Up: Your physician wants you to follow-up in: 6 Months with Dr Swaziland You will receive a reminder letter in the mail two months in advance. If you don't receive a letter, please call our office to schedule the follow-up appointment.   Any Other Special Instructions Will Be Listed Below (If Applicable).   If you need a refill on your cardiac medications before your next appointment, please call your pharmacy.

## 2015-11-02 MED FILL — CLOPIDOGREL 75 MG TABLET: 75 | 30 days supply | Qty: 30 | Fill #4

## 2015-11-02 MED FILL — CARVEDILOL 6.25 MG TABLET: 6.25 | 30 days supply | Qty: 60 | Fill #0

## 2015-11-02 MED FILL — LISINOPRIL 20 MG TABLET: 20 | 30 days supply | Qty: 30 | Fill #6

## 2015-11-02 MED FILL — SPIRONOLACTONE 25 MG TABLET: 25 | 30 days supply | Qty: 15 | Fill #5

## 2015-11-02 MED FILL — ATORVASTATIN 80 MG TABLET: 80 | 30 days supply | Qty: 30 | Fill #4

## 2015-12-04 ENCOUNTER — Other Ambulatory Visit: Payer: Self-pay | Admitting: Internal Medicine

## 2015-12-04 MED FILL — CLOPIDOGREL 75 MG TABLET: 75 | 30 days supply | Qty: 30 | Fill #5

## 2015-12-04 MED FILL — SPIRONOLACTONE 25 MG TABLET: 25 | 30 days supply | Qty: 15 | Fill #6

## 2015-12-04 MED FILL — ATORVASTATIN 80 MG TABLET: 80 | 30 days supply | Qty: 30 | Fill #0

## 2015-12-04 MED FILL — CARVEDILOL 6.25 MG TABLET: 6.25 | 30 days supply | Qty: 60 | Fill #1

## 2015-12-04 MED FILL — LISINOPRIL 20 MG TABLET: 20 | 30 days supply | Qty: 30 | Fill #7

## 2016-01-02 MED FILL — LISINOPRIL 20 MG TABLET: 20 | 30 days supply | Qty: 30 | Fill #8

## 2016-01-02 MED FILL — ATORVASTATIN 80 MG TABLET: 80 | 30 days supply | Qty: 30 | Fill #1

## 2016-01-02 MED FILL — CARVEDILOL 6.25 MG TABLET: 6.25 | 30 days supply | Qty: 60 | Fill #2

## 2016-01-02 MED FILL — CLOPIDOGREL 75 MG TABLET: 75 | 30 days supply | Qty: 30 | Fill #6

## 2016-01-02 MED FILL — SPIRONOLACTONE 25 MG TABLET: 25 | 30 days supply | Qty: 15 | Fill #7

## 2016-01-03 ENCOUNTER — Ambulatory Visit: Payer: Medicaid Other | Attending: Internal Medicine

## 2016-01-03 DIAGNOSIS — E785 Hyperlipidemia, unspecified: Secondary | ICD-10-CM | POA: Insufficient documentation

## 2016-01-03 LAB — HEPATIC FUNCTION PANEL
ALBUMIN: 3.9 g/dL (ref 3.6–5.1)
ALK PHOS: 89 U/L (ref 40–115)
ALT: 17 U/L (ref 9–46)
AST: 20 U/L (ref 10–35)
BILIRUBIN DIRECT: 0.1 mg/dL (ref <=0.2)
BILIRUBIN TOTAL: 0.6 mg/dL (ref 0.2–1.2)
Indirect Bilirubin: 0.5 mg/dL (ref 0.2–1.2)
Total Protein: 7.6 g/dL (ref 6.1–8.1)

## 2016-01-03 LAB — LIPID PANEL
CHOL/HDL RATIO: 5.8 ratio — AB (ref <5.0)
Cholesterol: 173 mg/dL (ref <200)
HDL: 30 mg/dL — ABNORMAL LOW (ref >40)
LDL CALC: 108 mg/dL — AB (ref <100)
Triglycerides: 175 mg/dL — ABNORMAL HIGH (ref <150)
VLDL: 35 mg/dL — ABNORMAL HIGH (ref <30)

## 2016-01-03 NOTE — Progress Notes (Signed)
Pt here for lab visit only 

## 2016-01-11 ENCOUNTER — Telehealth: Payer: Self-pay | Admitting: *Deleted

## 2016-01-11 NOTE — Telephone Encounter (Signed)
Patient verified DOB Patient is aware of liver function being normal but cholesterol being high. Patient is advised to continue with diet changes and atorvastatin. Patient expressed his understanding and had no further questions at this time.

## 2016-01-11 NOTE — Telephone Encounter (Signed)
-----   Message from Quentin Angst, MD sent at 01/11/2016 10:29 AM EST ----- Please inform patient that his liver function is normal, cholesterol level still high, continue cholesterol medication and please limit saturated fat to no more than 7% of your calories, limit cholesterol to 200 mg/day, increase fiber and exercise as tolerated.

## 2016-02-07 MED FILL — CLOPIDOGREL 75 MG TABLET: 75 | 30 days supply | Qty: 30 | Fill #7

## 2016-02-07 MED FILL — SPIRONOLACTONE 25 MG TABLET: 25 | 30 days supply | Qty: 15 | Fill #8

## 2016-02-07 MED FILL — LISINOPRIL 20 MG TABLET: 20 | 30 days supply | Qty: 30 | Fill #9

## 2016-02-07 MED FILL — CARVEDILOL 6.25 MG TABLET: 6.25 | 30 days supply | Qty: 60 | Fill #3

## 2016-02-07 MED FILL — ATORVASTATIN 80 MG TABLET: 80 | 30 days supply | Qty: 30 | Fill #2

## 2016-03-04 MED FILL — CARVEDILOL 6.25 MG TABLET: 6.25 | 30 days supply | Qty: 60 | Fill #4

## 2016-03-04 MED FILL — LISINOPRIL 20 MG TABLET: 20 | 30 days supply | Qty: 30 | Fill #10

## 2016-03-04 MED FILL — ATORVASTATIN 80 MG TABLET: 80 | 30 days supply | Qty: 30 | Fill #3

## 2016-03-04 MED FILL — SPIRONOLACTONE 25 MG TABLET: 25 | 30 days supply | Qty: 15 | Fill #2

## 2016-03-04 MED FILL — CLOPIDOGREL 75 MG TABLET: 75 | 30 days supply | Qty: 30 | Fill #8

## 2016-03-13 ENCOUNTER — Encounter: Payer: Self-pay | Admitting: Cardiology

## 2016-03-16 NOTE — Progress Notes (Signed)
[name] Date of Birth: 1960/12/30 Medical Record #[id number]  History of Present Illness: [name] is seen for follow up CAD. He is s/p anterior STEMI on 02/10/13 treated with DES x 2 of the LAD. ( 3.0x 38 mm Promus and 3.5 x 32 Promus) He has a history of HTN and hyperlipidemia. He is a former smoker. On emergent cardiac cath he had occlusion of the proximal LAD. The RCA was also occluded in the mid vessel but this appeared chronic and was collateralized. EF was 35-40%. In November 2015 he was admitted with an UGI bleed. He was found to have a gastritis/duodenitis and duodenal ulcer. He was treated with PPI and Hgb later normalized.  On follow up today he is feeling very well. No chest pain or SOB. No edema or palpitations. He  works as a Community education officer. He walks daily. States he wasn't taking his lipitor every day but is now.  Current Outpatient Prescriptions on File Prior to Visit  Medication Sig Dispense Refill  . aspirin EC 81 MG tablet Take 81 mg by mouth daily.    Marland Kitchen atorvastatin (LIPITOR) 80 MG tablet TAKE 1 TABLET BY MOUTH DAILY AT 6PM 30 tablet 4  . carvedilol (COREG) 6.25 MG tablet Take 1 tablet (6.25 mg total) by mouth 2 (two) times daily with a meal. 180 tablet 2  . clopidogrel (PLAVIX) 75 MG tablet TAKE 1 TABLET BY MOUTH DAILY. 90 tablet 3  . lisinopril (PRINIVIL,ZESTRIL) 20 MG tablet TAKE 1 TABLET BY MOUTH DAILY. 90 tablet 3  . nitroGLYCERIN (NITROSTAT) 0.4 MG SL tablet Place 1 tablet (0.4 mg total) under the tongue every 5 (five) minutes as needed for chest pain. 25 tablet 12  . spironolactone (ALDACTONE) 25 MG tablet Take 0.5 tablets (12.5 mg total) by mouth daily. 45 tablet 2   No current facility-administered medications on file prior to visit.     No Known Allergies  Past Medical History:  Diagnosis Date  . Anemia    a. 11/2013 in setting of ulcer/gastritis.  Marland Kitchen CAD (coronary artery disease)    a. Ant STEMI (01/2013):  LHC - dLM 20, pLAD 100 (DES x 2), CFX < 20,  mRCA 100% (L-R collats)  . Chronic systolic CHF (congestive heart failure) (HCC) 10/13/2014  . Duodenal ulcer    a. 11/2013 EGD: medium sized, non-bleeding ulcer @ duodenal bulb.  . Duodenitis without hemorrhage    a. 11/2013 EGD: erosive duodenitis w/o bleeding.  . Erosive gastritis    a. 11/2013 EGD: prepyloric region of stomach and gastric antrum.  Marland Kitchen HLD (hyperlipidemia)   . Hypertension   . Ischemic cardiomyopathy    a. echo (01/2013):  mild LVH, EF 35-40%, AS and apical AK, PASP 34    Past Surgical History:  Procedure Laterality Date  . CORONARY ANGIOGRAM  02/10/2013   Procedure: CORONARY ANGIOGRAM;  Surgeon: Janny Crute M [place], MD;  Location: [facility] CATH LAB;  Service: Cardiovascular;;  . CORONARY ANGIOPLASTY WITH STENT PLACEMENT  01/2013  . ESOPHAGOGASTRODUODENOSCOPY N/A 11/16/2013   Procedure: ESOPHAGOGASTRODUODENOSCOPY (EGD);  Surgeon: Beverley Fiedler, MD;  Location: [facility] ENDOSCOPY;  Service: Endoscopy;  Laterality: N/A;  . PERCUTANEOUS CORONARY STENT INTERVENTION (PCI-S)  02/10/2013   Procedure: PERCUTANEOUS CORONARY STENT INTERVENTION (PCI-S);  Surgeon: Chana Lindstrom M [place], MD;  Location: [facility] CATH LAB;  Service: Cardiovascular;;  DES x2 Prox LAD to MID LAD  . PILONIDAL CYST EXCISION  ~ 1995    History  Smoking Status  . Former Smoker  .  Packs/day: 0.50  . Years: 34.00  . Types: Cigarettes  . Quit date: 04/13/2012  Smokeless Tobacco  . Never Used    History  Alcohol Use No    Family History  Problem Relation Age of Onset  . Family history unknown: Yes    Review of Systems: As noted in HPI.  All other systems were reviewed and are negative.  Physical Exam: BP 120/70   Pulse 92   Ht 5\' 7"  (1.702 m)   Wt 207 lb 3.2 oz (94 kg)   BMI 32.45 kg/m  [name] male in NAD HEENT: [place]/AT, PERRL, EOMI, sclera are clear. Oropharynx is clear.  Neck: no JVD, bruits, adenopathy Lungs: clear CV: RRR, normal S1-2, No murmur or gallop Abdomen: soft, NT, BS+, no masses or bruits. Ext:  no edema.  Pulses 2+ Neuro: alert, oriented x 3. Nonfocal.  LABORATORY DATA: Lab Results  Component Value Date   WBC 8.7 07/13/2014   HGB 14.1 07/13/2014   HCT 42.3 07/13/2014   PLT 214 07/13/2014   GLUCOSE 125 (H) 08/30/2015   CHOL 173 01/03/2016   TRIG 175 (H) 01/03/2016   HDL 30 (L) 01/03/2016   LDLCALC 108 (H) 01/03/2016   ALT 17 01/03/2016   AST 20 01/03/2016   NA 135 08/30/2015   K 4.2 08/30/2015   CL 103 08/30/2015   CREATININE 0.91 08/30/2015   BUN 14 08/30/2015   CO2 20 08/30/2015   TSH 0.847 07/13/2014   INR 1.26 11/14/2013   HGBA1C 5.60 07/13/2014   Echo 04/04/13: Study Conclusions  - Left ventricle: There is mid and basal anterior, mid anteroseptal and distal septal wall hypokinesis. Overal LVEF is preserved. The cavity size was normal. Systolic function was normal. The estimated ejection fraction was in the range of 55% to 60%. Doppler parameters are consistent with abnormal left ventricular relaxation (grade 1 diastolic dysfunction). There was no evidence of elevated ventricular filling pressure by Doppler parameters. - Aortic valve: Trileaflet; mildly thickened, mildly calcified leaflets. Transvalvular velocity was within the normal range. There was no stenosis. No regurgitation. - Aortic root: The aortic root was normal in size. - Mitral valve: No regurgitation. - Right ventricle: Systolic function was normal. - Pulmonic valve: Trivial regurgitation. - Pulmonary arteries: Systolic pressure was within the normal range. - Pericardium, extracardiac: There was no pericardial effusion. Impressions:  - Compared to the prior study LVEF has improved and is now normal.  Assessment / Plan:   1. CAD s/p anterior MI with stenting of the LAD with DES x 2 in January 2015. Given extensive stenting in the LAD I would prefer long term DAPT.  Will treat chronic total occlusion of RCA medically.  He is asymptomatic now. Recommend continued  walking program. Try and lose weight and follow a heart healthy diet.  I will follow up in 6-8 months.  2. Ischemic cardiomyopathy. EF 35-40% initially with MI. EF improved to normal 2 months later. Continue Coreg, lisinopril, aldactone.   3. HTN controlled.  4. Hyperlipidemia on high dose statin. Encourage him to take daily. Not a candidate for PCSK 9 inhibitor due to cost and he is on Medicaid.  5. History of gastritis/duondenitis and duodenal ulcer. Resolved.

## 2016-03-19 ENCOUNTER — Other Ambulatory Visit: Payer: Self-pay

## 2016-03-19 ENCOUNTER — Encounter: Payer: Self-pay | Admitting: Cardiology

## 2016-03-19 ENCOUNTER — Ambulatory Visit (INDEPENDENT_AMBULATORY_CARE_PROVIDER_SITE_OTHER): Payer: Medicaid Other | Admitting: Cardiology

## 2016-03-19 VITALS — BP 120/70 | HR 92 | Ht 67.0 in | Wt 207.2 lb

## 2016-03-19 DIAGNOSIS — E785 Hyperlipidemia, unspecified: Secondary | ICD-10-CM | POA: Diagnosis not present

## 2016-03-19 DIAGNOSIS — I2510 Atherosclerotic heart disease of native coronary artery without angina pectoris: Secondary | ICD-10-CM

## 2016-03-19 DIAGNOSIS — I10 Essential (primary) hypertension: Secondary | ICD-10-CM | POA: Diagnosis not present

## 2016-03-19 DIAGNOSIS — I255 Ischemic cardiomyopathy: Secondary | ICD-10-CM

## 2016-03-19 MED ORDER — NITROGLYCERIN 0.4 MG SL SUBL: 0.4000 mg | SUBLINGUAL_TABLET | SUBLINGUAL | 11 refills | Status: DC | PRN

## 2016-03-19 NOTE — Patient Instructions (Signed)
Continue your current therapy  I will see you in 6 months.   

## 2016-03-25 IMAGING — CR DG SHOULDER 2+V*R*
3 series · 3 of 3 positions shown · non-contrast
Comparison: None.

CLINICAL DATA: Sharp right shoulder pain.

EXAM:
RIGHT SHOULDER - 2+ VIEW

[w shoulder external right]
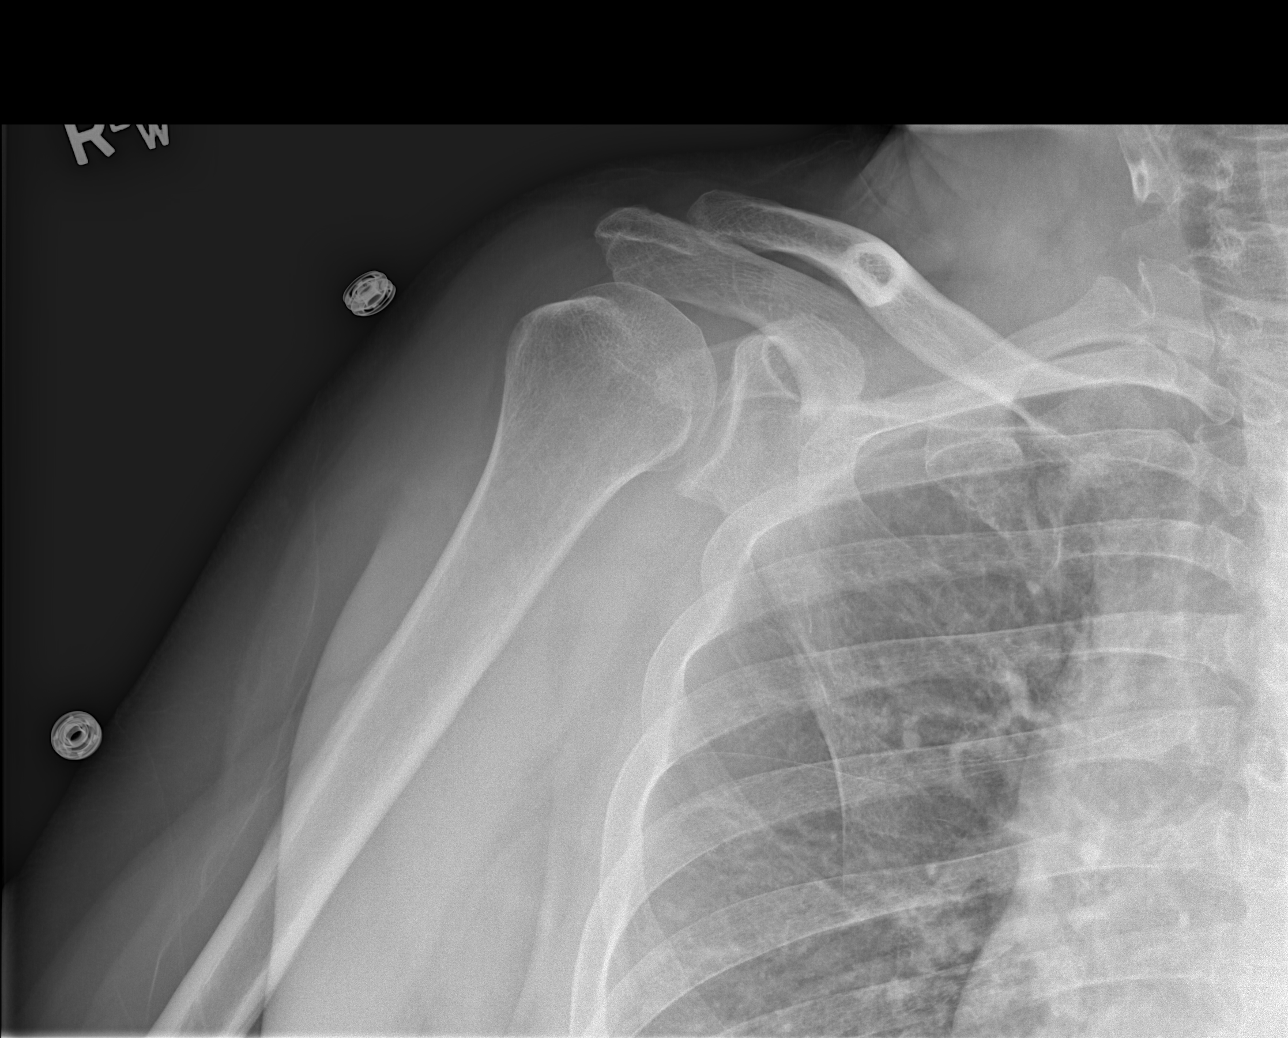

[w shoulder y-view right]
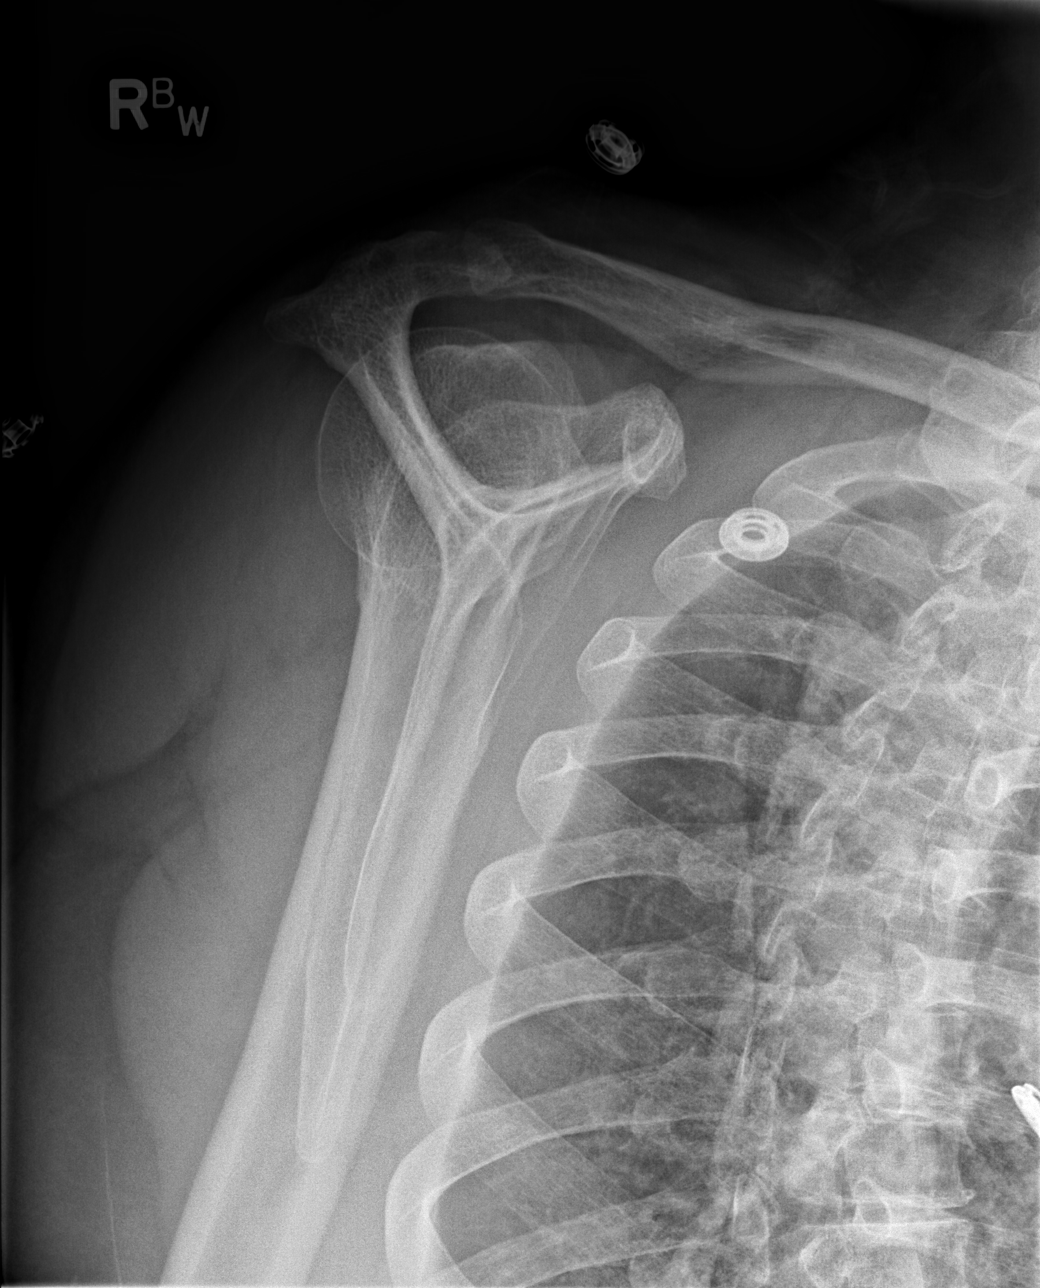

[x shoulder ap right]
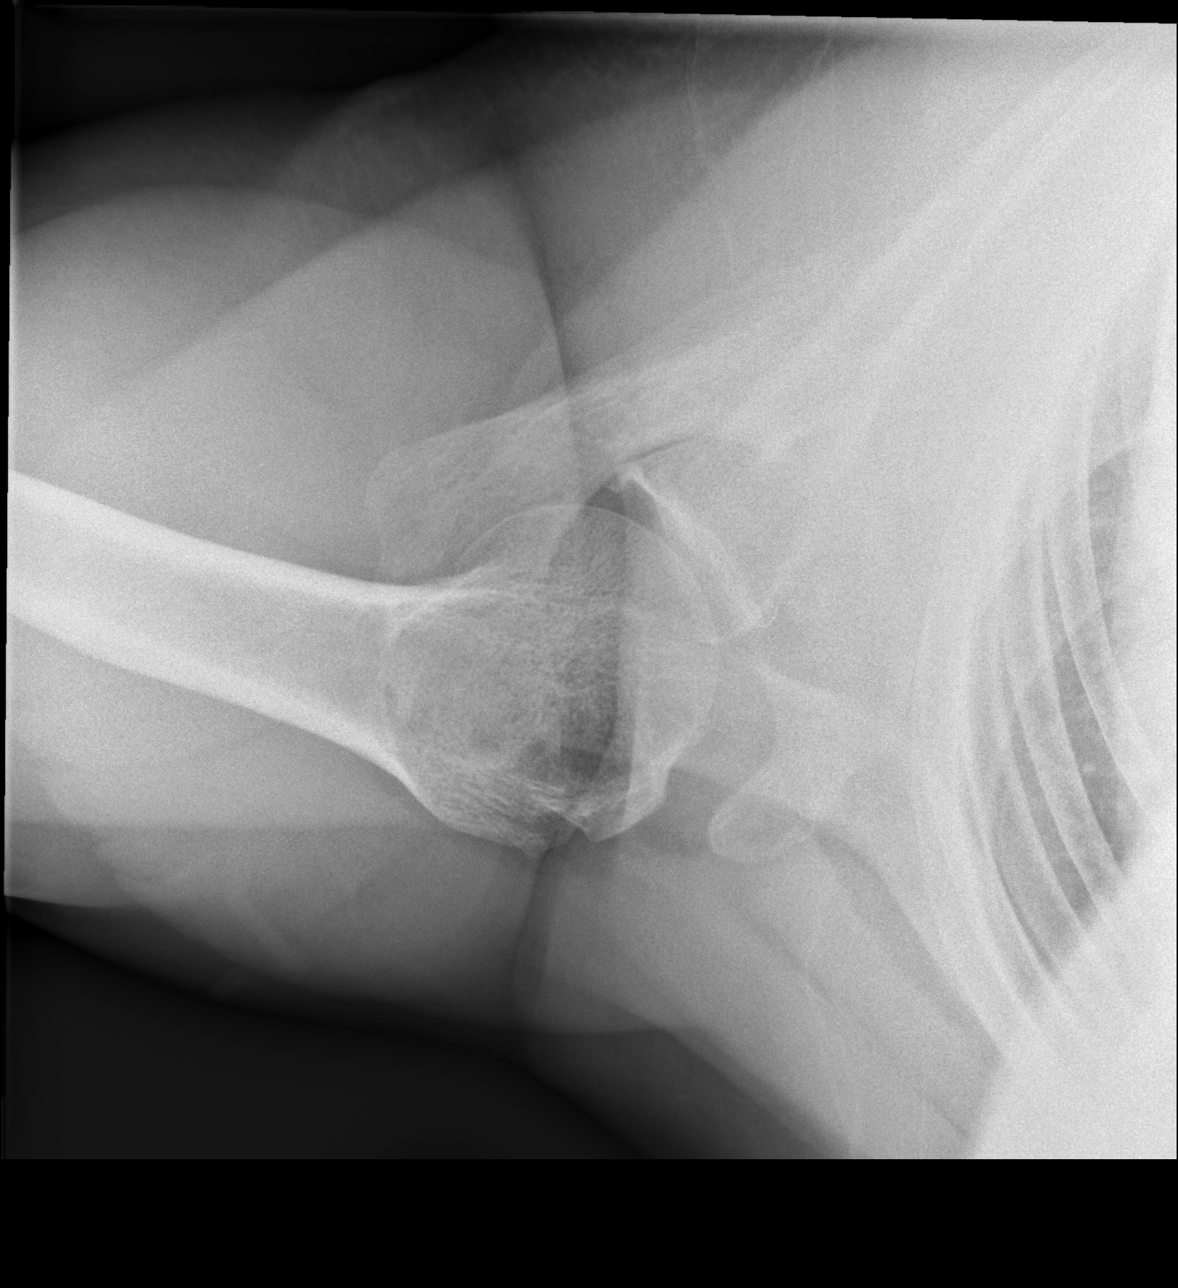

[3 of 3 positions shown; findings below may reference images not displayed]

FINDINGS: There is no evidence of fracture or dislocation. There is no
evidence of arthropathy or other focal bone abnormality. Soft
tissues are unremarkable.
IMPRESSION: Negative.

## 2016-04-01 MED FILL — CLOPIDOGREL 75 MG TABLET: 75 | 30 days supply | Qty: 30 | Fill #9

## 2016-04-01 MED FILL — SPIRONOLACTONE 25 MG TABLET: 25 | 30 days supply | Qty: 15 | Fill #3

## 2016-04-01 MED FILL — CARVEDILOL 6.25 MG TABLET: 6.25 | 30 days supply | Qty: 60 | Fill #5

## 2016-04-01 MED FILL — LISINOPRIL 20 MG TABLET: 20 | 30 days supply | Qty: 30 | Fill #11

## 2016-04-01 MED FILL — ATORVASTATIN 80 MG TABLET: 80 | 30 days supply | Qty: 30 | Fill #4

## 2016-04-14 MED FILL — NITROSTAT 0.4 MG TABLET SL: 0.4 | 25 days supply | Qty: 25 | Fill #0

## 2016-05-02 ENCOUNTER — Other Ambulatory Visit: Payer: Self-pay | Admitting: Internal Medicine

## 2016-05-02 MED FILL — CARVEDILOL 6.25 MG TABLET: 6.25 | 30 days supply | Qty: 60 | Fill #6

## 2016-05-02 MED FILL — SPIRONOLACTONE 25 MG TABLET: 25 | 30 days supply | Qty: 15 | Fill #4

## 2016-05-02 MED FILL — ATORVASTATIN 80 MG TABLET: 80 | 30 days supply | Qty: 30 | Fill #0

## 2016-05-02 MED FILL — LISINOPRIL 20 MG TABLET: 20 | 30 days supply | Qty: 30 | Fill #12

## 2016-05-02 MED FILL — CLOPIDOGREL 75 MG TABLET: 75 | 30 days supply | Qty: 30 | Fill #10

## 2016-06-03 ENCOUNTER — Other Ambulatory Visit: Payer: Self-pay | Admitting: Cardiology

## 2016-06-03 MED FILL — CLOPIDOGREL 75 MG TABLET: 75 | 30 days supply | Qty: 30 | Fill #11

## 2016-06-03 MED FILL — ATORVASTATIN 80 MG TABLET: 80 | 30 days supply | Qty: 30 | Fill #1

## 2016-06-03 MED FILL — SPIRONOLACTONE 25 MG TABLET: 25 | 30 days supply | Qty: 15 | Fill #5

## 2016-06-03 NOTE — Telephone Encounter (Signed)
Rx request sent to pharmacy.  

## 2016-06-04 MED FILL — LISINOPRIL 20 MG TABLET: 20 | 30 days supply | Qty: 30 | Fill #0

## 2016-06-04 MED FILL — CARVEDILOL 6.25 MG TABLET: 6.25 | 30 days supply | Qty: 60 | Fill #0

## 2016-07-02 ENCOUNTER — Other Ambulatory Visit: Payer: Self-pay | Admitting: Cardiology

## 2016-07-02 MED FILL — LISINOPRIL 20 MG TABLET: 20 | 30 days supply | Qty: 30 | Fill #1

## 2016-07-02 MED FILL — ATORVASTATIN 80 MG TABLET: 80 | 30 days supply | Qty: 30 | Fill #2

## 2016-07-02 MED FILL — CARVEDILOL 6.25 MG TABLET: 6.25 | 30 days supply | Qty: 60 | Fill #1

## 2016-07-02 MED FILL — SPIRONOLACTONE 25 MG TABLET: 25 | 30 days supply | Qty: 15 | Fill #6

## 2016-07-03 MED FILL — CLOPIDOGREL 75 MG TABLET: 75 | 30 days supply | Qty: 30 | Fill #0

## 2016-08-01 MED FILL — SPIRONOLACTONE 25 MG TABLET: 25 | 30 days supply | Qty: 15 | Fill #7

## 2016-08-01 MED FILL — ATORVASTATIN 80 MG TABLET: 80 | 30 days supply | Qty: 30 | Fill #3

## 2016-08-01 MED FILL — CARVEDILOL 6.25 MG TABLET: 6.25 | 30 days supply | Qty: 60 | Fill #2

## 2016-08-01 MED FILL — LISINOPRIL 20 MG TAB: 20 | 30 days supply | Qty: 30 | Fill #2

## 2016-08-01 MED FILL — CLOPIDOGREL 75 MG TABLET: 75 | 30 days supply | Qty: 30 | Fill #1

## 2016-08-13 ENCOUNTER — Telehealth: Payer: Self-pay | Admitting: Cardiology

## 2016-08-13 NOTE — Telephone Encounter (Signed)
[place] of the [place] and needs an appt.before the end of Sept.   His mother is in critical condition.   Please give him a call back.   I offered PA  Only wants Dr [place]

## 2016-08-19 NOTE — Telephone Encounter (Signed)
Returned call to patient 08/14/16.Sympathy expressed for his mother.Appointment scheduled with Dr.Jordan 09/09/16 at 10:20 am.

## 2016-09-03 ENCOUNTER — Other Ambulatory Visit: Payer: Self-pay | Admitting: Internal Medicine

## 2016-09-03 MED FILL — ATORVASTATIN 80 MG TABLET: 80 | 30 days supply | Qty: 30 | Fill #4

## 2016-09-03 MED FILL — LISINOPRIL 20 MG TAB: 20 | 30 days supply | Qty: 30 | Fill #3

## 2016-09-03 MED FILL — CARVEDILOL 6.25 MG TABLET: 6.25 | 30 days supply | Qty: 60 | Fill #3

## 2016-09-03 MED FILL — CLOPIDOGREL 75 MG TABLET: 75 | 30 days supply | Qty: 30 | Fill #2

## 2016-09-04 MED FILL — SPIRONOLACTONE 25 MG TABLET: 25 | 90 days supply | Qty: 45 | Fill #0

## 2016-09-08 NOTE — Progress Notes (Signed)
[name] Date of Birth: [birth year] Medical Record #[id number]  History of Present Illness: [name] is seen for follow up CAD. He is s/p anterior STEMI on 02/10/13 treated with DES x 2 of the LAD. ( 3.0x 38 mm Promus and 3.5 x 32 Promus) He has a history of HTN and hyperlipidemia. He is a former smoker. On emergent cardiac cath he had occlusion of the proximal LAD. The RCA was also occluded in the mid vessel but this appeared chronic and was collateralized. EF was 35-40%. In November 2015 he was admitted with an UGI bleed. He was found to have a gastritis/duodenitis and duodenal ulcer. He was treated with PPI and Hgb later normalized.  On follow up today he is feeling  well. No chest pain or SOB. No edema or palpitations. He had one episode 6 weeks ago when he felt very weak and had a hard time standing. Admits that he doesn't always take his medication as he should. He has gained wait and is more sedentary.  Current Outpatient Prescriptions on File Prior to Visit  Medication Sig Dispense Refill  . aspirin EC 81 MG tablet Take 81 mg by mouth daily.    Marland Kitchen atorvastatin (LIPITOR) 80 MG tablet TAKE 1 TABLET BY MOUTH DAILY AT 6PM 30 tablet 4  . carvedilol (COREG) 6.25 MG tablet TAKE 1 TABLET BY MOUTH 2 TIMES DAILY WITH A MEAL. 180 tablet 2  . clopidogrel (PLAVIX) 75 MG tablet TAKE 1 TABLET BY MOUTH DAILY. 90 tablet 1  . lisinopril (PRINIVIL,ZESTRIL) 20 MG tablet TAKE 1 TABLET BY MOUTH DAILY. 90 tablet 3  . spironolactone (ALDACTONE) 25 MG tablet TAKE 1/2 TABLET BY MOUTH ONCE DAILY 45 tablet 2   No current facility-administered medications on file prior to visit.     No Known Allergies  Past Medical History:  Diagnosis Date  . Anemia    a. 11/2013 in setting of ulcer/gastritis.  Marland Kitchen CAD (coronary artery disease)    a. Ant STEMI (01/2013):  LHC - dLM 20, pLAD 100 (DES x 2), CFX < 20, mRCA 100% (L-R collats)  . Chronic systolic CHF (congestive heart failure) (HCC) 10/13/2014  . Duodenal  ulcer    a. 11/2013 EGD: medium sized, non-bleeding ulcer @ duodenal bulb.  . Duodenitis without hemorrhage    a. 11/2013 EGD: erosive duodenitis w/o bleeding.  . Erosive gastritis    a. 11/2013 EGD: prepyloric region of stomach and gastric antrum.  Marland Kitchen HLD (hyperlipidemia)   . Hypertension   . Ischemic cardiomyopathy    a. echo (01/2013):  mild LVH, EF 35-40%, AS and apical AK, PASP 34    Past Surgical History:  Procedure Laterality Date  . CORONARY ANGIOGRAM  02/10/2013   Procedure: CORONARY ANGIOGRAM;  Surgeon: Jason M [place], MD;  Location: [facility] CATH LAB;  Service: Cardiovascular;;  . CORONARY ANGIOPLASTY WITH STENT PLACEMENT  01/2013  . ESOPHAGOGASTRODUODENOSCOPY N/A 11/16/2013   Procedure: ESOPHAGOGASTRODUODENOSCOPY (EGD);  Surgeon: Jason Fiedler, MD;  Location: [facility] ENDOSCOPY;  Service: Endoscopy;  Laterality: N/A;  . PERCUTANEOUS CORONARY STENT INTERVENTION (PCI-S)  02/10/2013   Procedure: PERCUTANEOUS CORONARY STENT INTERVENTION (PCI-S);  Surgeon: Jason M [place], MD;  Location: [facility] CATH LAB;  Service: Cardiovascular;;  DES x2 Prox LAD to MID LAD  . PILONIDAL CYST EXCISION  ~ 1995    History  Smoking Status  . Former Smoker  . Packs/day: 0.50  . Years: 34.00  . Types: Cigarettes  . Quit date: 04/13/2012  Smokeless Tobacco  .  Never Used    History  Alcohol Use No    Family History  Problem Relation Age of Onset  . Family history unknown: Yes    Review of Systems: As noted in HPI.  All other systems were reviewed and are negative.  Physical Exam: BP 120/78   Pulse 92   Ht 5\' 7"  (1.702 m)   Wt 212 lb (96.2 kg)   BMI 33.20 kg/m  [name] male in NAD HEENT: [place]/AT, PERRL, EOMI, sclera are clear. Oropharynx is clear.  Neck: no JVD, bruits, adenopathy Lungs: clear CV: RRR, normal S1-2, No murmur or gallop Abdomen: soft, NT, BS+, no masses or bruits. Ext: no edema.  Pulses 2+ Neuro: alert, oriented x 3. Nonfocal.  LABORATORY DATA: Lab Results  Component Value  Date   WBC 8.7 07/13/2014   HGB 14.1 07/13/2014   HCT 42.3 07/13/2014   PLT 214 07/13/2014   GLUCOSE 125 (H) 08/30/2015   CHOL 173 01/03/2016   TRIG 175 (H) 01/03/2016   HDL 30 (L) 01/03/2016   LDLCALC 108 (H) 01/03/2016   ALT 17 01/03/2016   AST 20 01/03/2016   NA 135 08/30/2015   K 4.2 08/30/2015   CL 103 08/30/2015   CREATININE 0.91 08/30/2015   BUN 14 08/30/2015   CO2 20 08/30/2015   TSH 0.847 07/13/2014   INR 1.26 11/14/2013   HGBA1C 5.60 07/13/2014    Ecg today shows NSR with PVCs, old anterior infarct. Old inferior infarct. No acute change. I have personally reviewed and interpreted this study.  Echo 04/04/13: Study Conclusions  - Left ventricle: There is mid and basal anterior, mid anteroseptal and distal septal wall hypokinesis. Overal LVEF is preserved. The cavity size was normal. Systolic function was normal. The estimated ejection fraction was in the range of 55% to 60%. Doppler parameters are consistent with abnormal left ventricular relaxation (grade 1 diastolic dysfunction). There was no evidence of elevated ventricular filling pressure by Doppler parameters. - Aortic valve: Trileaflet; mildly thickened, mildly calcified leaflets. Transvalvular velocity was within the normal range. There was no stenosis. No regurgitation. - Aortic root: The aortic root was normal in size. - Mitral valve: No regurgitation. - Right ventricle: Systolic function was normal. - Pulmonic valve: Trivial regurgitation. - Pulmonary arteries: Systolic pressure was within the normal range. - Pericardium, extracardiac: There was no pericardial effusion. Impressions:  - Compared to the prior study LVEF has improved and is now normal.  Assessment / Plan:   1. CAD s/p anterior MI with stenting of the LAD with DES x 2 in January 2015. Given extensive stenting in the LAD I would prefer long term DAPT.  Will treat chronic total occlusion of RCA medically.  He  is asymptomatic now. Recommend increasing his aerobic activity and lose weight. Stressed the importance of compliance with his medical therapy daily.  Follow up in 6 months with fasting labs.   2. Ischemic cardiomyopathy. EF 35-40% initially with MI. EF improved to normal 2 months later. Continue Coreg, lisinopril, aldactone.   3. HTN controlled.  4. Hyperlipidemia on high dose statin. Encourage him to take daily. Not a candidate for PCSK 9 inhibitor due to cost and he is on Medicaid. Will repeat fasting lab next visit.

## 2016-09-09 ENCOUNTER — Encounter: Payer: Self-pay | Admitting: Cardiology

## 2016-09-09 ENCOUNTER — Ambulatory Visit (INDEPENDENT_AMBULATORY_CARE_PROVIDER_SITE_OTHER): Payer: Medicaid Other | Admitting: Cardiology

## 2016-09-09 VITALS — BP 120/78 | HR 92 | Ht 67.0 in | Wt 212.0 lb

## 2016-09-09 DIAGNOSIS — I5022 Chronic systolic (congestive) heart failure: Secondary | ICD-10-CM

## 2016-09-09 DIAGNOSIS — I10 Essential (primary) hypertension: Secondary | ICD-10-CM | POA: Diagnosis not present

## 2016-09-09 DIAGNOSIS — I2510 Atherosclerotic heart disease of native coronary artery without angina pectoris: Secondary | ICD-10-CM | POA: Diagnosis not present

## 2016-09-09 DIAGNOSIS — E785 Hyperlipidemia, unspecified: Secondary | ICD-10-CM | POA: Diagnosis not present

## 2016-09-09 MED ORDER — NITROGLYCERIN 0.4 MG SL SUBL: 0.4000 mg | SUBLINGUAL_TABLET | SUBLINGUAL | 11 refills | Status: DC | PRN

## 2016-09-09 MED FILL — NITROSTAT 0.4 MG TABLET SL: 0.4 | 25 days supply | Qty: 25 | Fill #0

## 2016-09-09 NOTE — Patient Instructions (Signed)
Continue your medication. Take daily as prescribed  You need to focus on losing weight and daily exercise  I will see you in 6 months with fasting lab work

## 2016-10-01 ENCOUNTER — Other Ambulatory Visit: Payer: Self-pay | Admitting: Internal Medicine

## 2016-10-01 MED FILL — CLOPIDOGREL 75 MG TABLET: 75 | 30 days supply | Qty: 30 | Fill #3

## 2016-10-01 MED FILL — ATORVASTATIN 80 MG TABLET: 80 | 30 days supply | Qty: 30 | Fill #0

## 2016-10-01 MED FILL — CARVEDILOL 6.25 MG TABLET: 6.25 | 30 days supply | Qty: 60 | Fill #4

## 2016-10-01 MED FILL — LISINOPRIL 20 MG TAB: 20 | 30 days supply | Qty: 30 | Fill #4

## 2016-11-03 ENCOUNTER — Other Ambulatory Visit: Payer: Self-pay | Admitting: Internal Medicine

## 2016-11-03 MED FILL — LISINOPRIL 20 MG TAB: 20 | 30 days supply | Qty: 30 | Fill #5

## 2016-11-03 MED FILL — CLOPIDOGREL 75 MG TABLET: 75 | 30 days supply | Qty: 30 | Fill #4

## 2016-11-05 ENCOUNTER — Other Ambulatory Visit: Payer: Self-pay | Admitting: Internal Medicine

## 2016-12-03 ENCOUNTER — Ambulatory Visit: Payer: Medicaid Other | Attending: Internal Medicine | Admitting: Internal Medicine

## 2016-12-03 ENCOUNTER — Encounter: Payer: Self-pay | Admitting: Internal Medicine

## 2016-12-03 DIAGNOSIS — I5022 Chronic systolic (congestive) heart failure: Secondary | ICD-10-CM | POA: Diagnosis not present

## 2016-12-03 DIAGNOSIS — Z7902 Long term (current) use of antithrombotics/antiplatelets: Secondary | ICD-10-CM | POA: Insufficient documentation

## 2016-12-03 DIAGNOSIS — I429 Cardiomyopathy, unspecified: Secondary | ICD-10-CM | POA: Insufficient documentation

## 2016-12-03 DIAGNOSIS — Z9889 Other specified postprocedural states: Secondary | ICD-10-CM | POA: Insufficient documentation

## 2016-12-03 DIAGNOSIS — I255 Ischemic cardiomyopathy: Secondary | ICD-10-CM | POA: Insufficient documentation

## 2016-12-03 DIAGNOSIS — E785 Hyperlipidemia, unspecified: Secondary | ICD-10-CM | POA: Diagnosis not present

## 2016-12-03 DIAGNOSIS — Z7982 Long term (current) use of aspirin: Secondary | ICD-10-CM | POA: Insufficient documentation

## 2016-12-03 DIAGNOSIS — I10 Essential (primary) hypertension: Secondary | ICD-10-CM

## 2016-12-03 DIAGNOSIS — Z6832 Body mass index (BMI) 32.0-32.9, adult: Secondary | ICD-10-CM | POA: Diagnosis not present

## 2016-12-03 DIAGNOSIS — I2510 Atherosclerotic heart disease of native coronary artery without angina pectoris: Secondary | ICD-10-CM | POA: Insufficient documentation

## 2016-12-03 DIAGNOSIS — Z79899 Other long term (current) drug therapy: Secondary | ICD-10-CM | POA: Insufficient documentation

## 2016-12-03 DIAGNOSIS — E669 Obesity, unspecified: Secondary | ICD-10-CM | POA: Diagnosis not present

## 2016-12-03 DIAGNOSIS — I110 Hypertensive heart disease with heart failure: Secondary | ICD-10-CM | POA: Diagnosis present

## 2016-12-03 MED ORDER — CLOPIDOGREL BISULFATE 75 MG PO TABS
75.0000 mg | ORAL_TABLET | Freq: Every day | ORAL | 3 refills | Status: DC
Start: 2016-12-03 — End: 2018-01-27

## 2016-12-03 MED ORDER — SPIRONOLACTONE 25 MG PO TABS: 12.5000 mg | ORAL_TABLET | Freq: Every day | ORAL | 2 refills | Status: DC

## 2016-12-03 MED ORDER — ASPIRIN EC 81 MG PO TBEC: 81.0000 mg | DELAYED_RELEASE_TABLET | Freq: Every day | ORAL | 3 refills | Status: DC

## 2016-12-03 MED ORDER — CARVEDILOL 6.25 MG PO TABS
ORAL_TABLET | ORAL | 2 refills | Status: DC
Start: 2016-12-03 — End: 2018-02-11

## 2016-12-03 MED ORDER — LISINOPRIL 20 MG PO TABS: 20.0000 mg | ORAL_TABLET | Freq: Every day | ORAL | 3 refills | Status: DC

## 2016-12-03 MED ORDER — NITROGLYCERIN 0.4 MG SL SUBL: 0.4000 mg | SUBLINGUAL_TABLET | SUBLINGUAL | 11 refills | Status: DC | PRN

## 2016-12-03 MED ORDER — ATORVASTATIN CALCIUM 80 MG PO TABS: ORAL_TABLET | ORAL | 3 refills | Status: DC

## 2016-12-03 MED FILL — SPIRONOLACTONE 25 MG TABLET: 25 | 90 days supply | Qty: 45 | Fill #0

## 2016-12-03 MED FILL — NITROSTAT 0.4 MG TABLET SL: 0.4 | 30 days supply | Qty: 25 | Fill #0

## 2016-12-03 MED FILL — CARVEDILOL 6.25 MG TABLET: 6.25 | 90 days supply | Qty: 180 | Fill #0

## 2016-12-03 MED FILL — ATORVASTATIN 80 MG TABLET: 80 | 90 days supply | Qty: 90 | Fill #0

## 2016-12-03 MED FILL — CLOPIDOGREL 75 MG TABLET: 75 | 90 days supply | Qty: 90 | Fill #0

## 2016-12-03 MED FILL — LISINOPRIL 20 MG TABLET: 20 | 90 days supply | Qty: 90 | Fill #0

## 2016-12-03 NOTE — Patient Instructions (Signed)
DASH Eating Plan DASH stands for "Dietary Approaches to Stop Hypertension." The DASH eating plan is a healthy eating plan that has been shown to reduce high blood pressure (hypertension). It may also reduce your risk for type 2 diabetes, heart disease, and stroke. The DASH eating plan may also help with weight loss. What are tips for following this plan? General guidelines  Avoid eating more than 2,300 mg (milligrams) of salt (sodium) a day. If you have hypertension, you may need to reduce your sodium intake to 1,500 mg a day.  Limit alcohol intake to no more than 1 drink a day for nonpregnant women and 2 drinks a day for men. One drink equals 12 oz of beer, 5 oz of wine, or 1 oz of hard liquor.  Work with your health care provider to maintain a healthy body weight or to lose weight. Ask what an ideal weight is for you.  Get at least 30 minutes of exercise that causes your heart to beat faster (aerobic exercise) most days of the week. Activities may include walking, swimming, or biking.  Work with your health care provider or diet and nutrition specialist (dietitian) to adjust your eating plan to your individual calorie needs. Reading food labels  Check food labels for the amount of sodium per serving. Choose foods with less than 5 percent of the Daily Value of sodium. Generally, foods with less than 300 mg of sodium per serving fit into this eating plan.  To find whole grains, look for the word "whole" as the first word in the ingredient list. Shopping  Buy products labeled as "low-sodium" or "no salt added."  Buy fresh foods. Avoid canned foods and premade or frozen meals. Cooking  Avoid adding salt when cooking. Use salt-free seasonings or herbs instead of table salt or sea salt. Check with your health care provider or pharmacist before using salt substitutes.  Do not fry foods. Cook foods using healthy methods such as baking, boiling, grilling, and broiling instead.  Cook with  heart-healthy oils, such as olive, canola, soybean, or sunflower oil. Meal planning   Eat a balanced diet that includes: ? 5 or more servings of fruits and vegetables each day. At each meal, try to fill half of your plate with fruits and vegetables. ? Up to 6-8 servings of whole grains each day. ? Less than 6 oz of lean meat, poultry, or fish each day. A 3-oz serving of meat is about the same size as a deck of cards. One egg equals 1 oz. ? 2 servings of low-fat dairy each day. ? A serving of nuts, seeds, or beans 5 times each week. ? Heart-healthy fats. Healthy fats called Omega-3 fatty acids are found in foods such as flaxseeds and coldwater fish, like sardines, salmon, and mackerel.  Limit how much you eat of the following: ? Canned or prepackaged foods. ? Food that is high in trans fat, such as fried foods. ? Food that is high in saturated fat, such as fatty meat. ? Sweets, desserts, sugary drinks, and other foods with added sugar. ? Full-fat dairy products.  Do not salt foods before eating.  Try to eat at least 2 vegetarian meals each week.  Eat more home-cooked food and less restaurant, buffet, and fast food.  When eating at a restaurant, ask that your food be prepared with less salt or no salt, if possible. What foods are recommended? The items listed may not be a complete list. Talk with your dietitian about what   dietary choices are best for you. Grains Whole-grain or whole-wheat bread. Whole-grain or whole-wheat pasta. Brown rice. Oatmeal. Quinoa. Bulgur. Whole-grain and low-sodium cereals. Pita bread. Low-fat, low-sodium crackers. Whole-wheat flour tortillas. Vegetables Fresh or frozen vegetables (raw, steamed, roasted, or grilled). Low-sodium or reduced-sodium tomato and vegetable juice. Low-sodium or reduced-sodium tomato sauce and tomato paste. Low-sodium or reduced-sodium canned vegetables. Fruits All fresh, dried, or frozen fruit. Canned fruit in natural juice (without  added sugar). Meat and other protein foods Skinless chicken or turkey. Ground chicken or turkey. Pork with fat trimmed off. Fish and seafood. Egg whites. Dried beans, peas, or lentils. Unsalted nuts, nut butters, and seeds. Unsalted canned beans. Lean cuts of beef with fat trimmed off. Low-sodium, lean deli meat. Dairy Low-fat (1%) or fat-free (skim) milk. Fat-free, low-fat, or reduced-fat cheeses. Nonfat, low-sodium ricotta or cottage cheese. Low-fat or nonfat yogurt. Low-fat, low-sodium cheese. Fats and oils Soft margarine without trans fats. Vegetable oil. Low-fat, reduced-fat, or light mayonnaise and salad dressings (reduced-sodium). Canola, safflower, olive, soybean, and sunflower oils. Avocado. Seasoning and other foods Herbs. Spices. Seasoning mixes without salt. Unsalted popcorn and pretzels. Fat-free sweets. What foods are not recommended? The items listed may not be a complete list. Talk with your dietitian about what dietary choices are best for you. Grains Baked goods made with fat, such as croissants, muffins, or some breads. Dry pasta or rice meal packs. Vegetables Creamed or fried vegetables. Vegetables in a cheese sauce. Regular canned vegetables (not low-sodium or reduced-sodium). Regular canned tomato sauce and paste (not low-sodium or reduced-sodium). Regular tomato and vegetable juice (not low-sodium or reduced-sodium). Pickles. Olives. Fruits Canned fruit in a light or heavy syrup. Fried fruit. Fruit in cream or butter sauce. Meat and other protein foods Fatty cuts of meat. Ribs. Fried meat. Bacon. Sausage. Bologna and other processed lunch meats. Salami. Fatback. Hotdogs. Bratwurst. Salted nuts and seeds. Canned beans with added salt. Canned or smoked fish. Whole eggs or egg yolks. Chicken or turkey with skin. Dairy Whole or 2% milk, cream, and half-and-half. Whole or full-fat cream cheese. Whole-fat or sweetened yogurt. Full-fat cheese. Nondairy creamers. Whipped toppings.  Processed cheese and cheese spreads. Fats and oils Butter. Stick margarine. Lard. Shortening. Ghee. Bacon fat. Tropical oils, such as coconut, palm kernel, or palm oil. Seasoning and other foods Salted popcorn and pretzels. Onion salt, garlic salt, seasoned salt, table salt, and sea salt. Worcestershire sauce. Tartar sauce. Barbecue sauce. Teriyaki sauce. Soy sauce, including reduced-sodium. Steak sauce. Canned and packaged gravies. Fish sauce. Oyster sauce. Cocktail sauce. Horseradish that you find on the shelf. Ketchup. Mustard. Meat flavorings and tenderizers. Bouillon cubes. Hot sauce and Tabasco sauce. Premade or packaged marinades. Premade or packaged taco seasonings. Relishes. Regular salad dressings. Where to find more information:  National Heart, Lung, and Blood Institute: www.nhlbi.nih.gov  American Heart Association: www.heart.org Summary  The DASH eating plan is a healthy eating plan that has been shown to reduce high blood pressure (hypertension). It may also reduce your risk for type 2 diabetes, heart disease, and stroke.  With the DASH eating plan, you should limit salt (sodium) intake to 2,300 mg a day. If you have hypertension, you may need to reduce your sodium intake to 1,500 mg a day.  When on the DASH eating plan, aim to eat more fresh fruits and vegetables, whole grains, lean proteins, low-fat dairy, and heart-healthy fats.  Work with your health care provider or diet and nutrition specialist (dietitian) to adjust your eating plan to your individual   calorie needs. This information is not intended to replace advice given to you by your health care provider. Make sure you discuss any questions you have with your health care provider. Document Released: 12/19/2010 Document Revised: 12/24/2015 Document Reviewed: 12/24/2015 Elsevier Interactive Patient Education  2017 [employer] Hypertension Hypertension is another name for high blood pressure. High blood pressure forces  your heart to work harder to pump blood. This can cause problems over time. There are two numbers in a blood pressure reading. There is a top number (systolic) over a bottom number (diastolic). It is best to have a blood pressure below 120/80. Healthy choices can help lower your blood pressure. You may need medicine to help lower your blood pressure if:  Your blood pressure cannot be lowered with healthy choices.  Your blood pressure is higher than 130/80.  Follow these instructions at home: Eating and drinking  If directed, follow the DASH eating plan. This diet includes: ? Filling half of your plate at each meal with fruits and vegetables. ? Filling one quarter of your plate at each meal with whole grains. Whole grains include whole wheat pasta, brown rice, and whole grain bread. ? Eating or drinking low-fat dairy products, such as skim milk or low-fat yogurt. ? Filling one quarter of your plate at each meal with low-fat (lean) proteins. Low-fat proteins include fish, skinless chicken, eggs, beans, and tofu. ? Avoiding fatty meat, cured and processed meat, or chicken with skin. ? Avoiding premade or processed food.  Eat less than 1,500 mg of salt (sodium) a day.  Limit alcohol use to no more than 1 drink a day for nonpregnant women and 2 drinks a day for men. One drink equals 12 oz of beer, 5 oz of wine, or 1 oz of hard liquor. Lifestyle  Work with your doctor to stay at a healthy weight or to lose weight. Ask your doctor what the best weight is for you.  Get at least 30 minutes of exercise that causes your heart to beat faster (aerobic exercise) most days of the week. This may include walking, swimming, or biking.  Get at least 30 minutes of exercise that strengthens your muscles (resistance exercise) at least 3 days a week. This may include lifting weights or pilates.  Do not use any products that contain nicotine or tobacco. This includes cigarettes and e-cigarettes. If you need  help quitting, ask your doctor.  Check your blood pressure at home as told by your doctor.  Keep all follow-up visits as told by your doctor. This is important. Medicines  Take over-the-counter and prescription medicines only as told by your doctor. Follow directions carefully.  Do not skip doses of blood pressure medicine. The medicine does not work as well if you skip doses. Skipping doses also puts you at risk for problems.  Ask your doctor about side effects or reactions to medicines that you should watch for. Contact a doctor if:  You think you are having a reaction to the medicine you are taking.  You have headaches that keep coming back (recurring).  You feel dizzy.  You have swelling in your ankles.  You have trouble with your vision. Get help right away if:  You get a very bad headache.  You start to feel confused.  You feel weak or numb.  You feel faint.  You get very bad pain in your: ? Chest. ? Belly (abdomen).  You throw up (vomit) more than once.  You have trouble breathing. Summary  Hypertension is another name for high blood pressure.  Making healthy choices can help lower blood pressure. If your blood pressure cannot be controlled with healthy choices, you may need to take medicine. This information is not intended to replace advice given to you by your health care provider. Make sure you discuss any questions you have with your health care provider. Document Released: 06/18/2007 Document Revised: 11/28/2015 Document Reviewed: 11/28/2015 Elsevier Interactive Patient Education  2018 [employer]. Dyslipidemia Dyslipidemia is an imbalance of waxy, fat-like substances (lipids) in the blood. The body needs lipids in small amounts. Dyslipidemia often involves a high level of cholesterol or triglycerides, which are types of lipids. Common forms of dyslipidemia include:  High levels of bad cholesterol (LDL cholesterol). LDL is the type of cholesterol  that causes fatty deposits (plaques) to build up in the blood vessels that carry blood away from your heart (arteries).  Low levels of good cholesterol (HDL cholesterol). HDL cholesterol is the type of cholesterol that protects against heart disease. High levels of HDL remove the LDL buildup from arteries.  High levels of triglycerides. Triglycerides are a fatty substance in the blood that is linked to a buildup of plaques in the arteries.  You can develop dyslipidemia because of the genes you are born with (primary dyslipidemia) or changes that occur during your life (secondary dyslipidemia), or as a side effect of certain medical treatments. What are the causes? Primary dyslipidemia is caused by changes (mutations) in genes that are passed down through families (inherited). These mutations cause several types of dyslipidemia. Mutations can result in disorders that make the body produce too much LDL cholesterol or triglycerides, or not enough HDL cholesterol. These disorders may lead to heart disease, arterial disease, or stroke at an early age. Causes of secondary dyslipidemia include certain lifestyle choices and diseases that lead to dyslipidemia, such as:  Eating a diet that is high in animal fat.  Not getting enough activity or exercise (having a sedentary lifestyle).  Having diabetes, kidney disease, liver disease, or thyroid disease.  Drinking large amounts of alcohol.  Using certain types of drugs.  What increases the risk? You may be at greater risk for dyslipidemia if you are an older man or if you are a woman who has gone through menopause. Other risk factors include:  Having a family history of dyslipidemia.  Taking certain medicines, including birth control pills, steroids, some diuretics, beta-blockers, and some medicines forHIV.  Smoking cigarettes.  Eating a high-fat diet.  Drinking large amounts of alcohol.  Having certain medical conditions such as diabetes,  polycystic ovary syndrome (PCOS), pregnancy, kidney disease, liver disease, or hypothyroidism.  Not exercising regularly.  Being overweight or obese with too much belly fat.  What are the signs or symptoms? Dyslipidemia does not usually cause any symptoms. Very high lipid levels can cause fatty bumps under the skin (xanthomas) or a white or gray ring around the black center (pupil) of the eye. Very high triglyceride levels can cause inflammation of the pancreas (pancreatitis). How is this diagnosed? Your health care provider may diagnose dyslipidemia based on a routine blood test (fasting blood test). Because most people do not have symptoms of the condition, this blood testing (lipid profile) is done on adults age 80 and older and is repeated every 5 years. This test checks:  Total cholesterol. This is a measure of the total amount of cholesterol in your blood, including LDL cholesterol, HDL cholesterol, and triglycerides. A healthy number is below 200.  LDL cholesterol. The target number for LDL cholesterol is different for each person, depending on individual risk factors. For most people, a number below 100 is healthy. Ask your health care provider what your LDL cholesterol number should be.  HDL cholesterol. An HDL level of 60 or higher is best because it helps to protect against heart disease. A number below 40 for men or below 50 for women increases the risk for heart disease.  Triglycerides. A healthy triglyceride number is below 150.  If your lipid profile is abnormal, your health care provider may do other blood tests to get more information about your condition. How is this treated? Treatment depends on the type of dyslipidemia that you have and your other risk factors for heart disease and stroke. Your health care provider will have a target range for your lipid levels based on this information. For many people, treatment starts with lifestyle changes, such as diet and exercise.  Your health care provider may recommend that you:  Get regular exercise.  Make changes to your diet.  Quit smoking if you smoke.  If diet changes and exercise do not help you reach your goals, your health care provider may also prescribe medicine to lower lipids. The most commonly prescribed type of medicine lowers your LDL cholesterol (statin drug). If you have a high triglyceride level, your provider may prescribe another type of drug (fibrate) or an omega-3 fish oil supplement, or both. Follow these instructions at home:  Take over-the-counter and prescription medicines only as told by your health care provider. This includes supplements.  Get regular exercise. Start an aerobic exercise and strength training program as told by your health care provider. Ask your health care provider what activities are safe for you. Your health care provider may recommend: ? 30 minutes of aerobic activity 4-6 days a week. Brisk walking is an example of aerobic activity. ? Strength training 2 days a week.  Eat a healthy diet as told by your health care provider. This can help you reach and maintain a healthy weight, lower your LDL cholesterol, and raise your HDL cholesterol. It may help to work with a diet and nutrition specialist (dietitian) to make a plan that is right for you. Your dietitian or health care provider may recommend: ? Limiting your calories, if you are overweight. ? Eating more fruits, vegetables, whole grains, fish, and lean meats. ? Limiting saturated fat, trans fat, and cholesterol.  Follow instructions from your health care provider or dietitian about eating or drinking restrictions.  Limit alcohol intake to no more than one drink per day for nonpregnant women and two drinks per day for men. One drink equals 12 oz of beer, 5 oz of wine, or 1 oz of hard liquor.  Do not use any products that contain nicotine or tobacco, such as cigarettes and e-cigarettes. If you need help quitting,  ask your health care provider.  Keep all follow-up visits as told by your health care provider. This is important. Contact a health care provider if:  You are having trouble sticking to your exercise or diet plan.  You are struggling to quit smoking or control your use of alcohol. Summary  Dyslipidemia is an imbalance of waxy, fat-like substances (lipids) in the blood. The body needs lipids in small amounts. Dyslipidemia often involves a high level of cholesterol or triglycerides, which are types of lipids.  Treatment depends on the type of dyslipidemia that you have and your other risk factors for heart disease  and stroke.  For many people, treatment starts with lifestyle changes, such as diet and exercise. Your health care provider may also prescribe medicine to lower lipids. This information is not intended to replace advice given to you by your health care provider. Make sure you discuss any questions you have with your health care provider. Document Released: 01/04/2013 Document Revised: 08/27/2015 Document Reviewed: 08/27/2015 Elsevier Interactive Patient Education  [employer]2018 [employer]

## 2016-12-03 NOTE — Progress Notes (Signed)
Jason Foster, is a 56 y.o. male  FEO:[id number]  TGP:[id number]  DOB - 06/26/1960  Chief Complaint  Patient presents with  . Establish Care      Subjective:   Jason Foster is a 56 y.o. male with history of hypertension, hyperlipidemia, and coronary artery disease s/p anterior STEMI on 02/10/13 treated with DES x 2 of the LAD (3.0x 38 mm Promus and 3.5 x 32 Promus) here today for a follow up visit and medication refill. Patient has no new complaint today. He denies any chest pain or shortness of breath. No edema or palpitations. Patient claims he has been doing very well at home. He is adherent with his medications, reports no side effects. Patient has No headache, No chest pain, No abdominal pain - No Nausea, No new weakness tingling or numbness, No Cough - SOB.  ALLERGIES: No Known Allergies  PAST MEDICAL HISTORY: Past Medical History:  Diagnosis Date  . Anemia    a. 11/2013 in setting of ulcer/gastritis.  Marland Kitchen CAD (coronary artery disease)    a. Ant STEMI (01/2013):  LHC - dLM 20, pLAD 100 (DES x 2), CFX < 20, mRCA 100% (L-R collats)  . Chronic systolic CHF (congestive heart failure) ([place]) 10/13/2014  . Duodenal ulcer    a. 11/2013 EGD: medium sized, non-bleeding ulcer @ duodenal bulb.  . Duodenitis without hemorrhage    a. 11/2013 EGD: erosive duodenitis w/o bleeding.  . Erosive gastritis    a. 11/2013 EGD: prepyloric region of stomach and gastric antrum.  Marland Kitchen HLD (hyperlipidemia)   . Hypertension   . Ischemic cardiomyopathy    a. echo (01/2013):  mild LVH, EF 35-40%, AS and apical AK, PASP 34    MEDICATIONS AT HOME: Prior to Admission medications   Medication Sig Start Date End Date Taking? Authorizing Provider  aspirin EC 81 MG tablet Take 1 tablet (81 mg total) by mouth daily. 12/03/16  Yes Bonniejean Piano E, MD  atorvastatin (LIPITOR) 80 MG tablet TAKE 1 TABLET BY MOUTH DAILY AT 6PM 12/03/16  Yes Earl Zellmer E, MD  carvedilol (COREG) 6.25 MG tablet TAKE 1  TABLET BY MOUTH 2 TIMES DAILY WITH A MEAL. 12/03/16  Yes Tresa Garter, MD  clopidogrel (PLAVIX) 75 MG tablet Take 1 tablet (75 mg total) by mouth daily. 12/03/16  Yes Tresa Garter, MD  lisinopril (PRINIVIL,ZESTRIL) 20 MG tablet Take 1 tablet (20 mg total) by mouth daily. 12/03/16  Yes Tresa Garter, MD  nitroGLYCERIN (NITROSTAT) 0.4 MG SL tablet Place 1 tablet (0.4 mg total) under the tongue every 5 (five) minutes as needed for chest pain. 12/03/16  Yes Tresa Garter, MD  spironolactone (ALDACTONE) 25 MG tablet Take 0.5 tablets (12.5 mg total) by mouth daily. 12/03/16  Yes Tresa Garter, MD    Objective:   Vitals:   12/03/16 1537  BP: 112/68  Pulse: 78  Resp: 18  Temp: 98.2 F (36.8 C)  TempSrc: Oral  SpO2: 100%  Weight: 211 lb 9.6 oz (96 kg)  Height: '5\' 8"'  (1.727 m)   Exam General appearance : Awake, alert, not in any distress. Speech Clear. Not toxic looking, obese HEENT: Atraumatic and Normocephalic, pupils equally reactive to light and accomodation Neck: Supple, no JVD. No cervical lymphadenopathy.  Chest: Good air entry bilaterally, no added sounds  CVS: S1 S2 regular, no murmurs.  Abdomen: Bowel sounds present, Non tender and not distended with no gaurding, rigidity or rebound. Extremities: B/L Lower Ext shows no edema, both  legs are warm to touch Neurology: Awake alert, and oriented X 3, CN II-XII intact, Non focal Skin: No Rash  Data Review Lab Results  Component Value Date   HGBA1C 5.60 07/13/2014   HGBA1C 6.1 (H) 02/10/2013    Assessment & Plan   1. Coronary artery disease involving native coronary artery of native heart without angina pectoris  - nitroGLYCERIN (NITROSTAT) 0.4 MG SL tablet; Place 1 tablet (0.4 mg total) under the tongue every 5 (five) minutes as needed for chest pain.  Dispense: 25 tablet; Refill: 11 - clopidogrel (PLAVIX) 75 MG tablet; Take 1 tablet (75 mg total) by mouth daily.  Dispense: 90 tablet; Refill:  3 - aspirin EC 81 MG tablet; Take 1 tablet (81 mg total) by mouth daily.  Dispense: 90 tablet; Refill: 3  2. Chronic systolic CHF (congestive heart failure) (HCC)  - spironolactone (ALDACTONE) 25 MG tablet; Take 0.5 tablets (12.5 mg total) by mouth daily.  Dispense: 45 tablet; Refill: 2 - nitroGLYCERIN (NITROSTAT) 0.4 MG SL tablet; Place 1 tablet (0.4 mg total) under the tongue every 5 (five) minutes as needed for chest pain.  Dispense: 25 tablet; Refill: 11 - lisinopril (PRINIVIL,ZESTRIL) 20 MG tablet; Take 1 tablet (20 mg total) by mouth daily.  Dispense: 90 tablet; Refill: 3 - carvedilol (COREG) 6.25 MG tablet; TAKE 1 TABLET BY MOUTH 2 TIMES DAILY WITH A MEAL.  Dispense: 180 tablet; Refill: 2  - CMP14+EGFR - TSH  3. Essential hypertension  - nitroGLYCERIN (NITROSTAT) 0.4 MG SL tablet; Place 1 tablet (0.4 mg total) under the tongue every 5 (five) minutes as needed for chest pain.  Dispense: 25 tablet; Refill: 11  4. Dyslipidemia  - atorvastatin (LIPITOR) 80 MG tablet; TAKE 1 TABLET BY MOUTH DAILY AT 6PM  Dispense: 90 tablet; Refill: 3 - Lipid panel  Patient have been counseled extensively about nutrition and exercise. Other issues discussed during this visit include: low cholesterol diet, weight control and daily exercise, importance of adherence with medications and regular follow-up. We also discussed long term complications of uncontrolled hypertension.   Return in about 6 months (around 06/02/2017) for Heart Failure and Hypertension, Routine Follow Up.  The patient was given clear instructions to go to ER or return to medical center if symptoms don't improve, worsen or new problems develop. The patient verbalized understanding. The patient was told to call to get lab results if they haven't heard anything in the next week.   This note has been created with Surveyor, quantity. Any transcriptional errors are unintentional.    Angelica Chessman, MD, [place], Karilyn Cota, [place] and [place], [place]   12/03/2016, 4:08 PM

## 2016-12-04 LAB — CMP14+EGFR
ALBUMIN: 4.3 g/dL (ref 3.5–5.5)
ALK PHOS: 94 IU/L (ref 39–117)
ALT: 18 IU/L (ref 0–44)
AST: 21 IU/L (ref 0–40)
Albumin/Globulin Ratio: 1.2 (ref 1.2–2.2)
BILIRUBIN TOTAL: 0.3 mg/dL (ref 0.0–1.2)
BUN / CREAT RATIO: 17 (ref 9–20)
BUN: 16 mg/dL (ref 6–24)
CHLORIDE: 100 mmol/L (ref 96–106)
CO2: 25 mmol/L (ref 20–29)
Calcium: 9.2 mg/dL (ref 8.7–10.2)
Creatinine, Ser: 0.94 mg/dL (ref 0.76–1.27)
GFR calc Af Amer: 103 mL/min/{1.73_m2} (ref >59)
GFR calc non Af Amer: 89 mL/min/{1.73_m2} (ref >59)
GLUCOSE: 83 mg/dL (ref 65–99)
Globulin, Total: 3.6 g/dL (ref 1.5–4.5)
Potassium: 4.6 mmol/L (ref 3.5–5.2)
Sodium: 138 mmol/L (ref 134–144)
TOTAL PROTEIN: 7.9 g/dL (ref 6.0–8.5)

## 2016-12-04 LAB — LIPID PANEL
CHOLESTEROL TOTAL: 186 mg/dL (ref 100–199)
Chol/HDL Ratio: 6.2 ratio — ABNORMAL HIGH (ref 0.0–5.0)
HDL: 30 mg/dL — ABNORMAL LOW (ref >39)
LDL Calculated: 122 mg/dL — ABNORMAL HIGH (ref 0–99)
Triglycerides: 170 mg/dL — ABNORMAL HIGH (ref 0–149)
VLDL Cholesterol Cal: 34 mg/dL (ref 5–40)

## 2016-12-04 LAB — TSH: TSH: 1.61 u[IU]/mL (ref 0.450–4.500)

## 2016-12-26 ENCOUNTER — Emergency Department (HOSPITAL_COMMUNITY): Payer: Medicaid Other

## 2016-12-26 ENCOUNTER — Emergency Department (HOSPITAL_COMMUNITY)
Admission: EM | Admit: 2016-12-26 | Discharge: 2016-12-26 | Disposition: A | Discharge status: Home / Self Care | Payer: Medicaid Other | Attending: Emergency Medicine | Admitting: Emergency Medicine

## 2016-12-26 ENCOUNTER — Other Ambulatory Visit: Payer: Self-pay

## 2016-12-26 ENCOUNTER — Encounter (HOSPITAL_COMMUNITY): Payer: Self-pay

## 2016-12-26 DIAGNOSIS — Z7982 Long term (current) use of aspirin: Secondary | ICD-10-CM | POA: Diagnosis not present

## 2016-12-26 DIAGNOSIS — I110 Hypertensive heart disease with heart failure: Secondary | ICD-10-CM | POA: Insufficient documentation

## 2016-12-26 DIAGNOSIS — I252 Old myocardial infarction: Secondary | ICD-10-CM | POA: Diagnosis not present

## 2016-12-26 DIAGNOSIS — Y998 Other external cause status: Secondary | ICD-10-CM | POA: Insufficient documentation

## 2016-12-26 DIAGNOSIS — Z79899 Other long term (current) drug therapy: Secondary | ICD-10-CM | POA: Insufficient documentation

## 2016-12-26 DIAGNOSIS — R51 Headache: Secondary | ICD-10-CM | POA: Insufficient documentation

## 2016-12-26 DIAGNOSIS — I2510 Atherosclerotic heart disease of native coronary artery without angina pectoris: Secondary | ICD-10-CM | POA: Diagnosis not present

## 2016-12-26 DIAGNOSIS — Z87891 Personal history of nicotine dependence: Secondary | ICD-10-CM | POA: Insufficient documentation

## 2016-12-26 DIAGNOSIS — Z955 Presence of coronary angioplasty implant and graft: Secondary | ICD-10-CM | POA: Diagnosis not present

## 2016-12-26 DIAGNOSIS — I5022 Chronic systolic (congestive) heart failure: Secondary | ICD-10-CM | POA: Diagnosis not present

## 2016-12-26 DIAGNOSIS — R519 Headache, unspecified: Secondary | ICD-10-CM

## 2016-12-26 DIAGNOSIS — Y92410 Unspecified street and highway as the place of occurrence of the external cause: Secondary | ICD-10-CM | POA: Insufficient documentation

## 2016-12-26 DIAGNOSIS — Y9389 Activity, other specified: Secondary | ICD-10-CM | POA: Diagnosis not present

## 2016-12-26 DIAGNOSIS — Z7902 Long term (current) use of antithrombotics/antiplatelets: Secondary | ICD-10-CM | POA: Insufficient documentation

## 2016-12-26 MED ORDER — METHOCARBAMOL 500 MG PO TABS: 500.0000 mg | ORAL_TABLET | Freq: Two times a day (BID) | ORAL | 0 refills | Status: DC

## 2016-12-26 NOTE — ED Notes (Signed)
Patient transported to CT 
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
## 2017-01-12 ENCOUNTER — Telehealth: Payer: Self-pay | Admitting: *Deleted

## 2017-01-12 NOTE — Telephone Encounter (Signed)
MA unable to contact patient of leave a voice message. !!!Please inform patient of cholesterol being very high and needing to take medications for lowering as prescribed. Patient needs to increase fiber and exercise!!!

## 2017-01-12 NOTE — Telephone Encounter (Signed)
-----   Message from Quentin Angst, MD sent at 12/05/2016  2:56 PM EST ----- Normal results except for very high cholesterol. Please adhere with your cholesterol medication and please limit saturated fat to no more than 7% of your calories, limit cholesterol to 200 mg/day, increase fiber and exercise as tolerated. If needed we may add another cholesterol lowering medication to your regimen.

## 2017-03-02 MED FILL — SPIRONOLACTONE 25 MG TABS: 25 | 90 days supply | Qty: 45 | Fill #1

## 2017-03-02 MED FILL — CLOPIDOGREL 75 MG TABLET: 75 | 90 days supply | Qty: 90 | Fill #1

## 2017-03-02 MED FILL — LISINOPRIL 20 MG TAB: 20 | 90 days supply | Qty: 90 | Fill #1

## 2017-03-02 MED FILL — CARVEDILOL 6.25 MG TABLET: 6.25 | 90 days supply | Qty: 180 | Fill #1

## 2017-03-02 MED FILL — ATORVASTATIN 80 MG TABLET: 80 | 90 days supply | Qty: 90 | Fill #1
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
## 2017-03-09 ENCOUNTER — Encounter: Payer: Self-pay | Admitting: Cardiology

## 2017-03-09 ENCOUNTER — Ambulatory Visit (INDEPENDENT_AMBULATORY_CARE_PROVIDER_SITE_OTHER): Payer: Self-pay | Admitting: Cardiology

## 2017-03-09 VITALS — BP 128/86 | HR 81 | Ht 68.0 in | Wt 211.4 lb

## 2017-03-09 DIAGNOSIS — I2510 Atherosclerotic heart disease of native coronary artery without angina pectoris: Secondary | ICD-10-CM

## 2017-03-09 DIAGNOSIS — I255 Ischemic cardiomyopathy: Secondary | ICD-10-CM

## 2017-03-09 DIAGNOSIS — I5022 Chronic systolic (congestive) heart failure: Secondary | ICD-10-CM

## 2017-03-09 DIAGNOSIS — I10 Essential (primary) hypertension: Secondary | ICD-10-CM

## 2017-03-09 DIAGNOSIS — E785 Hyperlipidemia, unspecified: Secondary | ICD-10-CM

## 2017-03-09 NOTE — Patient Instructions (Signed)
Continue your current therapy  We will see you in 6 months with lab work 

## 2017-07-20 ENCOUNTER — Other Ambulatory Visit: Payer: Self-pay | Admitting: Cardiology

## 2017-07-20 MED FILL — CLOPIDOGREL 75 MG TABLET: 75 | 90 days supply | Qty: 90 | Fill #2

## 2017-07-20 MED FILL — ATORVASTATIN 80 MG TABLET: 80 | 90 days supply | Qty: 90 | Fill #2

## 2017-07-20 MED FILL — CARVEDILOL 6.25 MG TABLET: 6.25 | 90 days supply | Qty: 180 | Fill #0

## 2017-07-20 MED FILL — LISINOPRIL 20 MG TAB: 20 | 90 days supply | Qty: 90 | Fill #2

## 2017-07-20 MED FILL — SPIRONOLACTONE 25 MG TABLET: 25 | 90 days supply | Qty: 45 | Fill #1

## 2017-07-22 MED FILL — NITROSTAT 0.4 MG TABLET SL: 0.4 | 30 days supply | Qty: 25 | Fill #1

## 2017-09-20 NOTE — Progress Notes (Signed)
Jason Foster Date of Birth: June 29, 1960 Medical Record #[id number]  History of Present Illness: Jason Foster is seen for follow up CAD. He is s/p anterior STEMI on 02/10/13 treated with DES x 2 of the LAD. ( 3.0x 38 mm Promus and 3.5 x 32 Promus) He has a history of HTN and hyperlipidemia. He is a former smoker. On emergent cardiac cath he had occlusion of the proximal LAD. The RCA was also occluded in the mid vessel but this appeared chronic and was collateralized. EF was 35-40%. In November 2015 he was admitted with an UGI bleed. He was found to have a gastritis/duodenitis and duodenal ulcer. He was treated with PPI and Hgb later normalized.  On follow up today he is feeling  well. Marland Kitchen He denies any chest pain or SOB. No edema or palpitations. He is taking his medication regularly.  Notes he is eating too much bread and rice. Planning to eat more chicken and salads. Is inactive.  Current Outpatient Medications on File Prior to Visit  Medication Sig Dispense Refill  . aspirin EC 81 MG tablet Take 1 tablet (81 mg total) by mouth daily. (Patient taking differently: Take 81 mg by mouth daily. ) 90 tablet 3  . atorvastatin (LIPITOR) 80 MG tablet TAKE 1 TABLET BY MOUTH DAILY AT 6PM 90 tablet 3  . carvedilol (COREG) 6.25 MG tablet TAKE 1 TABLET BY MOUTH 2 TIMES DAILY WITH A MEAL. 180 tablet 2  . clopidogrel (PLAVIX) 75 MG tablet Take 1 tablet (75 mg total) by mouth daily. 90 tablet 3  . lisinopril (PRINIVIL,ZESTRIL) 20 MG tablet Take 1 tablet (20 mg total) by mouth daily. 90 tablet 3  . nitroGLYCERIN (NITROSTAT) 0.4 MG SL tablet Place 1 tablet (0.4 mg total) under the tongue every 5 (five) minutes as needed for chest pain. 25 tablet 11  . spironolactone (ALDACTONE) 25 MG tablet Take 0.5 tablets (12.5 mg total) by mouth daily. 45 tablet 2  . methocarbamol (ROBAXIN) 500 MG tablet Take 1 tablet (500 mg total) by mouth 2 (two) times daily. (Patient not taking: Reported on 09/24/2017) 20 tablet 0   No current  facility-administered medications on file prior to visit.     No Known Allergies  Past Medical History:  Diagnosis Date  . Anemia    a. 11/2013 in setting of ulcer/gastritis.  Marland Kitchen CAD (coronary artery disease)    a. Ant STEMI (01/2013):  LHC - dLM 20, pLAD 100 (DES x 2), CFX < 20, mRCA 100% (L-R collats)  . Chronic systolic CHF (congestive heart failure) (HCC) 10/13/2014  . Duodenal ulcer    a. 11/2013 EGD: medium sized, non-bleeding ulcer @ duodenal bulb.  . Duodenitis without hemorrhage    a. 11/2013 EGD: erosive duodenitis w/o bleeding.  . Erosive gastritis    a. 11/2013 EGD: prepyloric region of stomach and gastric antrum.  Marland Kitchen HLD (hyperlipidemia)   . Hypertension   . Ischemic cardiomyopathy    a. echo (01/2013):  mild LVH, EF 35-40%, AS and apical AK, PASP 34    Past Surgical History:  Procedure Laterality Date  . CORONARY ANGIOGRAM  02/10/2013   Procedure: CORONARY ANGIOGRAM;  Surgeon: Temesha Queener M Swazil[place], MD;  Location: [facility] CATH LAB;  Service: Cardiovascular;;  . CORONARY ANGIOPLASTY WITH STENT PLACEMENT  01/2013  . ESOPHAGOGASTRODUODENOSCOPY N/A 11/16/2013   Procedure: ESOPHAGOGASTRODUODENOSCOPY (EGD);  Surgeon: Beverley Fiedler, MD;  Location: [facility] ENDOSCOPY;  Service: Endoscopy;  Laterality: N/A;  . PERCUTANEOUS CORONARY STENT INTERVENTION (PCI-S)  02/10/2013  Procedure: PERCUTANEOUS CORONARY STENT INTERVENTION (PCI-S);  Surgeon: Tavita Eastham M Sw[place]il[place], MD;  Location: [facility] CATH LAB;  Service: Cardiovascular;;  DES x2 Prox LAD to MID LAD  . PILONIDAL CYST EXCISION  ~ 1995    Social History   Tobacco Use  Smoking Status Former Smoker  . Packs/day: 0.50  . Years: 34.00  . Pack years: 17.00  . Types: Cigarettes  . Last attempt to quit: 04/13/2012  . Years since quitting: 5.4  Smokeless Tobacco Never Used    Social History   Substance and Sexual Activity  Alcohol Use No    Family History  Family history unknown: Yes    Review of Systems: As noted in HPI.  All other systems were  reviewed and are negative.  Physical Exam: BP 130/88 (BP Location: Left Arm, Patient Position: Sitting)   Pulse 79   Ht 5\' 8"  (1.727 m)   Wt 216 lb (98 kg)   BMI 32.84 kg/m  GENERAL:  Well appearing HEENT:  PERRL, EOMI, sclera are clear. Oropharynx is clear. NECK:  No jugular venous distention, carotid upstroke brisk and symmetric, no bruits, no thyromegaly or adenopathy LUNGS:  Clear to auscultation bilaterally CHEST:  Unremarkable HEART:  RRR,  PMI not displaced or sustained,S1 and S2 within normal limits, no S3, no S4: no clicks, no rubs, no murmurs ABD:  Soft, nontender. BS +, no masses or bruits. No hepatomegaly, no splenomegaly EXT:  2 + pulses throughout, no edema, no cyanosis no clubbing SKIN:  Warm and dry.  No rashes NEURO:  Alert and oriented x 3. Cranial nerves II through XII intact. PSYCH:  Cognitively intact   LABORATORY DATA: Lab Results  Component Value Date   WBC 8.7 07/13/2014   HGB 14.1 07/13/2014   HCT 42.3 07/13/2014   PLT 214 07/13/2014   GLUCOSE 83 12/03/2016   CHOL 186 12/03/2016   TRIG 170 (H) 12/03/2016   HDL 30 (L) 12/03/2016   LDLCALC 122 (H) 12/03/2016   ALT 18 12/03/2016   AST 21 12/03/2016   NA 138 12/03/2016   K 4.6 12/03/2016   CL 100 12/03/2016   CREATININE 0.94 12/03/2016   BUN 16 12/03/2016   CO2 25 12/03/2016   TSH 1.610 12/03/2016   INR 1.26 11/14/2013   HGBA1C 5.60 07/13/2014   Ecg today shows NSR with old inferior and anteroseptal infarct. Unchanged from prior. I have personally reviewed and interpreted this study.   Assessment/plan:   1. CAD s/p anterior MI with stenting of the LAD with DES x 2 in January 2015. Given extensive stenting in the LAD I would prefer long term DAPT.  Will treat chronic total occlusion of RCA medically.  He is asymptomatic. Will focus on lifestyle modification with regular aerobic exercise, weight loss and diet.   2. Ischemic cardiomyopathy. EF 35-40% initially with MI. EF improved to normal 2  months later. No evidence of volume overload.  Continue Coreg, lisinopril, aldactone.   3. HTN controlled.  4. Hyperlipidemia on high dose statin. Reinforced compliance. Stressed importance of lifestyle modification. Check fasting lab work today.

## 2017-09-24 ENCOUNTER — Ambulatory Visit (INDEPENDENT_AMBULATORY_CARE_PROVIDER_SITE_OTHER): Payer: Self-pay | Admitting: Cardiology

## 2017-09-24 ENCOUNTER — Encounter: Payer: Self-pay | Admitting: Cardiology

## 2017-09-24 VITALS — BP 130/88 | HR 79 | Ht 68.0 in | Wt 216.0 lb

## 2017-09-24 DIAGNOSIS — I10 Essential (primary) hypertension: Secondary | ICD-10-CM

## 2017-09-24 DIAGNOSIS — I2510 Atherosclerotic heart disease of native coronary artery without angina pectoris: Secondary | ICD-10-CM

## 2017-09-24 DIAGNOSIS — E785 Hyperlipidemia, unspecified: Secondary | ICD-10-CM

## 2017-09-24 LAB — CBC WITH DIFFERENTIAL/PLATELET
BASOS: 0 %
Basophils Absolute: 0 10*3/uL (ref 0.0–0.2)
EOS (ABSOLUTE): 0.6 10*3/uL — ABNORMAL HIGH (ref 0.0–0.4)
EOS: 7 %
HEMATOCRIT: 42.1 % (ref 37.5–51.0)
Hemoglobin: 14.1 g/dL (ref 13.0–17.7)
IMMATURE GRANS (ABS): 0 10*3/uL (ref 0.0–0.1)
IMMATURE GRANULOCYTES: 0 %
Lymphocytes Absolute: 3 10*3/uL (ref 0.7–3.1)
Lymphs: 37 %
MCH: 26.8 pg (ref 26.6–33.0)
MCHC: 33.5 g/dL (ref 31.5–35.7)
MCV: 80 fL (ref 79–97)
MONOCYTES: 6 %
Monocytes Absolute: 0.5 10*3/uL (ref 0.1–0.9)
Neutrophils Absolute: 4 10*3/uL (ref 1.4–7.0)
Neutrophils: 50 %
Platelets: 230 10*3/uL (ref 150–450)
RBC: 5.27 x10E6/uL (ref 4.14–5.80)
RDW: 14.4 % (ref 12.3–15.4)
WBC: 8.1 10*3/uL (ref 3.4–10.8)

## 2017-09-24 LAB — BASIC METABOLIC PANEL
BUN/Creatinine Ratio: 15 (ref 9–20)
BUN: 16 mg/dL (ref 6–24)
CALCIUM: 9.3 mg/dL (ref 8.7–10.2)
CO2: 21 mmol/L (ref 20–29)
CREATININE: 1.04 mg/dL (ref 0.76–1.27)
Chloride: 102 mmol/L (ref 96–106)
GFR calc Af Amer: 90 mL/min/{1.73_m2} (ref >59)
GFR, EST NON AFRICAN AMERICAN: 78 mL/min/{1.73_m2} (ref >59)
Glucose: 136 mg/dL — ABNORMAL HIGH (ref 65–99)
Potassium: 4.8 mmol/L (ref 3.5–5.2)
Sodium: 139 mmol/L (ref 134–144)

## 2017-09-24 LAB — HEPATIC FUNCTION PANEL
ALT: 19 IU/L (ref 0–44)
AST: 20 IU/L (ref 0–40)
Albumin: 4.4 g/dL (ref 3.5–5.5)
Alkaline Phosphatase: 114 IU/L (ref 39–117)
Bilirubin Total: 0.2 mg/dL (ref 0.0–1.2)
Bilirubin, Direct: 0.11 mg/dL (ref 0.00–0.40)
TOTAL PROTEIN: 7.6 g/dL (ref 6.0–8.5)

## 2017-09-24 LAB — LIPID PANEL W/O CHOL/HDL RATIO
Cholesterol, Total: 155 mg/dL (ref 100–199)
HDL: 27 mg/dL — AB (ref >39)
LDL Calculated: 66 mg/dL (ref 0–99)
Triglycerides: 310 mg/dL — ABNORMAL HIGH (ref 0–149)
VLDL CHOLESTEROL CAL: 62 mg/dL — AB (ref 5–40)

## 2017-09-24 NOTE — Patient Instructions (Signed)
Lab work today  ( cbc,bmet,lipid and hepatic panels )     Your physician wants you to follow-up in: 6 months. You will receive a reminder letter in the mail two months in advance. If you don't receive a letter, please call our office to schedule the follow-up appointment.

## 2017-10-23 ENCOUNTER — Other Ambulatory Visit: Payer: Self-pay | Admitting: Cardiology

## 2017-10-23 MED FILL — LISINOPRIL 20 MG TAB: 20 | 90 days supply | Qty: 90 | Fill #3

## 2017-10-23 MED FILL — ATORVASTATIN 80 MG TABLET: 80 | 90 days supply | Qty: 90 | Fill #3

## 2017-10-23 MED FILL — CLOPIDOGREL 75 MG TABLET: 75 | 90 days supply | Qty: 90 | Fill #3

## 2017-10-23 MED FILL — SPIRONOLACTONE 25 MG TABLET: 25 | 90 days supply | Qty: 45 | Fill #2

## 2017-10-26 MED FILL — CARVEDILOL 6.25 MG TABLET: 6.25 | 90 days supply | Qty: 180 | Fill #0

## 2018-01-22 MED FILL — CARVEDILOL 6.25 MG TABLET: 6.25 | 90 days supply | Qty: 180 | Fill #1

## 2018-01-26 ENCOUNTER — Telehealth: Payer: Self-pay | Admitting: Internal Medicine

## 2018-01-26 DIAGNOSIS — I5022 Chronic systolic (congestive) heart failure: Secondary | ICD-10-CM

## 2018-01-26 DIAGNOSIS — I2510 Atherosclerotic heart disease of native coronary artery without angina pectoris: Secondary | ICD-10-CM

## 2018-01-26 DIAGNOSIS — E785 Hyperlipidemia, unspecified: Secondary | ICD-10-CM

## 2018-01-26 NOTE — Telephone Encounter (Signed)
Patient came in to clinic request his medication for   atorvastatin (LIPITOR) 80 MG tablet   clopidogrel (PLAVIX) 75 MG tablet   lisinopril (PRINIVIL,ZESTRIL) 20 MG tablet   nitroGLYCERIN (NITROSTAT) 0.4 MG SL tablet   Patient was lats seen by Dr.Jegede in Nov, 2018  Patient set up an appointment for Feb 11, 2018 @9 :10 am   Patient uses [facility]   Please advice [phone]  Thank you Jason Foster

## 2018-01-26 NOTE — Telephone Encounter (Signed)
Pt called again to request -spironolactone (ALDACTONE) 25 MG tablet  To -[facility] Please follow up

## 2018-01-27 ENCOUNTER — Other Ambulatory Visit: Payer: Self-pay

## 2018-01-27 DIAGNOSIS — I2510 Atherosclerotic heart disease of native coronary artery without angina pectoris: Secondary | ICD-10-CM

## 2018-01-27 DIAGNOSIS — I10 Essential (primary) hypertension: Secondary | ICD-10-CM

## 2018-01-27 DIAGNOSIS — E785 Hyperlipidemia, unspecified: Secondary | ICD-10-CM

## 2018-01-27 DIAGNOSIS — I5022 Chronic systolic (congestive) heart failure: Secondary | ICD-10-CM

## 2018-01-27 MED ORDER — CLOPIDOGREL BISULFATE 75 MG PO TABS: 75.0000 mg | ORAL_TABLET | Freq: Every day | ORAL | 0 refills | Status: DC

## 2018-01-27 MED ORDER — LISINOPRIL 20 MG PO TABS: 20.0000 mg | ORAL_TABLET | Freq: Every day | ORAL | 0 refills | Status: DC

## 2018-01-27 MED ORDER — ATORVASTATIN CALCIUM 80 MG PO TABS: ORAL_TABLET | ORAL | 0 refills | Status: DC

## 2018-01-27 MED ORDER — SPIRONOLACTONE 25 MG PO TABS: 12.5000 mg | ORAL_TABLET | Freq: Every day | ORAL | 0 refills | Status: DC

## 2018-01-27 MED ORDER — NITROGLYCERIN 0.4 MG SL SUBL: 0.4000 mg | SUBLINGUAL_TABLET | SUBLINGUAL | 0 refills | Status: DC | PRN

## 2018-01-27 MED FILL — NITROGLYCERIN 0.4 MG TAB SL: 0.4 | 25 days supply | Qty: 25 | Fill #0

## 2018-01-27 MED FILL — SPIRONOLACTONE 25 MG TABLET: 25 | 30 days supply | Qty: 15 | Fill #0

## 2018-01-27 MED FILL — ATORVASTATIN 80 MG TABLET: 80 | 30 days supply | Qty: 30 | Fill #0

## 2018-01-27 MED FILL — LISINOPRIL 20 MG TAB: 20 | 30 days supply | Qty: 30 | Fill #0

## 2018-01-27 MED FILL — CLOPIDOGREL 75 MG TABLET: 75 | 30 days supply | Qty: 30 | Fill #0

## 2018-01-27 NOTE — Telephone Encounter (Signed)
Refilled

## 2018-01-27 NOTE — Telephone Encounter (Signed)
Pt aware.

## 2018-02-11 ENCOUNTER — Ambulatory Visit: Payer: Self-pay | Attending: Family Medicine | Admitting: Family Medicine

## 2018-02-11 ENCOUNTER — Encounter: Payer: Self-pay | Admitting: Family Medicine

## 2018-02-11 ENCOUNTER — Other Ambulatory Visit: Payer: Self-pay

## 2018-02-11 VITALS — BP 145/93 | HR 80 | Temp 98.2°F | Resp 16 | Ht 67.0 in | Wt 220.6 lb

## 2018-02-11 DIAGNOSIS — E785 Hyperlipidemia, unspecified: Secondary | ICD-10-CM

## 2018-02-11 DIAGNOSIS — Z79899 Other long term (current) drug therapy: Secondary | ICD-10-CM

## 2018-02-11 DIAGNOSIS — I2510 Atherosclerotic heart disease of native coronary artery without angina pectoris: Secondary | ICD-10-CM

## 2018-02-11 DIAGNOSIS — Z87891 Personal history of nicotine dependence: Secondary | ICD-10-CM | POA: Insufficient documentation

## 2018-02-11 DIAGNOSIS — I10 Essential (primary) hypertension: Secondary | ICD-10-CM

## 2018-02-11 DIAGNOSIS — I252 Old myocardial infarction: Secondary | ICD-10-CM | POA: Insufficient documentation

## 2018-02-11 DIAGNOSIS — Z955 Presence of coronary angioplasty implant and graft: Secondary | ICD-10-CM | POA: Insufficient documentation

## 2018-02-11 DIAGNOSIS — I255 Ischemic cardiomyopathy: Secondary | ICD-10-CM

## 2018-02-11 DIAGNOSIS — I110 Hypertensive heart disease with heart failure: Secondary | ICD-10-CM | POA: Insufficient documentation

## 2018-02-11 DIAGNOSIS — Z7982 Long term (current) use of aspirin: Secondary | ICD-10-CM

## 2018-02-11 DIAGNOSIS — Z7902 Long term (current) use of antithrombotics/antiplatelets: Secondary | ICD-10-CM

## 2018-02-11 DIAGNOSIS — I5022 Chronic systolic (congestive) heart failure: Secondary | ICD-10-CM

## 2018-02-11 MED ORDER — CARVEDILOL 6.25 MG PO TABS: ORAL_TABLET | ORAL | 1 refills | Status: DC

## 2018-02-11 MED ORDER — CLOPIDOGREL BISULFATE 75 MG PO TABS: 75.0000 mg | ORAL_TABLET | Freq: Every day | ORAL | 5 refills | Status: DC

## 2018-02-11 MED ORDER — ATORVASTATIN CALCIUM 80 MG PO TABS: ORAL_TABLET | ORAL | 5 refills | Status: DC

## 2018-02-11 MED ORDER — SPIRONOLACTONE 25 MG PO TABS: 12.5000 mg | ORAL_TABLET | Freq: Every day | ORAL | 5 refills | Status: DC

## 2018-02-11 MED ORDER — ASPIRIN EC 81 MG PO TBEC: 81.0000 mg | DELAYED_RELEASE_TABLET | Freq: Every day | ORAL | 3 refills | Status: DC

## 2018-02-11 MED ORDER — NITROGLYCERIN 0.4 MG SL SUBL: 0.4000 mg | SUBLINGUAL_TABLET | SUBLINGUAL | 5 refills | Status: DC | PRN

## 2018-02-11 MED ORDER — LISINOPRIL 20 MG PO TABS: 20.0000 mg | ORAL_TABLET | Freq: Every day | ORAL | 5 refills | Status: DC

## 2018-02-11 NOTE — Progress Notes (Signed)
Established Patient Office Visit  Subjective:  Patient ID: Jason Foster, male    DOB: 05/27/1960  Age: 58 y.o. MRN: [id number]  CC:  Chief Complaint  Patient presents with  . Establish Care    HPI Jason CorporalMohamed A Foster is a 58 year-old male with a past medical  history of CAD, HTN, CHF, Cardiomypathy who is here to establish care. He is compliant with his medications and denies any recent hospitalizations or ER visits. He is follow by Peter SwazilandJordan MD for his cardiology diseases.   HTN - Complaint with his taking his blood pressure meds, he has not taken his lisinopril and spirolactone this morning. He is a non-smoker. He takes small walks for exercise. Denies chest pain, SOB, speech difficulty and pedal edema.   CHF - (last EF 35-40% Jan.2015) complaint with taking his medications and denies nocturnal dyspnea, SOB, edema, and weight gain.   ACS - not compliant with low fat diet. He takes a high dose statin daily and denies myalgias. He had stenting of the LAD w/DES x2 in January 2015.    Past Medical History:  Diagnosis Date  . Anemia    a. 11/2013 in setting of ulcer/gastritis.  Marland Kitchen. CAD (coronary artery disease)    a. Ant STEMI (01/2013):  LHC - dLM 20, pLAD 100 (DES x 2), CFX < 20, mRCA 100% (L-R collats)  . Chronic systolic CHF (congestive heart failure) (HCC) 10/13/2014  . Duodenal ulcer    a. 11/2013 EGD: medium sized, non-bleeding ulcer @ duodenal bulb.  . Duodenitis without hemorrhage    a. 11/2013 EGD: erosive duodenitis w/o bleeding.  . Erosive gastritis    a. 11/2013 EGD: prepyloric region of stomach and gastric antrum.  Marland Kitchen. HLD (hyperlipidemia)   . Hypertension   . Ischemic cardiomyopathy    a. echo (01/2013):  mild LVH, EF 35-40%, AS and apical AK, PASP 34    Past Surgical History:  Procedure Laterality Date  . CORONARY ANGIOGRAM  02/10/2013   Procedure: CORONARY ANGIOGRAM;  Surgeon: Peter M SwazilandJordan, MD;  [facility];  Service: Cardiovascular;;  . CORONARY  ANGIOPLASTY WITH STENT PLACEMENT  01/2013  . ESOPHAGOGASTRODUODENOSCOPY N/A 11/16/2013   Procedure: ESOPHAGOGASTRODUODENOSCOPY (EGD);  Surgeon: Beverley FiedlerJay M Pyrtle, MD;  [facility];  Service: Endoscopy;  Laterality: N/A;  . PERCUTANEOUS CORONARY STENT INTERVENTION (PCI-S)  02/10/2013   Procedure: PERCUTANEOUS CORONARY STENT INTERVENTION (PCI-S);  Surgeon: Peter M SwazilandJordan, MD;  [facility];  Service: Cardiovascular;;  DES x2 Prox LAD to MID LAD  . PILONIDAL CYST EXCISION  ~ 1995    Family History  Family history unknown: Yes    Social History   Socioeconomic History  . Marital status: Married    Spouse name: Not on file  . Number of children: 3  . Years of education: Not on file  . Highest education level: Not on file  Occupational History  . Occupation: Merchant navy officerCar Salesman    Employer: [employer]  Social Needs  . Financial resource strain: Not on file  . Food insecurity:    Worry: Not on file    Inability: Not on file  . Transportation needs:    Medical: Not on file    Non-medical: Not on file  Tobacco Use  . Smoking status: Former Smoker    Packs/day: 0.50    Years: 34.00    Pack years: 17.00    Types: Cigarettes    Last attempt to quit: 04/13/2012  Years since quitting: 5.8  . Smokeless tobacco: Never Used  Substance and Sexual Activity  . Alcohol use: No  . Drug use: No  . Sexual activity: Never  Lifestyle  . Physical activity:    Days per week: Not on file    Minutes per session: Not on file  . Stress: Not on file  Relationships  . Social connections:    Talks on phone: Not on file    Gets together: Not on file    Attends religious service: Not on file    Active member of club or organization: Not on file    Attends meetings of clubs or organizations: Not on file    Relationship status: Not on file  . Intimate partner violence:    Fear of current or ex partner: Not on file    Emotionally abused: Not on file    Physically abused: Not on  file    Forced sexual activity: Not on file  Other Topics Concern  . Not on file  Social History Narrative  . Not on file    Outpatient Medications Prior to Visit  Medication Sig Dispense Refill  . atorvastatin (LIPITOR) 80 MG tablet TAKE 1 TABLET BY MOUTH DAILY AT 6PM 30 tablet 0  . carvedilol (COREG) 6.25 MG tablet TAKE 1 TABLET BY MOUTH 2 TIMES DAILY WITH A MEAL. 180 tablet 2  . clopidogrel (PLAVIX) 75 MG tablet Take 1 tablet (75 mg total) by mouth daily. 30 tablet 0  . lisinopril (PRINIVIL,ZESTRIL) 20 MG tablet Take 1 tablet (20 mg total) by mouth daily. 30 tablet 0  . nitroGLYCERIN (NITROSTAT) 0.4 MG SL tablet Place 1 tablet (0.4 mg total) under the tongue every 5 (five) minutes as needed for chest pain. 25 tablet 0  . spironolactone (ALDACTONE) 25 MG tablet Take 0.5 tablets (12.5 mg total) by mouth daily. 15 tablet 0  . methocarbamol (ROBAXIN) 500 MG tablet Take 1 tablet (500 mg total) by mouth 2 (two) times daily. (Patient not taking: Reported on 09/24/2017) 20 tablet 0  . aspirin EC 81 MG tablet Take 1 tablet (81 mg total) by mouth daily. (Patient not taking: Reported on 02/11/2018) 90 tablet 3  . carvedilol (COREG) 6.25 MG tablet TAKE 1 TABLET BY MOUTH 2 TIMES DAILY WITH A MEAL. 180 tablet 2   No facility-administered medications prior to visit.     No Known Allergies  ROS Review of Systems  Constitutional: Negative for activity change, chills, fatigue and unexpected weight change.  Eyes: Negative for visual disturbance.  Respiratory: Negative for cough, chest tightness, shortness of breath and wheezing.   Cardiovascular: Negative for chest pain, palpitations and leg swelling.  Gastrointestinal: Negative for abdominal pain, constipation, diarrhea, nausea and vomiting.  Endocrine: Negative for polydipsia, polyphagia and polyuria.  Genitourinary: Negative for dysuria.  Musculoskeletal: Negative for gait problem and myalgias.  Skin: Negative for color change, pallor, rash and  wound.  Neurological: Negative for facial asymmetry, speech difficulty, weakness, numbness and headaches.  Psychiatric/Behavioral: Negative for agitation.      Objective:    Physical Exam  Constitutional: He is oriented to person, place, and time. He appears well-developed and well-nourished.  HENT:  Head: Normocephalic and atraumatic.  Eyes: Pupils are equal, round, and reactive to light. EOM are normal. Right eye exhibits no discharge. Left eye exhibits no discharge.  Neck: Normal range of motion. No thyromegaly present.  Cardiovascular: Normal rate, regular rhythm, normal heart sounds and intact distal pulses. Exam reveals no gallop  and no friction rub.  No murmur heard. Pulmonary/Chest: Effort normal. He has wheezes. He has no rales. He exhibits no tenderness.  Abdominal: Bowel sounds are normal. He exhibits no distension and no mass. There is no abdominal tenderness.  Musculoskeletal: Normal range of motion.        General: No edema.  Neurological: He is alert and oriented to person, place, and time. No cranial nerve deficit.  Skin: Skin is warm and dry.  Vitals reviewed.   BP (!) 145/93 (BP Location: Right Arm, Patient Position: Sitting, Cuff Size: Large)   Pulse 80   Temp 98.2 F (36.8 C) (Oral)   Resp 16   Ht 5\' 7"  (1.702 m)   Wt 100.1 kg   SpO2 97%   BMI 34.55 kg/m  Wt Readings from Last 3 Encounters:  02/11/18 100.1 kg  09/24/17 98 kg  03/09/17 95.9 kg     Health Maintenance Due  Topic Date Due  . Hepatitis C Screening  Dec 26, 1960  . HIV Screening  06/21/1973  . TETANUS/TDAP  06/21/1977  . COLONOSCOPY  06/21/2008    There are no preventive care reminders to display for this patient.  Lab Results  Component Value Date   TSH 1.610 12/03/2016   Lab Results  Component Value Date   WBC 8.1 09/24/2017   HGB 14.1 09/24/2017   HCT 42.1 09/24/2017   MCV 80 09/24/2017   PLT 230 09/24/2017   Lab Results  Component Value Date   NA 139 09/24/2017   K  4.8 09/24/2017   CO2 21 09/24/2017   GLUCOSE 136 (H) 09/24/2017   BUN 16 09/24/2017   CREATININE 1.04 09/24/2017   BILITOT 0.2 09/24/2017   ALKPHOS 114 09/24/2017   AST 20 09/24/2017   ALT 19 09/24/2017   PROT 7.6 09/24/2017   ALBUMIN 4.4 09/24/2017   CALCIUM 9.3 09/24/2017   ANIONGAP 11 11/15/2013   GFR 84.48 03/17/2013   Lab Results  Component Value Date   CHOL 155 09/24/2017   Lab Results  Component Value Date   HDL 27 (L) 09/24/2017   Lab Results  Component Value Date   LDLCALC 66 09/24/2017   Lab Results  Component Value Date   TRIG 310 (H) 09/24/2017   Lab Results  Component Value Date   CHOLHDL 6.2 (H) 12/03/2016   Lab Results  Component Value Date   HGBA1C 5.60 07/13/2014      Assessment & Plan:  1. Hypertension     Uncontrolled     - Patient did not take his blood pressure medication this morning. Continue current medication regimen of Lisinopril and Spironolactone.    - DASH Diet discussed as well as benefits of weight lost on HTN.  2. CAD/Dyslipidemia     Uncontrolled     - Low fat diet discussed.     - Continue statins regimen.     - Future Lipid Panel  3. CHF/Ischemic Cardiomyopathy     Euvolemic     Last EF 35-40% Jan.2015    - continue current medication regimen,   - Low sodium diet/ lifestyle modifications reinforced.  4. Long-term use of high risk medication     - Obtain CMP + GFR     - Obtain CBC.  5. Long-term use of aspirin therapy/Longterm use of antithrombotics/antiplatelets      - Obtain CBC.      - Discussed to watch for signs and symptoms of bleeding.   Problem List Items Addressed This Visit  Cardiovascular and Mediastinum   Hypertension   Relevant Medications   spironolactone (ALDACTONE) 25 MG tablet   nitroGLYCERIN (NITROSTAT) 0.4 MG SL tablet   lisinopril (PRINIVIL,ZESTRIL) 20 MG tablet   carvedilol (COREG) 6.25 MG tablet   atorvastatin (LIPITOR) 80 MG tablet   aspirin EC 81 MG tablet   Other Relevant  Orders   Lipid panel   CAD (coronary artery disease) - Primary   Relevant Medications   spironolactone (ALDACTONE) 25 MG tablet   nitroGLYCERIN (NITROSTAT) 0.4 MG SL tablet   lisinopril (PRINIVIL,ZESTRIL) 20 MG tablet   clopidogrel (PLAVIX) 75 MG tablet   carvedilol (COREG) 6.25 MG tablet   atorvastatin (LIPITOR) 80 MG tablet   aspirin EC 81 MG tablet   Other Relevant Orders   Comprehensive metabolic panel   Lipid panel   CBC with Differential   Ischemic cardiomyopathy   Relevant Medications   spironolactone (ALDACTONE) 25 MG tablet   nitroGLYCERIN (NITROSTAT) 0.4 MG SL tablet   lisinopril (PRINIVIL,ZESTRIL) 20 MG tablet   carvedilol (COREG) 6.25 MG tablet   atorvastatin (LIPITOR) 80 MG tablet   aspirin EC 81 MG tablet   Chronic systolic CHF (congestive heart failure) (HCC)   Relevant Medications   spironolactone (ALDACTONE) 25 MG tablet   nitroGLYCERIN (NITROSTAT) 0.4 MG SL tablet   lisinopril (PRINIVIL,ZESTRIL) 20 MG tablet   carvedilol (COREG) 6.25 MG tablet   atorvastatin (LIPITOR) 80 MG tablet   aspirin EC 81 MG tablet     Other   Dyslipidemia   Relevant Medications   nitroGLYCERIN (NITROSTAT) 0.4 MG SL tablet   atorvastatin (LIPITOR) 80 MG tablet    Other Visit Diagnoses    Long-term use of high-risk medication       Relevant Orders   Comprehensive metabolic panel   Long-term use of aspirin therapy       Relevant Orders   CBC with Differential   Long term (current) use of antithrombotics/antiplatelets       Relevant Orders   CBC with Differential      Meds ordered this encounter  Medications  . spironolactone (ALDACTONE) 25 MG tablet    Sig: Take 0.5 tablets (12.5 mg total) by mouth daily.    Dispense:  15 tablet    Refill:  5  . nitroGLYCERIN (NITROSTAT) 0.4 MG SL tablet    Sig: Place 1 tablet (0.4 mg total) under the tongue every 5 (five) minutes as needed for chest pain.    Dispense:  25 tablet    Refill:  5  . lisinopril (PRINIVIL,ZESTRIL) 20 MG  tablet    Sig: Take 1 tablet (20 mg total) by mouth daily.    Dispense:  30 tablet    Refill:  5  . clopidogrel (PLAVIX) 75 MG tablet    Sig: Take 1 tablet (75 mg total) by mouth daily.    Dispense:  30 tablet    Refill:  5  . carvedilol (COREG) 6.25 MG tablet    Sig: TAKE 1 TABLET BY MOUTH 2 TIMES DAILY WITH A MEAL.    Dispense:  180 tablet    Refill:  1  . atorvastatin (LIPITOR) 80 MG tablet    Sig: TAKE 1 TABLET BY MOUTH DAILY AT 6PM to lower cholesterol    Dispense:  30 tablet    Refill:  5  . aspirin EC 81 MG tablet    Sig: Take 1 tablet (81 mg total) by mouth daily.    Dispense:  90 tablet  Refill:  3    Follow-up: Return in about 6 months (around 08/12/2018) for cardiac issues but needs fasting labs in 1 week.    Stanford Breed, RN

## 2018-02-11 NOTE — Progress Notes (Signed)
Re establish care

## 2018-02-11 NOTE — Progress Notes (Signed)
   Subjective:    Patient ID: Jason Foster, male    DOB: 05-09-1960, 58 y.o.   MRN: [id number]  HPI    Review of Systems     Objective:   Physical Exam        Assessment & Plan:

## 2018-02-18 ENCOUNTER — Ambulatory Visit: Payer: Medicaid Other | Attending: Family Medicine

## 2018-02-18 DIAGNOSIS — Z7982 Long term (current) use of aspirin: Secondary | ICD-10-CM

## 2018-02-18 DIAGNOSIS — Z79899 Other long term (current) drug therapy: Secondary | ICD-10-CM

## 2018-02-18 DIAGNOSIS — I2510 Atherosclerotic heart disease of native coronary artery without angina pectoris: Secondary | ICD-10-CM

## 2018-02-18 DIAGNOSIS — I10 Essential (primary) hypertension: Secondary | ICD-10-CM

## 2018-02-18 DIAGNOSIS — Z7902 Long term (current) use of antithrombotics/antiplatelets: Secondary | ICD-10-CM

## 2018-02-19 LAB — COMPREHENSIVE METABOLIC PANEL WITH GFR
ALT: 23 IU/L (ref 0–44)
AST: 22 IU/L (ref 0–40)
Albumin/Globulin Ratio: 1.3 (ref 1.2–2.2)
Albumin: 4.3 g/dL (ref 3.8–4.9)
Alkaline Phosphatase: 97 IU/L (ref 39–117)
BUN/Creatinine Ratio: 15 (ref 9–20)
BUN: 14 mg/dL (ref 6–24)
Bilirubin Total: 0.4 mg/dL (ref 0.0–1.2)
CO2: 22 mmol/L (ref 20–29)
Calcium: 9.3 mg/dL (ref 8.7–10.2)
Chloride: 99 mmol/L (ref 96–106)
Creatinine, Ser: 0.92 mg/dL (ref 0.76–1.27)
GFR calc Af Amer: 105 mL/min/1.73
GFR calc non Af Amer: 91 mL/min/1.73
Globulin, Total: 3.4 g/dL (ref 1.5–4.5)
Glucose: 112 mg/dL — ABNORMAL HIGH (ref 65–99)
Potassium: 4.8 mmol/L (ref 3.5–5.2)
Sodium: 137 mmol/L (ref 134–144)
Total Protein: 7.7 g/dL (ref 6.0–8.5)

## 2018-02-19 LAB — CBC WITH DIFFERENTIAL/PLATELET
Basophils Absolute: 0.1 x10E3/uL (ref 0.0–0.2)
Basos: 1 %
EOS (ABSOLUTE): 0.6 x10E3/uL — ABNORMAL HIGH (ref 0.0–0.4)
Eos: 7 %
Hematocrit: 43.4 % (ref 37.5–51.0)
Hemoglobin: 13.7 g/dL (ref 13.0–17.7)
Immature Grans (Abs): 0 x10E3/uL (ref 0.0–0.1)
Immature Granulocytes: 0 %
Lymphocytes Absolute: 3.5 x10E3/uL — ABNORMAL HIGH (ref 0.7–3.1)
Lymphs: 39 %
MCH: 25.7 pg — ABNORMAL LOW (ref 26.6–33.0)
MCHC: 31.6 g/dL (ref 31.5–35.7)
MCV: 81 fL (ref 79–97)
Monocytes Absolute: 0.6 x10E3/uL (ref 0.1–0.9)
Monocytes: 7 %
Neutrophils Absolute: 4 x10E3/uL (ref 1.4–7.0)
Neutrophils: 46 %
Platelets: 224 x10E3/uL (ref 150–450)
RBC: 5.33 x10E6/uL (ref 4.14–5.80)
RDW: 13.8 % (ref 11.6–15.4)
WBC: 8.8 x10E3/uL (ref 3.4–10.8)

## 2018-02-19 LAB — LIPID PANEL
Chol/HDL Ratio: 5.8 ratio — ABNORMAL HIGH (ref 0.0–5.0)
Cholesterol, Total: 185 mg/dL (ref 100–199)
HDL: 32 mg/dL — ABNORMAL LOW
LDL Calculated: 113 mg/dL — ABNORMAL HIGH (ref 0–99)
Triglycerides: 202 mg/dL — ABNORMAL HIGH (ref 0–149)
VLDL Cholesterol Cal: 40 mg/dL (ref 5–40)

## 2018-02-26 ENCOUNTER — Telehealth: Payer: Self-pay | Admitting: *Deleted

## 2018-02-26 NOTE — Telephone Encounter (Signed)
-----   Message from Cain Saupe, MD sent at 02/25/2018 10:55 PM EST ----- Normal CMP if non-fasting. Glucose of 112

## 2018-02-26 NOTE — Telephone Encounter (Signed)
Patient verified DOB Patient is aware of labs being normal and cholesterol improving. Patient advised to continue with low fat diet and current regimen with a follow up as planned.

## 2018-03-25 MED FILL — ATORVASTATIN 80 MG TABLET: 80 | 90 days supply | Qty: 90 | Fill #0

## 2018-03-25 MED FILL — CLOPIDOGREL 75 MG TABLET: 75 | 90 days supply | Qty: 90 | Fill #0

## 2018-03-25 MED FILL — SPIRONOLACTONE 25 MG TABLET: 25 | 90 days supply | Qty: 45 | Fill #0

## 2018-03-25 MED FILL — LISINOPRIL 20 MG TAB: 20 | 90 days supply | Qty: 90 | Fill #0

## 2018-03-26 ENCOUNTER — Encounter: Payer: Self-pay | Admitting: Cardiology

## 2018-04-02 ENCOUNTER — Telehealth: Payer: Self-pay

## 2018-04-02 ENCOUNTER — Telehealth: Payer: Self-pay | Admitting: Cardiology

## 2018-04-02 NOTE — Telephone Encounter (Signed)
New message   Pt is calling to speak with Elnita Maxwell   Please call back

## 2018-04-02 NOTE — Telephone Encounter (Signed)
Already spoke to patient see previous telephone note. 

## 2018-04-02 NOTE — Telephone Encounter (Signed)
   Primary Cardiologist: Dr.Jordan   Patient contacted.  History reviewed.  No symptoms to suggest any unstable cardiac conditions.  Based on discussion, with current pandemic situation, we will be postponing this 04/06/18 appointment for [facility].  If symptoms change, he has been instructed to contact our office.   Appointment rescheduled to 06/21/18 at 2:00 pm.  Neoma Laming, LPN  03/13/3141 8:88 PM         .

## 2018-04-06 ENCOUNTER — Ambulatory Visit: Payer: Self-pay | Admitting: Cardiology
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
## 2018-04-30 MED FILL — NITROGLYCERIN 0.4 MG TAB SL: 0.4 | 10 days supply | Qty: 25 | Fill #0

## 2018-06-21 ENCOUNTER — Ambulatory Visit: Payer: Medicaid Other | Admitting: Cardiology

## 2018-07-27 ENCOUNTER — Telehealth: Payer: Self-pay | Admitting: Cardiology

## 2018-07-27 NOTE — Progress Notes (Signed)
Jason Foster Date of Birth: 1960/02/21 Medical Record #[id number]  History of Present Illness: Jason Foster is seen for follow up CAD. He is s/p anterior STEMI on 02/10/13 treated with DES x 2 of the LAD. ( 3.0x 38 mm Promus and 3.5 x 32 Promus) He has a history of HTN and hyperlipidemia. He is a former smoker. On emergent cardiac cath he had occlusion of the proximal LAD. The RCA was also occluded in the mid vessel but this appeared chronic and was collateralized. EF was 35-40%. In November 2015 he was admitted with an UGI bleed. He was found to have a gastritis/duodenitis and duodenal ulcer. He was treated with PPI and Hgb later normalized.   On follow up today he is feeling  well. Marland Kitchen He denies any chest pain or SOB. No edema or palpitations. He is taking his medication regularly.  He is trying to eat healthy.  Current Outpatient Medications on File Prior to Visit  Medication Sig Dispense Refill  . aspirin EC 81 MG tablet Take 1 tablet (81 mg total) by mouth daily. 90 tablet 3  . atorvastatin (LIPITOR) 80 MG tablet TAKE 1 TABLET BY MOUTH DAILY AT 6PM to lower cholesterol 30 tablet 5  . carvedilol (COREG) 6.25 MG tablet TAKE 1 TABLET BY MOUTH 2 TIMES DAILY WITH A MEAL. 180 tablet 1  . clopidogrel (PLAVIX) 75 MG tablet Take 1 tablet (75 mg total) by mouth daily. 30 tablet 5  . lisinopril (PRINIVIL,ZESTRIL) 20 MG tablet Take 1 tablet (20 mg total) by mouth daily. 30 tablet 5  . methocarbamol (ROBAXIN) 500 MG tablet Take 1 tablet (500 mg total) by mouth 2 (two) times daily. (Patient not taking: Reported on 09/24/2017) 20 tablet 0  . nitroGLYCERIN (NITROSTAT) 0.4 MG SL tablet Place 1 tablet (0.4 mg total) under the tongue every 5 (five) minutes as needed for chest pain. 25 tablet 5  . spironolactone (ALDACTONE) 25 MG tablet Take 0.5 tablets (12.5 mg total) by mouth daily. 15 tablet 5   No current facility-administered medications on file prior to visit.     No Known Allergies  Past Medical  History:  Diagnosis Date  . Anemia    a. 11/2013 in setting of ulcer/gastritis.  Marland Kitchen CAD (coronary artery disease)    a. Ant STEMI (01/2013):  LHC - dLM 20, pLAD 100 (DES x 2), CFX < 20, mRCA 100% (L-R collats)  . Chronic systolic CHF (congestive heart failure) (HCC) 10/13/2014  . Duodenal ulcer    a. 11/2013 EGD: medium sized, non-bleeding ulcer @ duodenal bulb.  . Duodenitis without hemorrhage    a. 11/2013 EGD: erosive duodenitis w/o bleeding.  . Erosive gastritis    a. 11/2013 EGD: prepyloric region of stomach and gastric antrum.  Marland Kitchen HLD (hyperlipidemia)   . Hypertension   . Ischemic cardiomyopathy    a. echo (01/2013):  mild LVH, EF 35-40%, AS and apical AK, PASP 34    Past Surgical History:  Procedure Laterality Date  . CORONARY ANGIOGRAM  02/10/2013   Procedure: CORONARY ANGIOGRAM;  Surgeon: Naveyah Iacovelli M Swazil[place], MD;  Location: [facility] CATH LAB;  Service: Cardiovascular;;  . CORONARY ANGIOPLASTY WITH STENT PLACEMENT  01/2013  . ESOPHAGOGASTRODUODENOSCOPY N/A 11/16/2013   Procedure: ESOPHAGOGASTRODUODENOSCOPY (EGD);  Surgeon: Beverley Fiedler, MD;  Location: [facility] ENDOSCOPY;  Service: Endoscopy;  Laterality: N/A;  . PERCUTANEOUS CORONARY STENT INTERVENTION (PCI-S)  02/10/2013   Procedure: PERCUTANEOUS CORONARY STENT INTERVENTION (PCI-S);  Surgeon: Letita Prentiss M [place], MD;  Location: [facility] CATH  LAB;  Service: Cardiovascular;;  DES x2 Prox LAD to MID LAD  . PILONIDAL CYST EXCISION  ~ 1995    Social History   Tobacco Use  Smoking Status Former Smoker  . Packs/day: 0.50  . Years: 34.00  . Pack years: 17.00  . Types: Cigarettes  . Quit date: 04/13/2012  . Years since quitting: 6.2  Smokeless Tobacco Never Used    Social History   Substance and Sexual Activity  Alcohol Use No    Family History  Family history unknown: Yes    Review of Systems: As noted in HPI.  All other systems were reviewed and are negative.  Physical Exam: BP 126/84   Pulse 73   Temp 100.3 F (37.9 C) (Temporal)   Ht 5\' 7"   (1.702 m)   Wt 224 lb 9.6 oz (101.9 kg)   SpO2 97%   BMI 35.18 kg/m  GENERAL:  Well appearing male in NAD HEENT:  PERRL, EOMI, sclera are clear. Oropharynx is clear. NECK:  No jugular venous distention, carotid upstroke brisk and symmetric, no bruits, no thyromegaly or adenopathy LUNGS:  Clear to auscultation bilaterally CHEST:  Unremarkable HEART:  RRR,  PMI not displaced or sustained,S1 and S2 within normal limits, no S3, no S4: no clicks, no rubs, no murmurs ABD:  Soft, nontender. BS +, no masses or bruits. No hepatomegaly, no splenomegaly EXT:  2 + pulses throughout, no edema, no cyanosis no clubbing SKIN:  Warm and dry.  No rashes NEURO:  Alert and oriented x 3. Cranial nerves II through XII intact. PSYCH:  Cognitively intact   LABORATORY DATA: Lab Results  Component Value Date   WBC 8.8 02/18/2018   HGB 13.7 02/18/2018   HCT 43.4 02/18/2018   PLT 224 02/18/2018   GLUCOSE 112 (H) 02/18/2018   CHOL 185 02/18/2018   TRIG 202 (H) 02/18/2018   HDL 32 (L) 02/18/2018   LDLCALC 113 (H) 02/18/2018   ALT 23 02/18/2018   AST 22 02/18/2018   NA 137 02/18/2018   K 4.8 02/18/2018   CL 99 02/18/2018   CREATININE 0.92 02/18/2018   BUN 14 02/18/2018   CO2 22 02/18/2018   TSH 1.610 12/03/2016   INR 1.26 11/14/2013   HGBA1C 5.60 07/13/2014   Ecg today shows NSR with old anteroseptal infarct. Unchanged from prior. I have personally reviewed and interpreted this study.   Assessment/plan:   1. CAD s/p anterior MI with stenting of the LAD with DES x 2 in January 2015. Given extensive stenting in the LAD I would prefer long term DAPT.  Will treat chronic total occlusion of RCA medically.  He is asymptomatic. Will focus on lifestyle modification with regular aerobic exercise, weight loss and diet.   2. Ischemic cardiomyopathy. EF 35-40% initially with MI. EF improved to normal 2 months later. No evidence of volume overload.  Continue Coreg, lisinopril, aldactone.   3. HTN  controlled.  4. Hyperlipidemia on high dose statin. LDL is not at goal of <70. Will add Zetia 10 mg daily. On high dose lipitor.  Reinforced compliance. Stressed importance of lifestyle modification. Check fasting lab work in 3 months

## 2018-07-27 NOTE — Telephone Encounter (Signed)
I called pt to confirm his apt for 07-28-18 with Dr Martin[place].     Pt hung up, before he would let me ask him pre-screening questions for COVID-19.

## 2018-07-28 ENCOUNTER — Encounter: Payer: Self-pay | Admitting: Cardiology

## 2018-07-28 ENCOUNTER — Ambulatory Visit (INDEPENDENT_AMBULATORY_CARE_PROVIDER_SITE_OTHER): Payer: Medicaid Other | Admitting: Cardiology

## 2018-07-28 ENCOUNTER — Other Ambulatory Visit: Payer: Self-pay

## 2018-07-28 VITALS — BP 126/84 | HR 73 | Temp 100.3°F | Ht 67.0 in | Wt 224.6 lb

## 2018-07-28 DIAGNOSIS — E785 Hyperlipidemia, unspecified: Secondary | ICD-10-CM

## 2018-07-28 DIAGNOSIS — I2510 Atherosclerotic heart disease of native coronary artery without angina pectoris: Secondary | ICD-10-CM

## 2018-07-28 MED ORDER — EZETIMIBE 10 MG PO TABS: 10.0000 mg | ORAL_TABLET | Freq: Every day | ORAL | 3 refills | Status: DC

## 2018-07-28 MED FILL — CLOPIDOGREL 75 MG TABLET: 75 | 90 days supply | Qty: 90 | Fill #1

## 2018-07-28 MED FILL — CARVEDILOL 6.25 MG TABLET: 6.25 | 90 days supply | Qty: 180 | Fill #0

## 2018-07-28 MED FILL — LISINOPRIL 20 MG TABLET: 20 | 90 days supply | Qty: 90 | Fill #1

## 2018-07-28 MED FILL — SPIRONOLACTONE 25 MG TABLET: 25 | 90 days supply | Qty: 45 | Fill #1

## 2018-07-28 MED FILL — EZETIMIBE 10 MG TAB: 10 | 30 days supply | Qty: 30 | Fill #0

## 2018-07-28 MED FILL — ATORVASTATIN 80 MG TABLET: 80 | 90 days supply | Qty: 90 | Fill #1

## 2018-07-28 NOTE — Patient Instructions (Signed)
We will add Zetia 10 mg daily   Repeat cholesterol check in 3 months  Follow up in 6 months

## 2018-07-28 NOTE — Addendum Note (Signed)
Addended by: Kathyrn Lass on: 07/28/2018 02:49 PM   Modules accepted: Orders

## 2018-09-14 ENCOUNTER — Encounter

## 2018-10-19 ENCOUNTER — Other Ambulatory Visit: Payer: Self-pay | Admitting: Family Medicine

## 2018-10-19 DIAGNOSIS — I2510 Atherosclerotic heart disease of native coronary artery without angina pectoris: Secondary | ICD-10-CM

## 2018-10-19 DIAGNOSIS — I5022 Chronic systolic (congestive) heart failure: Secondary | ICD-10-CM

## 2018-10-29 MED FILL — ATORVASTATIN 80 MG TABLET: 80 | 90 days supply | Qty: 90 | Fill #1

## 2018-10-29 MED FILL — CARVEDILOL 6.25 MG TABLET: 6.25 | 90 days supply | Qty: 180 | Fill #1

## 2018-10-29 MED FILL — LISINOPRIL 20 MG TABLET: 20 | 90 days supply | Qty: 90 | Fill #0

## 2018-10-29 MED FILL — CLOPIDOGREL 75 MG TABLET: 75 | 90 days supply | Qty: 90 | Fill #0

## 2018-10-29 MED FILL — SPIRONOLACTONE 25 MG TABLET: 25 | 30 days supply | Qty: 15 | Fill #0

## 2018-10-29 MED FILL — EZETIMIBE 10 MG TAB: 10 | 30 days supply | Qty: 30 | Fill #1

## 2019-03-08 NOTE — Progress Notes (Signed)
Cardiology Office Note  Date: 03/09/2019   ID: Jason Foster, DOB December 12, 1960, MRN [id number]  PCP:  Quentin Angst, MD  Cardiologist:  Peter Swaziland, MD Electrophysiologist:  None   Chief Complaint  Patient presents with  . Follow-up    CAD ischemic cardiomyopathy, HTN, HLD    History of Present Illness: Jason Foster is a 59 y.o. male with a history of anterior STEMI 2015 (DES x2 to LAD). CTO of RCA mid vessel with collaterals.  PMH: HTN, HLD, former smoker, GI bleed 2015 (gastritis/duodenitis and duodenal ulcer) started on PPI. 1/29/ 2015 EF 35-40%. ECHO 04/04/2013 EF 55-60%. Grade 1 DD. EF improved.  LHC 2015 dLM 20, pLAD 100 DES x 2, Lcx <20, mRCA L-R collaterals  He denies any recent acute illnesses, hospitalizations, or surgeries, or travels.  No complaints of exposure to Covid virus or Covid-like symptoms.  He denies any progressive anginal or exertional symptoms, palpitations or arrhythmias, orthostatic symptoms/syncope/near syncopal episodes.  No CVA or TIA-like symptoms, dyspeptic symptoms, blood in stool or urine.  Denies any claudication-like symptoms, DVT or PE-like symptoms, or lower extremity edema.  He is compliant with all of his cardiac medications.  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  Past Medical History:  Diagnosis Date  . Anemia    a. 11/2013 in setting of ulcer/gastritis.  Marland Kitchen CAD (coronary artery disease)    a. Ant STEMI (01/2013):  LHC - dLM 20, pLAD 100 (DES x 2), CFX < 20, mRCA 100% (L-R collats)  . Chronic systolic CHF (congestive heart failure) (HCC) 10/13/2014  . Duodenal ulcer    a. 11/2013 EGD: medium sized, non-bleeding ulcer @ duodenal bulb.  . Duodenitis without hemorrhage    a. 11/2013 EGD: erosive duodenitis w/o bleeding.  . Erosive gastritis    a. 11/2013 EGD: prepyloric region of stomach and gastric antrum.  Marland Kitchen HLD (hyperlipidemia)   . Hypertension   . Ischemic cardiomyopathy    a. echo (01/2013):  mild  LVH, EF 35-40%, AS and apical AK, PASP 34    Past Surgical History:  Procedure Laterality Date  . CORONARY ANGIOGRAM  02/10/2013   Procedure: CORONARY ANGIOGRAM;  Surgeon: Peter M Swaziland, MD;  Location: [facility] CATH LAB;  Service: Cardiovascular;;  . CORONARY ANGIOPLASTY WITH STENT PLACEMENT  01/2013  . ESOPHAGOGASTRODUODENOSCOPY N/A 11/16/2013   Procedure: ESOPHAGOGASTRODUODENOSCOPY (EGD);  Surgeon: Beverley Fiedler, MD;  Location: [facility] ENDOSCOPY;  Service: Endoscopy;  Laterality: N/A;  . PERCUTANEOUS CORONARY STENT INTERVENTION (PCI-S)  02/10/2013   Procedure: PERCUTANEOUS CORONARY STENT INTERVENTION (PCI-S);  Surgeon: Peter M Swaziland, MD;  Location: [facility] CATH LAB;  Service: Cardiovascular;;  DES x2 Prox LAD to MID LAD  . PILONIDAL CYST EXCISION  ~ 1995    Current Outpatient Medications  Medication Sig Dispense Refill  . aspirin EC 81 MG tablet Take 1 tablet (81 mg total) by mouth daily. 90 tablet 3  . atorvastatin (LIPITOR) 80 MG tablet TAKE 1 TABLET BY MOUTH DAILY AT 6PM to lower cholesterol 30 tablet 5  . carvedilol (COREG) 6.25 MG tablet TAKE 1 TABLET BY MOUTH 2 TIMES DAILY WITH A MEAL. 180 tablet 1  . clopidogrel (PLAVIX) 75 MG tablet Take 1 tablet (75 mg total) by mouth daily. Must have office visit for refills 90 tablet 0  . ezetimibe (ZETIA) 10 MG tablet Take 1 tablet (10 mg total) by mouth daily. 90 tablet 3  . lisinopril (ZESTRIL) 20 MG tablet Take 1  tablet (20 mg total) by mouth daily. Must have office visit for refills 90 tablet 0  . methocarbamol (ROBAXIN) 500 MG tablet Take 1 tablet (500 mg total) by mouth 2 (two) times daily. (Patient not taking: Reported on 09/24/2017) 20 tablet 0  . nitroGLYCERIN (NITROSTAT) 0.4 MG SL tablet Place 1 tablet (0.4 mg total) under the tongue every 5 (five) minutes as needed for chest pain. 25 tablet 5  . spironolactone (ALDACTONE) 25 MG tablet Take 0.5 tablets (12.5 mg total) by mouth daily. Must have office visit for refills 45 tablet 0   No current  facility-administered medications for this visit.   Allergies:  Patient has no known allergies.   Social History: The patient  reports that he quit smoking about 6 years ago. His smoking use included cigarettes. He has a 17.00 pack-year smoking history. He has never used smokeless tobacco. He reports that he does not drink alcohol or use drugs.   Family History: The patient's Family history is unknown by patient.   ROS:  Please see the history of present illness. Otherwise, complete review of systems is positive for none.  All other systems are reviewed and negative.   Physical Exam: VS:  BP 118/76   Pulse 74   Temp (!) 97.3 F (36.3 C)   Ht 5\' 7"  (1.702 m)   Wt 220 lb (99.8 kg)   SpO2 96%   BMI 34.46 kg/m , BMI Body mass index is 34.46 kg/m.  Wt Readings from Last 3 Encounters:  03/09/19 220 lb (99.8 kg)  07/28/18 224 lb 9.6 oz (101.9 kg)  02/11/18 220 lb 9.6 oz (100.1 kg)    General: Patient appears comfortable at rest. Neck: Supple, no elevated JVP or carotid bruits, no thyromegaly. Lungs: Clear to auscultation, nonlabored breathing at rest. Cardiac: Regular rate and rhythm, no S3 or significant systolic murmur, no pericardial rub. Extremities: No pitting edema, distal pulses 2+. Skin: Warm and dry. Musculoskeletal: No kyphosis. Neuropsychiatric: Alert and oriented x3, affect grossly appropriate.  ECG:  An ECG dated 03/09/2019 was personally reviewed today and demonstrated:  Normal sinus rhythm rate of 74, inferior infarct, age undetermined, anteroseptal infarct, age undetermined.  Poor R wave progression, Q's in inferior leads  Recent Labwork: No results found for requested labs within last 8760 hours.     Component Value Date/Time   CHOL 185 02/18/2018 1204   TRIG 202 (H) 02/18/2018 1204   HDL 32 (L) 02/18/2018 1204   CHOLHDL 5.8 (H) 02/18/2018 1204   CHOLHDL 5.8 (H) 01/03/2016 1001   VLDL 35 (H) 01/03/2016 1001   LDLCALC 113 (H) 02/18/2018 1204    Other Studies  Reviewed Today:  Echocardiogram 04/04/2013 Study Conclusions   - Left ventricle: There is mid and basal anterior, mid  anteroseptal and distal septal wall hypokinesis. Overal  LVEF is preserved. The cavity size was normal. Systolic  function was normal. The estimated ejection fraction was  in the range of 55% to 60%. Doppler parameters are  consistent with abnormal left ventricular relaxation  (grade 1 diastolic dysfunction). There was no evidence of  elevated ventricular filling pressure by Doppler  parameters.  - Aortic valve: Trileaflet; mildly thickened, mildly  calcified leaflets. Transvalvular velocity was within the  normal range. There was no stenosis. No regurgitation.  - Aortic root: The aortic root was normal in size.  - Mitral valve: No regurgitation.  - Right ventricle: Systolic function was normal.  - Pulmonic valve: Trivial regurgitation.  - Pulmonary arteries: Systolic  pressure was within the  normal range.  - Pericardium, extracardiac: There was no pericardial  effusion.  Impressions:  - Compared to the prior study LVEF has improved and is now  normal.   Assessment and Plan:  1. CAD in native artery   2. Cardiomyopathy, ischemic   3. Essential hypertension, benign   4. Mixed hyperlipidemia    1. CAD in native artery LHC 2015 dLM 20, pLAD 100 DES x 2, Lcx <20, mRCA L-R collaterals.  No current progressive anginal or exertional symptoms.  Continue aspirin 81 daily, Plavix 75 daily, carvedilol 2 5 mg p.o. twice daily, sublingual nitroglycerin 0.4 mg as needed  2. Cardiomyopathy, ischemic 02/10/2013 ECHO EF 35-40%. ECHO 04/04/2013 EF 55-60%. Grade 1 DD. EF improved.  He denies any increasing weight gain, dyspnea on exertion, lower extremity edema.  3. Essential hypertension, benign Blood pressure 118/76 on arrival today.  Continue lisinopril 20 mg daily and spironolactone 25 mg daily.   4. Mixed hyperlipidemia.  Last FLP February 18, 2018 TC 185, triglycerides 202, HDL 32, VLDL 40, LDL 113 on atorvastatin 80 mg daily and Zetia 10 mg daily.  May benefit from PCSK9's if LDL remains above goal of less than 70.  May refer to lipid clinic in the future.  Patient states he would like to Dr. Swaziland about this at his next visit.  Medication Adjustments/Labs and Tests Ordered: Current medicines are reviewed at length with the patient today.  Concerns regarding medicines are outlined above.   Disposition: Follow-up with Dr. Swaziland in 6 months.  Signed, Rennis Harding, NP 03/09/2019 8:47 AM    [facility]

## 2019-03-09 ENCOUNTER — Ambulatory Visit: Payer: Medicaid Other | Admitting: Physician Assistant

## 2019-03-09 ENCOUNTER — Other Ambulatory Visit: Payer: Self-pay

## 2019-03-09 ENCOUNTER — Encounter (INDEPENDENT_AMBULATORY_CARE_PROVIDER_SITE_OTHER): Payer: Self-pay

## 2019-03-09 ENCOUNTER — Encounter: Payer: Self-pay | Admitting: Physician Assistant

## 2019-03-09 VITALS — BP 118/76 | HR 74 | Temp 97.3°F | Ht 67.0 in | Wt 220.0 lb

## 2019-03-09 DIAGNOSIS — I2510 Atherosclerotic heart disease of native coronary artery without angina pectoris: Secondary | ICD-10-CM

## 2019-03-09 DIAGNOSIS — I255 Ischemic cardiomyopathy: Secondary | ICD-10-CM

## 2019-03-09 DIAGNOSIS — I10 Essential (primary) hypertension: Secondary | ICD-10-CM

## 2019-03-09 DIAGNOSIS — E782 Mixed hyperlipidemia: Secondary | ICD-10-CM

## 2019-03-09 NOTE — Patient Instructions (Signed)
Medication Instructions:  Your physician recommends that you continue on your current medications as directed. Please refer to the Current Medication list given to you today.  *If you need a refill on your cardiac medications before your next appointment, please call your pharmacy*  Lab Work: FASTING lipid panel to check cholesterol before next appointment with Dr. Swaziland   If you have labs (blood work) drawn today and your tests are completely normal, you will receive your results only by: Marland Kitchen MyChart Message (if you have MyChart) OR . A paper copy in the mail If you have any lab test that is abnormal or we need to change your treatment, we will call you to review the results.  Testing/Procedures: NONE  Follow-Up: At [facility], you and your health needs are our priority.  As part of our continuing mission to provide you with exceptional heart care, we have created designated Provider Care Teams.  These Care Teams include your primary Cardiologist (physician) and Advanced Practice Providers (APPs -  Physician Assistants and Nurse Practitioners) who all work together to provide you with the care you need, when you need it.  Your next appointment:   6 month(s)  The format for your next appointment:   In Person  Provider:   Peter Swaziland, MD  Other Instructions

## 2019-03-14 NOTE — Addendum Note (Signed)
Addended by: Chana Bode on: 03/14/2019 12:08 PM   Modules accepted: Orders

## 2019-03-15 ENCOUNTER — Other Ambulatory Visit: Payer: Self-pay | Admitting: Family Medicine

## 2019-03-15 DIAGNOSIS — I5022 Chronic systolic (congestive) heart failure: Secondary | ICD-10-CM

## 2019-03-15 DIAGNOSIS — E785 Hyperlipidemia, unspecified: Secondary | ICD-10-CM

## 2019-03-15 DIAGNOSIS — I2510 Atherosclerotic heart disease of native coronary artery without angina pectoris: Secondary | ICD-10-CM

## 2019-03-15 MED FILL — EZETIMIBE 10 MG TAB: 10 | 30 days supply | Qty: 30 | Fill #2

## 2019-03-15 MED FILL — SPIRONOLACTONE 25 MG TABLET: 25 | 30 days supply | Qty: 15 | Fill #1

## 2019-03-23 ENCOUNTER — Other Ambulatory Visit: Payer: Self-pay | Admitting: Family Medicine

## 2019-03-23 DIAGNOSIS — E785 Hyperlipidemia, unspecified: Secondary | ICD-10-CM

## 2019-03-23 DIAGNOSIS — I2510 Atherosclerotic heart disease of native coronary artery without angina pectoris: Secondary | ICD-10-CM

## 2019-03-23 DIAGNOSIS — I5022 Chronic systolic (congestive) heart failure: Secondary | ICD-10-CM

## 2019-03-23 MED FILL — CLOPIDOGREL 75 MG TABLET: 75 | 30 days supply | Qty: 30 | Fill #0

## 2019-03-23 MED FILL — ATORVASTATIN 80 MG TABLET: 80 | 30 days supply | Qty: 30 | Fill #0

## 2019-03-23 MED FILL — LISINOPRIL 20 MG TABLET: 20 | 30 days supply | Qty: 30 | Fill #0

## 2019-03-25 ENCOUNTER — Ambulatory Visit: Payer: Medicaid Other | Admitting: Family Medicine

## 2019-03-28 ENCOUNTER — Ambulatory Visit: Payer: Self-pay | Attending: Family Medicine | Admitting: Family

## 2019-03-28 ENCOUNTER — Other Ambulatory Visit: Payer: Self-pay

## 2019-03-28 ENCOUNTER — Encounter: Payer: Self-pay | Admitting: Family

## 2019-03-28 VITALS — BP 147/90 | HR 73 | Temp 98.0°F | Ht 67.0 in | Wt 224.0 lb

## 2019-03-28 DIAGNOSIS — Z955 Presence of coronary angioplasty implant and graft: Secondary | ICD-10-CM | POA: Insufficient documentation

## 2019-03-28 DIAGNOSIS — Z0000 Encounter for general adult medical examination without abnormal findings: Secondary | ICD-10-CM | POA: Insufficient documentation

## 2019-03-28 DIAGNOSIS — I5022 Chronic systolic (congestive) heart failure: Secondary | ICD-10-CM | POA: Insufficient documentation

## 2019-03-28 DIAGNOSIS — D649 Anemia, unspecified: Secondary | ICD-10-CM | POA: Insufficient documentation

## 2019-03-28 DIAGNOSIS — Z8719 Personal history of other diseases of the digestive system: Secondary | ICD-10-CM | POA: Insufficient documentation

## 2019-03-28 DIAGNOSIS — I10 Essential (primary) hypertension: Secondary | ICD-10-CM

## 2019-03-28 DIAGNOSIS — I255 Ischemic cardiomyopathy: Secondary | ICD-10-CM | POA: Insufficient documentation

## 2019-03-28 DIAGNOSIS — Z7982 Long term (current) use of aspirin: Secondary | ICD-10-CM | POA: Insufficient documentation

## 2019-03-28 DIAGNOSIS — Z79899 Other long term (current) drug therapy: Secondary | ICD-10-CM | POA: Insufficient documentation

## 2019-03-28 DIAGNOSIS — Z87891 Personal history of nicotine dependence: Secondary | ICD-10-CM | POA: Insufficient documentation

## 2019-03-28 DIAGNOSIS — I252 Old myocardial infarction: Secondary | ICD-10-CM | POA: Insufficient documentation

## 2019-03-28 DIAGNOSIS — I2510 Atherosclerotic heart disease of native coronary artery without angina pectoris: Secondary | ICD-10-CM | POA: Insufficient documentation

## 2019-03-28 DIAGNOSIS — Z7902 Long term (current) use of antithrombotics/antiplatelets: Secondary | ICD-10-CM | POA: Insufficient documentation

## 2019-03-28 DIAGNOSIS — I110 Hypertensive heart disease with heart failure: Secondary | ICD-10-CM | POA: Insufficient documentation

## 2019-03-28 DIAGNOSIS — E785 Hyperlipidemia, unspecified: Secondary | ICD-10-CM | POA: Insufficient documentation

## 2019-03-28 MED ORDER — CARVEDILOL 6.25 MG PO TABS: ORAL_TABLET | ORAL | 1 refills | Status: DC

## 2019-03-28 MED ORDER — CLOPIDOGREL BISULFATE 75 MG PO TABS: 75.0000 mg | ORAL_TABLET | Freq: Every day | ORAL | 1 refills | Status: DC

## 2019-03-28 MED ORDER — EZETIMIBE 10 MG PO TABS: 10.0000 mg | ORAL_TABLET | Freq: Every day | ORAL | 1 refills | Status: DC

## 2019-03-28 MED ORDER — ATORVASTATIN CALCIUM 80 MG PO TABS: ORAL_TABLET | ORAL | 1 refills | Status: DC

## 2019-03-28 MED ORDER — LISINOPRIL 20 MG PO TABS: 20.0000 mg | ORAL_TABLET | Freq: Every day | ORAL | 1 refills | Status: DC

## 2019-03-28 MED ORDER — SPIRONOLACTONE 25 MG PO TABS: 12.5000 mg | ORAL_TABLET | Freq: Every day | ORAL | 0 refills | Status: DC

## 2019-03-28 MED ORDER — ASPIRIN EC 81 MG PO TBEC: 81.0000 mg | DELAYED_RELEASE_TABLET | Freq: Every day | ORAL | 1 refills | Status: DC

## 2019-03-28 MED FILL — CARVEDILOL 6.25 MG TABLET: 6.25 | 30 days supply | Qty: 60 | Fill #0

## 2019-03-28 NOTE — Progress Notes (Signed)
Annual appt with no complaints  Would like medications Q3M

## 2019-03-28 NOTE — Patient Instructions (Signed)
Follow-up with attending physician in about 3 months for management of chronic diseases.  Hypertension, Adult Hypertension is another name for high blood pressure. High blood pressure forces your heart to work harder to pump blood. This can cause problems over time. There are two numbers in a blood pressure reading. There is a top number (systolic) over a bottom number (diastolic). It is best to have a blood pressure that is below 120/80. Healthy choices can help lower your blood pressure, or you may need medicine to help lower it. What are the causes? The cause of this condition is not known. Some conditions may be related to high blood pressure. What increases the risk?  Smoking.  Having type 2 diabetes mellitus, high cholesterol, or both.  Not getting enough exercise or physical activity.  Being overweight.  Having too much fat, sugar, calories, or salt (sodium) in your diet.  Drinking too much alcohol.  Having long-term (chronic) kidney disease.  Having a family history of high blood pressure.  Age. Risk increases with age.  Race. You may be at higher risk if you are African American.  Gender. Men are at higher risk than women before age 49. After age 72, women are at higher risk than men.  Having obstructive sleep apnea.  Stress. What are the signs or symptoms?  High blood pressure may not cause symptoms. Very high blood pressure (hypertensive crisis) may cause: ? Headache. ? Feelings of worry or nervousness (anxiety). ? Shortness of breath. ? Nosebleed. ? A feeling of being sick to your stomach (nausea). ? Throwing up (vomiting). ? Changes in how you see. ? Very bad chest pain. ? Seizures. How is this treated?  This condition is treated by making healthy lifestyle changes, such as: ? Eating healthy foods. ? Exercising more. ? Drinking less alcohol.  Your health care provider may prescribe medicine if lifestyle changes are not enough to get your blood pressure  under control, and if: ? Your top number is above 130. ? Your bottom number is above 80.  Your personal target blood pressure may vary. Follow these instructions at home: Eating and drinking   If told, follow the DASH eating plan. To follow this plan: ? Fill one half of your plate at each meal with fruits and vegetables. ? Fill one fourth of your plate at each meal with whole grains. Whole grains include whole-wheat pasta, brown rice, and whole-grain bread. ? Eat or drink low-fat dairy products, such as skim milk or low-fat yogurt. ? Fill one fourth of your plate at each meal with low-fat (lean) proteins. Low-fat proteins include fish, chicken without skin, eggs, beans, and tofu. ? Avoid fatty meat, cured and processed meat, or chicken with skin. ? Avoid pre-made or processed food.  Eat less than 1,500 mg of salt each day.  Do not drink alcohol if: ? Your doctor tells you not to drink. ? You are pregnant, may be pregnant, or are planning to become pregnant.  If you drink alcohol: ? Limit how much you use to:  0-1 drink a day for women.  0-2 drinks a day for men. ? Be aware of how much alcohol is in your drink. In the [place] one drink equals one 12 oz bottle of beer (355 mL), one 5 oz glass of wine (148 mL), or one 1 oz glass of hard liquor (44 mL). Lifestyle   Work with your doctor to stay at a healthy weight or to lose weight. Ask your doctor what the  best weight is for you.  Get at least 30 minutes of exercise most days of the week. This may include walking, swimming, or biking.  Get at least 30 minutes of exercise that strengthens your muscles (resistance exercise) at least 3 days a week. This may include lifting weights or doing Pilates.  Do not use any products that contain nicotine or tobacco, such as cigarettes, e-cigarettes, and chewing tobacco. If you need help quitting, ask your doctor.  Check your blood pressure at home as told by your doctor.  Keep all follow-up  visits as told by your doctor. This is important. Medicines  Take over-the-counter and prescription medicines only as told by your doctor. Follow directions carefully.  Do not skip doses of blood pressure medicine. The medicine does not work as well if you skip doses. Skipping doses also puts you at risk for problems.  Ask your doctor about side effects or reactions to medicines that you should watch for. Contact a doctor if you:  Think you are having a reaction to the medicine you are taking.  Have headaches that keep coming back (recurring).  Feel dizzy.  Have swelling in your ankles.  Have trouble with your vision. Get help right away if you:  Get a very bad headache.  Start to feel mixed up (confused).  Feel weak or numb.  Feel faint.  Have very bad pain in your: ? Chest. ? Belly (abdomen).  Throw up more than once.  Have trouble breathing. Summary  Hypertension is another name for high blood pressure.  High blood pressure forces your heart to work harder to pump blood.  For most people, a normal blood pressure is less than 120/80.  Making healthy choices can help lower blood pressure. If your blood pressure does not get lower with healthy choices, you may need to take medicine. This information is not intended to replace advice given to you by your health care provider. Make sure you discuss any questions you have with your health care provider. Document Revised: 09/09/2017 Document Reviewed: 09/09/2017 Elsevier Patient Education  2020 [employer].

## 2019-03-28 NOTE — Progress Notes (Addendum)
Patient ID: Jason Foster, male    DOB: Jan 22, 1960  MRN: [id number]  CC: Physical exam and med refills  Subjective: Jason Foster is a 59 y.o. male with history of benign essential hypertension, STEMI of anterior wall, ischemic cardiomyopathy, coronary atherosclerosis of native coronary artery, unstable angina, acute coronary syndrome, coronary artery disease, chronic systolic CHF, melena, duodenal ulcer disease, erosive gastritis, duodenitis without hemorrhage, cervical spondyloarthritis, dyslipidemia, antiplatelet or antithrombotic long-term use, absolute anemia, hyperlipidemia, and bruising. His concerns today include: physical exam (does not want colonoscopy referral) and med refills.  1. HYPERTENSION FOLLOW-UP:  Currently taking: see medication list Med Adherence: _0  Yes, may miss meds once per month Medication side effects: _1  Yes    _2  No Adherence with salt restriction: _3  Yes    _4  No Exercise: Yes _5  No _6  Home Monitoring?: _7  Yes    _8  No Monitoring Frequency: _9  Yes    _10  No Home BP results range: _11  Yes    _12  No SOB? _13  Yes    _14  No Chest Pain?: _15  Yes    _16  No Leg swelling?: _17  Yes    _18  No Headaches?: _19  Yes    _20  No Dizziness? _21  Yes    _22  No Comments: Did not take medications this morning as he is fasting for appointment. Last visit January 2020 with Dr. Chapman Fitch during that encounter patient had not taken blood pressure medication, continued on regimen of lisinopril and spironolactone, DASH diet discussed and benefits of weight loss.  2. HYPERLIPIDEMIA FOLLOW-UP:  Last Lipid Panel results:  HDL  Date Value Ref Range Status  02/18/2018 32 (L) >39 mg/dL Final   Triglycerides  Date Value Ref Range Status  02/18/2018 202 (H) 0 - 149 mg/dL Final    Med Adherence: _23  Yes    _24  No, does not take meds at least 5-8 times month  Medication side effects: _25  Yes    _26  No Muscle aches:  _27  Yes    _28  No Diet Adherence: _29  Yes, 2 to 3 times month may treat  himself to a special meal Comments: Last visit January 2020 with Dr.Fulp during that encounter low-fat diet discussed, statins regimen continued, lipid panel scheduled for future.  3. HEART FAILURE FOLLOW-UP:  Currently taking:  See med list Taking ACEI/ARB?  Yes, Lisinopril Taking beta blocker?  Yes, Carvedilol Med Adherence:  Yes Medication side effects: None Adherence with salt restriction:  Yes Shortness of breath?  No Leg swelling?  No Monitoring weight at home?  Yes Weight change?  No, has been the same for the past 2 years Last echocardiogram:  2015 Last ejection fraction:  04/04/2013 55-60% Followed by Cardiology?  yes, [place] Date last seen by Cardiology: Last Visit 03/09/2019 at [facility] with Dr. Martinique.  During the encounter patient was continued on aspirin daily, Plavix daily, carvedilol twice daily, and sublingual nitroglycerin as needed for management of CAD in native artery; initial EF 35-40% then improved to 55-60% 2 months later in relation to ischemic cardiomyopathy; continue lisinopril and spironolactone for management of essential hypertension; fasting lipid panel needed in the future and continue Atorvastatin and Zetia daily for management of mixed hyperlipidemia. Reports hasn't taken Plavix since 5 years ago, hasn't taken aspirin since 2 years ago, denies taking spironolactone, uses nitroglycerin sublingual about every 2 months as needed.    Patient Active Problem List   Diagnosis Date Noted  . 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 Chronic systolic CHF (congestive  heart failure) ([place]) 10/13/2014  . Bruising 03/17/2014  . Erosive gastritis   . Duodenal ulcer   . Duodenitis without hemorrhage   . Anemia   . Hypertension   . CAD (coronary artery disease)   . Ischemic cardiomyopathy   . HLD (hyperlipidemia)   . 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 Absolute anemia   . Melena   . Acute blood loss anemia   . Coronary artery  disease involving native coronary artery of native heart with other form of angina pectoris ([place])   . Unstable angina ([place]) 11/14/2013  . ACS (acute coronary syndrome) ([place]) 11/14/2013  . Coronary atherosclerosis of native coronary artery 02/12/2013  . ST elevation myocardial infarction (STEMI) of anterior wall ([place]) 02/10/2013  . Cardiomyopathy, ischemic 02/10/2013  . Antiplatelet or antithrombotic long-term use 02/10/2013  . Dyslipidemia 12/30/2012  . Essential hypertension, benign 11/29/2012     Current Outpatient Medications on File Prior to Visit  Medication Sig Dispense Refill  . aspirin EC 81 MG tablet Take 1 tablet (81 mg total) by mouth daily. 90 tablet 3  . atorvastatin (LIPITOR) 80 MG tablet TAKE 1 TABLET BY MOUTH DAILY AT 6PM TO LOWER CHOLESTEROL 30 tablet 0  . carvedilol (COREG) 6.25 MG tablet TAKE 1 TABLET BY MOUTH 2 TIMES DAILY WITH A MEAL. 60 tablet 0  . clopidogrel (PLAVIX) 75 MG tablet TAKE 1 TABLET (75 MG TOTAL) BY MOUTH DAILY. MUST HAVE OFFICE VISIT FOR REFILLS 30 tablet 0  . ezetimibe (ZETIA) 10 MG tablet Take 1 tablet (10 mg total) by mouth daily. 90 tablet 3  . lisinopril (ZESTRIL) 20 MG tablet TAKE 1 TABLET (20 MG TOTAL) BY MOUTH DAILY. MUST HAVE OFFICE VISIT FOR REFILLS 30 tablet 0  . methocarbamol (ROBAXIN) 500 MG tablet Take 1 tablet (500 mg total) by mouth 2 (two) times daily. (Patient not taking: Reported on 09/24/2017) 20 tablet 0  . nitroGLYCERIN (NITROSTAT) 0.4 MG SL tablet Place 1 tablet (0.4 mg total) under the tongue every 5 (five) minutes as needed for chest pain. 25 tablet 5  . spironolactone (ALDACTONE) 25 MG tablet Take 0.5 tablets (12.5 mg total) by mouth daily. Must have office visit for refills 45 tablet 0   No current facility-administered medications on file prior to visit.    No Known Allergies  Social History   Socioeconomic History  . Marital status: Married    Spouse name: Not on file  . Number of children: 3  . Years of education: Not  on file  . Highest education level: Not on file  Occupational History  . Occupation: Biomedical engineer: [employer]  Tobacco Use  . Smoking status: Former Smoker    Packs/day: 0.50    Years: 34.00    Pack years: 17.00    Types: Cigarettes    Quit date: 04/13/2012    Years since quitting: 6.9  . Smokeless tobacco: Never Used  Substance and Sexual Activity  . Alcohol use: No  . Drug use: No  . Sexual activity: Never  Other Topics Concern  . Not on file  Social History Narrative  . Not on file   Social Determinants of Health   Financial Resource Strain:   . Difficulty of Paying Living Expenses:   Food Insecurity:   . Worried About Charity fundraiser in the Last Year:   . Arboriculturist in the Last Year:   Transportation Needs:   . Film/video editor (Medical):   Marland Kitchen  Lack of Transportation (Non-Medical):   Physical Activity:   . Days of Exercise per Week:   . Minutes of Exercise per Session:   Stress:   . Feeling of Stress :   Social Connections:   . Frequency of Communication with Friends and Family:   . Frequency of Social Gatherings with Friends and Family:   . Attends Religious Services:   . Active Member of Clubs or Organizations:   . Attends Archivist Meetings:   Marland Kitchen Marital Status:   Intimate Partner Violence:   . Fear of Current or Ex-Partner:   . Emotionally Abused:   Marland Kitchen Physically Abused:   . Sexually Abused:     Family History  Family history unknown: Yes    Past Surgical History:  Procedure Laterality Date  . CORONARY ANGIOGRAM  02/10/2013   Procedure: CORONARY ANGIOGRAM;  Surgeon: Peter M Martinique, MD;  Location: [facility] CATH LAB;  Service: Cardiovascular;;  . CORONARY ANGIOPLASTY WITH STENT PLACEMENT  01/2013  . ESOPHAGOGASTRODUODENOSCOPY N/A 11/16/2013   Procedure: ESOPHAGOGASTRODUODENOSCOPY (EGD);  Surgeon: Jerene Bears, MD;  Location[facility] ENDOSCOPY;  Service: Endoscopy;  Laterality: N/A;  . PERCUTANEOUS CORONARY STENT  INTERVENTION (PCI-S)  02/10/2013   Procedure: PERCUTANEOUS CORONARY STENT INTERVENTION (PCI-S);  Surgeon: Peter M Martinique, MD;  Location: [facility] CATH LAB;  Service: Cardiovascular;;  DES x2 Prox LAD to MID LAD  . PILONIDAL CYST EXCISION  ~ 1995    ROS: Review of Systems Negative except as stated above  PHYSICAL EXAM: Vitals with BMI 03/28/2019 03/09/2019 07/28/2018  Height _0  _1  _2   Weight 224 lbs 220 lbs 224 lbs 10 oz  BMI 35.08 03.00 92.33  Systolic 007 622 633  Diastolic 90 76 84  Pulse 73 74 73      Physical Exam  General appearance - alert, well appearing, and in no distress and oriented to person, place, and time Mental status - alert, oriented to person, place, and time, normal mood, behavior, speech, dress, motor activity, and thought processes Eyes - pupils equal and reactive, extraocular eye movements intact, funduscopic exam normal, discs flat and sharp Ears - bilateral TM's and external ear canals normal Nose - normal and patent, no erythema, discharge or polyps and normal nontender sinuses Mouth - mucous membranes moist, pharynx normal without lesions and TMJ exam normal, no tenderness, normal excursion Neck - supple, no significant adenopathy, thyroid exam: thyroid is normal in size without nodules or tenderness Lymphatics - no palpable lymphadenopathy, no hepatosplenomegaly Chest - clear to auscultation, no wheezes, rales or rhonchi, symmetric air entry, no tachypnea, retractions or cyanosis Heart - normal rate, regular rhythm, normal S1, S2, no murmurs, rubs, clicks or gallops Abdomen - soft, nontender, nondistended, no masses or organomegaly no rebound tenderness noted Back exam - full range of motion, no tenderness, palpable spasm or pain on motion Neurological - alert, oriented, normal speech, no focal findings or movement disorder noted, screening mental status exam normal, neck supple without rigidity, cranial nerves II through XII intact, funduscopic exam  normal, discs flat and sharp, DTR's normal and symmetric, motor and sensory grossly normal bilaterally, normal muscle tone, no tremors, strength 5/5, Romberg sign negative, normal gait and station Musculoskeletal - no joint tenderness, deformity or swelling, no muscular tenderness noted, full range of motion without pain Extremities - peripheral pulses normal, no pedal edema, no clubbing or cyanosis, feet normal, good pulses, normal color, temperature and sensation, monofilament sensory exam is normal in both feet, no edema, redness  or tenderness in the calves or thighs Skin - normal coloration and turgor, no rashes, no suspicious skin lesions noted  CMP Latest Ref Rng & Units 02/18/2018 09/24/2017 12/03/2016  Glucose 65 - 99 mg/dL 112(H) 136(H) 83  BUN 6 - 24 mg/dL _0 Creatinine 0.76 - 1.27 mg/dL 0.92 1.04 0.94  Sodium 134 - 144 mmol/L 137 139 138  Potassium 3.5 - 5.2 mmol/L 4.8 4.8 4.6  Chloride 96 - 106 mmol/L 99 102 100  CO2 20 - 29 mmol/L _1 Calcium 8.7 - 10.2 mg/dL 9.3 9.3 9.2  Total Protein 6.0 - 8.5 g/dL 7.7 7.6 7.9  Total Bilirubin 0.0 - 1.2 mg/dL 0.4 0.2 0.3  Alkaline Phos 39 - 117 IU/L 97 114 94  AST 0 - 40 IU/L _2 ALT 0 - 44 IU/L _3 Lipid Panel     Component Value Date/Time   CHOL 185 02/18/2018 1204   TRIG 202 (H) 02/18/2018 1204   HDL 32 (L) 02/18/2018 1204   CHOLHDL 5.8 (H) 02/18/2018 1204   CHOLHDL 5.8 (H) 01/03/2016 1001   VLDL 35 (H) 01/03/2016 1001   LDLCALC 113 (H) 02/18/2018 1204    CBC    Component Value Date/Time   WBC 8.8 02/18/2018 1204   WBC 8.7 07/13/2014 1100   RBC 5.33 02/18/2018 1204   RBC 5.25 07/13/2014 1100   HGB 13.7 02/18/2018 1204   HCT 43.4 02/18/2018 1204   PLT 224 02/18/2018 1204   MCV 81 02/18/2018 1204   MCH 25.7 (L) 02/18/2018 1204   MCH 26.9 07/13/2014 1100   MCHC 31.6 02/18/2018 1204   MCHC 33.3 07/13/2014 1100   RDW 13.8 02/18/2018 1204   LYMPHSABS 3.5 (H) 02/18/2018 1204   MONOABS 0.7 07/13/2014  1100   EOSABS 0.6 (H) 02/18/2018 1204   BASOSABS 0.1 02/18/2018 1204    ASSESSMENT AND PLAN: 1. Annual physical exam: - CMP14+EGFR - Lipid Panel - CBC With Differential - Hemoglobin A1c - Fecal occult blood, imunochemical([facility])  2. Essential hypertension:  -Blood pressure not at goal as patient has not taken medications today. -Continue lisinopril and spironolactone -Medications refilled for 2 months as patient picked up a month supply of refills today prior to this visit. -Follow-up with attending physician in 3 months. -Counseled on blood pressure goal of less than 130/80, low-sodium, DASH diet, medication compliance, 150 minutes of moderate intensity exercise per week. Discussed medication compliance, adverse effects. . -Follow a Healthy Eating Plan - You can do it! . Limit sugary drinks.  Avoid sodas, sweet tea, sport or energy drinks, or fruit drinks.  Drink water, lo-fat milk, or diet drinks. . Limit snack foods.   Cut back on candy, cake, cookies, chips, ice cream.  These are a special treat, only in small amounts. . Eat plenty of vegetables.  Especially dark green, red, and orange vegetables. Aim for at least 3 servings a day. More is better! Include fruit in your daily diet.  Whole fruit is much healthier than fruit juice! . Limit "white" bread, "white" pasta, "white" rice.   Choose "100% whole grain" products, brown or wild rice. Marland Kitchen Avoid fatty meats. Try "Meatless Monday" and choose eggs or beans one day a week.  When eating meat, choose lean meats like chicken, [place], and fish.  Grill, broil, or bake meats instead of frying, and eat poultry without the skin. . Eat less salt.  Avoid frozen pizzas, frozen dinners and salty foods.  Use seasonings other than salt in cooking.  This can help blood pressure and keep you from swelling . Beer, wine and liquor have calories.  If you can safely drink alcohol, limit to 1 drink per day for women, 2 drinks for men  3.  Dyslipidemia: - atorvastatin (LIPITOR) 80 MG tablet; TAKE 1 TABLET BY MOUTH DAILY AT 6PM TO LOWER CHOLESTEROL  Dispense: 30 tablet; Refill: 1 - ezetimibe (ZETIA) 10 MG tablet; Take 1 tablet (10 mg total) by mouth daily.  Dispense: 30 tablet; Refill: 1 -Counseled patient on the importance of low-fat diet and at least 150 minutes of moderate intensity exercise each week. -Medications refilled for 2 months as patient picked up a month supply of refills today prior to this visit. -Follow-up with attending physician in 3 months.  4. Chronic systolic CHF (congestive heart failure) ([place]): - lisinopril (ZESTRIL) 20 MG tablet; Take 1 tablet (20 mg total) by mouth daily. Must have office visit for refills  Dispense: 30 tablet; Refill: 1 - spironolactone (ALDACTONE) 25 MG tablet; Take 0.5 tablets (12.5 mg total) by mouth daily. Must have office visit for refills  Dispense: 30 tablet; Refill: 0 -Counseled patient on the importance of low-salt and low-fat diet lifestyle modifications. -Medications refilled for 2 months as patient picked up a month supply of refills today prior to this visit. -Follow-up with cardiologist as scheduled.  -Follow-up with attending physician in 3 months.  5. Coronary artery disease involving native coronary artery of native heart without angina pectoris: - clopidogrel (PLAVIX) 75 MG tablet; Take 1 tablet (75 mg total) by mouth daily. Must have office visit for refills  Dispense: 30 tablet; Refill: 1 - aspirin EC 81 MG tablet; Take 1 tablet (81 mg total) by mouth daily.  Dispense: 30 tablet; Refill: 1 -Medications refilled for 2 months as patient picked up a month supply of refills today prior to this visit. -Counseled patient on the importance of low-salt and low-fat diet lifestyle modifications. -Follow-up with cardiologist as scheduled.  -Follow-up with attending physician in 3 months.  6. Ischemic cardiomyopathy:  -Continue Carvedilol, Lisinopril, and  Spironolactone -Medications refilled for 2 months as patient picked up a month supply of refills today prior to this visit. -Counseled patient on the importance of low-salt and low-fat diet lifestyle modifications. -Follow-up with cardiologist as scheduled. -Follow-up with attending physician in 3 months.  Patient was given the opportunity to ask questions.  Patient verbalized understanding of the plan and was able to repeat key elements of the plan. Patient was given clear instructions to go to Emergency Department or return to medical center if symptoms don't improve, worsen, or new problems develop.The patient verbalized understanding.   Requested Prescriptions    No prescriptions requested or ordered in this encounter    Garren Greenman Zachery Dauer, NP

## 2019-03-29 LAB — HEMOGLOBIN A1C
Est. average glucose Bld gHb Est-mCnc: 140 mg/dL
Hgb A1c MFr Bld: 6.5 % — ABNORMAL HIGH (ref 4.8–5.6)

## 2019-03-29 LAB — CBC WITH DIFFERENTIAL
Basophils Absolute: 0.1 10*3/uL (ref 0.0–0.2)
Basos: 1 %
EOS (ABSOLUTE): 0.7 10*3/uL — ABNORMAL HIGH (ref 0.0–0.4)
Eos: 7 %
Hematocrit: 44.2 % (ref 37.5–51.0)
Hemoglobin: 14.6 g/dL (ref 13.0–17.7)
Immature Grans (Abs): 0 10*3/uL (ref 0.0–0.1)
Immature Granulocytes: 0 %
Lymphocytes Absolute: 3.6 10*3/uL — ABNORMAL HIGH (ref 0.7–3.1)
Lymphs: 36 %
MCH: 26.3 pg — ABNORMAL LOW (ref 26.6–33.0)
MCHC: 33 g/dL (ref 31.5–35.7)
MCV: 80 fL (ref 79–97)
Monocytes Absolute: 0.7 10*3/uL (ref 0.1–0.9)
Monocytes: 7 %
Neutrophils Absolute: 5 10*3/uL (ref 1.4–7.0)
Neutrophils: 49 %
RBC: 5.56 x10E6/uL (ref 4.14–5.80)
RDW: 14.1 % (ref 11.6–15.4)
WBC: 10 10*3/uL (ref 3.4–10.8)

## 2019-03-29 LAB — LIPID PANEL
Chol/HDL Ratio: 4.9 ratio (ref 0.0–5.0)
Cholesterol, Total: 157 mg/dL (ref 100–199)
HDL: 32 mg/dL — ABNORMAL LOW (ref >39)
LDL Chol Calc (NIH): 98 mg/dL (ref 0–99)
Triglycerides: 155 mg/dL — ABNORMAL HIGH (ref 0–149)
VLDL Cholesterol Cal: 27 mg/dL (ref 5–40)

## 2019-03-29 LAB — CMP14+EGFR
ALT: 20 IU/L (ref 0–44)
AST: 22 IU/L (ref 0–40)
Albumin/Globulin Ratio: 1.2 (ref 1.2–2.2)
Albumin: 4.1 g/dL (ref 3.8–4.9)
Alkaline Phosphatase: 101 IU/L (ref 39–117)
BUN/Creatinine Ratio: 11 (ref 10–24)
BUN: 11 mg/dL (ref 8–27)
Bilirubin Total: 0.4 mg/dL (ref 0.0–1.2)
CO2: 23 mmol/L (ref 20–29)
Calcium: 9.2 mg/dL (ref 8.6–10.2)
Chloride: 102 mmol/L (ref 96–106)
Creatinine, Ser: 1.04 mg/dL (ref 0.76–1.27)
GFR calc Af Amer: 90 mL/min/{1.73_m2} (ref >59)
GFR calc non Af Amer: 78 mL/min/{1.73_m2} (ref >59)
Globulin, Total: 3.3 g/dL (ref 1.5–4.5)
Glucose: 113 mg/dL — ABNORMAL HIGH (ref 65–99)
Potassium: 4.8 mmol/L (ref 3.5–5.2)
Sodium: 137 mmol/L (ref 134–144)
Total Protein: 7.4 g/dL (ref 6.0–8.5)

## 2019-03-29 NOTE — Progress Notes (Signed)
Please call patient with update.   CMP normal excluding glucose. Fasting glucose 113 indicates impaired fasting glucose which is associated with pre-diabetes. Fasting glucose should be less than 100.  HgA1C 6.5% which indicates diagnosis for diabetes.   A low-carb and low-sugar diet, moderate intensity exercise at least 150 mins/week as tolerated and healthy lifestyle modifications may help improve both fasting glucose and A1C. Will not add antidiabetic medication at this time. Will reassess in 3 months at scheduled follow-up visit.  Lipid panel - triglycerides elevated and HDL better known as "good cholesterol" decreased. Please continue Atorvastatin and Ezetimibe as prescribed, practice low-fat diet, and exercise daily for at least 30 mins.   CBC - MCH slightly decreased but improved since last years visit. Lymphocytes and EOS elevated but about the same value as last years visit.

## 2019-03-30 LAB — FECAL OCCULT BLOOD, IMMUNOCHEMICAL: Fecal Occult Bld: NEGATIVE

## 2019-03-31 NOTE — Progress Notes (Signed)
Fecal occult blood negative. 

## 2019-04-12 MED FILL — SPIRONOLACTONE 25 MG TABLET: 25 | 30 days supply | Qty: 15 | Fill #2

## 2019-04-12 MED FILL — EZETIMIBE 10 MG TAB: 10 | 30 days supply | Qty: 30 | Fill #0

## 2019-06-17 MED FILL — LISINOPRIL 20 MG TABLET: 20 | 60 days supply | Qty: 60 | Fill #0

## 2019-06-17 MED FILL — CLOPIDOGREL 75 MG TABLET: 75 | 60 days supply | Qty: 60 | Fill #0

## 2019-06-17 MED FILL — EZETIMIBE 10 MG TAB: 10 | 30 days supply | Qty: 30 | Fill #1

## 2019-06-17 MED FILL — SPIRONOLACTONE 25 MG TABLET: 25 | 30 days supply | Qty: 30 | Fill #0

## 2019-06-17 MED FILL — ATORVASTATIN 80 MG TABLET: 80 | 60 days supply | Qty: 60 | Fill #0

## 2019-06-17 MED FILL — CARVEDILOL 6.25 MG TABLET: 6.25 | 60 days supply | Qty: 120 | Fill #0

## 2019-06-28 ENCOUNTER — Encounter: Payer: Self-pay | Admitting: Internal Medicine

## 2019-06-28 ENCOUNTER — Ambulatory Visit: Payer: Self-pay | Attending: Internal Medicine | Admitting: Internal Medicine

## 2019-06-28 DIAGNOSIS — I5022 Chronic systolic (congestive) heart failure: Secondary | ICD-10-CM

## 2019-06-28 DIAGNOSIS — E785 Hyperlipidemia, unspecified: Secondary | ICD-10-CM

## 2019-06-28 DIAGNOSIS — Z2821 Immunization not carried out because of patient refusal: Secondary | ICD-10-CM

## 2019-06-28 DIAGNOSIS — I10 Essential (primary) hypertension: Secondary | ICD-10-CM

## 2019-06-28 DIAGNOSIS — I25118 Atherosclerotic heart disease of native coronary artery with other forms of angina pectoris: Secondary | ICD-10-CM

## 2019-06-28 DIAGNOSIS — E1169 Type 2 diabetes mellitus with other specified complication: Secondary | ICD-10-CM

## 2019-06-28 DIAGNOSIS — E119 Type 2 diabetes mellitus without complications: Secondary | ICD-10-CM

## 2019-06-28 MED ORDER — LISINOPRIL 20 MG PO TABS: 20.0000 mg | ORAL_TABLET | Freq: Every day | ORAL | 1 refills | Status: DC

## 2019-06-28 MED ORDER — SPIRONOLACTONE 25 MG PO TABS: 12.5000 mg | ORAL_TABLET | Freq: Every day | ORAL | 1 refills | Status: DC

## 2019-06-28 MED ORDER — TRUEPLUS LANCETS 28G MISC: 4 refills | Status: AC

## 2019-06-28 MED ORDER — TRUE METRIX BLOOD GLUCOSE TEST VI STRP: ORAL_STRIP | 12 refills | Status: AC

## 2019-06-28 MED ORDER — ATORVASTATIN CALCIUM 80 MG PO TABS: ORAL_TABLET | ORAL | 1 refills | Status: DC

## 2019-06-28 MED ORDER — CLOPIDOGREL BISULFATE 75 MG PO TABS: 75.0000 mg | ORAL_TABLET | Freq: Every day | ORAL | 4 refills | Status: DC

## 2019-06-28 MED ORDER — TRUE METRIX METER W/DEVICE KIT: PACK | 0 refills | Status: AC

## 2019-06-28 MED ORDER — EZETIMIBE 10 MG PO TABS: 10.0000 mg | ORAL_TABLET | Freq: Every day | ORAL | 1 refills | Status: DC

## 2019-06-28 MED FILL — TRUEplus LANCETS 28G MISC: 25 days supply | Qty: 100 | Fill #0

## 2019-06-28 MED FILL — !TRUE METRIX BLOOD GLUCOSE: 365 days supply | Qty: 1 | Fill #0

## 2019-06-28 MED FILL — TRUE METRIX TEST STRIP: 25 days supply | Qty: 100 | Fill #0

## 2019-06-28 NOTE — Progress Notes (Signed)
No concerns at this time RF's last week

## 2019-06-28 NOTE — Patient Instructions (Signed)
Diabetes Mellitus and Standards of Medical Care Managing diabetes (diabetes mellitus) can be complicated. Your diabetes treatment may be managed by a team of health care providers, including:  A physician who specializes in diabetes (endocrinologist).  A nurse practitioner or physician assistant.  Nurses.  A diet and nutrition specialist (registered dietitian).  A certified diabetes educator (CDE).  An exercise specialist.  A pharmacist.  An eye doctor.  A foot specialist (podiatrist).  A dentist.  A primary care provider.  A mental health provider. Your health care providers follow guidelines to help you get the best quality of care. The following schedule is a general guideline for your diabetes management plan. Your health care providers may give you more specific instructions. Physical exams Upon being diagnosed with diabetes mellitus, and each year after that, your health care provider will ask about your medical and family history. He or she will also do a physical exam. Your exam may include:  Measuring your height, weight, and body mass index (BMI).  Checking your blood pressure. This will be done at every routine medical visit. Your target blood pressure may vary depending on your medical conditions, your age, and other factors.  Thyroid gland exam.  Skin exam.  Screening for damage to your nerves (peripheral neuropathy). This may include checking the pulse in your legs and feet and checking the level of sensation in your hands and feet.  A complete foot exam to inspect the structure and skin of your feet, including checking for cuts, bruises, redness, blisters, sores, or other problems.  Screening for blood vessel (vascular) problems, which may include checking the pulse in your legs and feet and checking your temperature. Blood tests Depending on your treatment plan and your personal needs, you may have the following tests done:  HbA1c (hemoglobin A1c). This  test provides information about blood sugar (glucose) control over the previous 2-3 months. It is used to adjust your treatment plan, if needed. This test will be done: ? At least 2 times a year, if you are meeting your treatment goals. ? 4 times a year, if you are not meeting your treatment goals or if treatment goals have changed.  Lipid testing, including total, LDL, and HDL cholesterol and triglyceride levels. ? The goal for LDL is less than 100 mg/dL (5.5 mmol/L). If you are at high risk for complications, the goal is less than 70 mg/dL (3.9 mmol/L). ? The goal for HDL is 40 mg/dL (2.2 mmol/L) or higher for men and 50 mg/dL (2.8 mmol/L) or higher for women. An HDL cholesterol of 60 mg/dL (3.3 mmol/L) or higher gives some protection against heart disease. ? The goal for triglycerides is less than 150 mg/dL (8.3 mmol/L).  Liver function tests.  Kidney function tests.  Thyroid function tests. Dental and eye exams  Visit your dentist two times a year.  If you have type 1 diabetes, your health care provider may recommend an eye exam 3-5 years after you are diagnosed, and then once a year after your first exam. ? For children with type 1 diabetes, a health care provider may recommend an eye exam when your child is age 10 or older and has had diabetes for 3-5 years. After the first exam, your child should get an eye exam once a year.  If you have type 2 diabetes, your health care provider may recommend an eye exam as soon as you are diagnosed, and then once a year after your first exam. Immunizations   The   yearly flu (influenza) vaccine is recommended for everyone 6 months or older who has diabetes.  The pneumonia (pneumococcal) vaccine is recommended for everyone 2 years or older who has diabetes. If you are 65 or older, you may get the pneumonia vaccine as a series of two separate shots.  The hepatitis B vaccine is recommended for adults shortly after being diagnosed with  diabetes.  Adults and children with diabetes should receive all other vaccines according to age-specific recommendations from the Centers for Disease Control and Prevention (CDC). Mental and emotional health Screening for symptoms of eating disorders, anxiety, and depression is recommended at the time of diagnosis and afterward as needed. If your screening shows that you have symptoms (positive screening result), you may need more evaluation and you may work with a mental health care provider. Treatment plan Your treatment plan will be reviewed at every medical visit. You and your health care provider will discuss:  How you are taking your medicines, including insulin.  Any side effects you are experiencing.  Your blood glucose target goals.  The frequency of your blood glucose monitoring.  Lifestyle habits, such as activity level as well as tobacco, alcohol, and substance use. Diabetes self-management education Your health care provider will assess how well you are monitoring your blood glucose levels and whether you are taking your insulin correctly. He or she may refer you to:  A certified diabetes educator to manage your diabetes throughout your life, starting at diagnosis.  A registered dietitian who can create or review your personal nutrition plan.  An exercise specialist who can discuss your activity level and exercise plan. Summary  Managing diabetes (diabetes mellitus) can be complicated. Your diabetes treatment may be managed by a team of health care providers.  Your health care providers follow guidelines in order to help you get the best quality of care.  Standards of care including having regular physical exams, blood tests, blood pressure monitoring, immunizations, screening tests, and education about how to manage your diabetes.  Your health care providers may also give you more specific instructions based on your individual health. This information is not intended  to replace advice given to you by your health care provider. Make sure you discuss any questions you have with your health care provider. Document Revised: 09/18/2017 Document Reviewed: 09/28/2015 Elsevier Patient Education  2020 Elsevier Inc.   Diabetes Mellitus and Nutrition, Adult When you have diabetes (diabetes mellitus), it is very important to have healthy eating habits because your blood sugar (glucose) levels are greatly affected by what you eat and drink. Eating healthy foods in the appropriate amounts, at about the same times every day, can help you:  Control your blood glucose.  Lower your risk of heart disease.  Improve your blood pressure.  Reach or maintain a healthy weight. Every person with diabetes is different, and each person has different needs for a meal plan. Your health care provider may recommend that you work with a diet and nutrition specialist (dietitian) to make a meal plan that is best for you. Your meal plan may vary depending on factors such as:  The calories you need.  The medicines you take.  Your weight.  Your blood glucose, blood pressure, and cholesterol levels.  Your activity level.  Other health conditions you have, such as heart or kidney disease. How do carbohydrates affect me? Carbohydrates, also called carbs, affect your blood glucose level more than any other type of food. Eating carbs naturally raises the amount of   glucose in your blood. Carb counting is a method for keeping track of how many carbs you eat. Counting carbs is important to keep your blood glucose at a healthy level, especially if you use insulin or take certain oral diabetes medicines. It is important to know how many carbs you can safely have in each meal. This is different for every person. Your dietitian can help you calculate how many carbs you should have at each meal and for each snack. Foods that contain carbs include:  Bread, cereal, rice, pasta, and  crackers.  Potatoes and corn.  Peas, beans, and lentils.  Milk and yogurt.  Fruit and juice.  Desserts, such as cakes, cookies, ice cream, and candy. How does alcohol affect me? Alcohol can cause a sudden decrease in blood glucose (hypoglycemia), especially if you use insulin or take certain oral diabetes medicines. Hypoglycemia can be a life-threatening condition. Symptoms of hypoglycemia (sleepiness, dizziness, and confusion) are similar to symptoms of having too much alcohol. If your health care provider says that alcohol is safe for you, follow these guidelines:  Limit alcohol intake to no more than 1 drink per day for nonpregnant women and 2 drinks per day for men. One drink equals 12 oz of beer, 5 oz of wine, or 1 oz of hard liquor.  Do not drink on an empty stomach.  Keep yourself hydrated with water, diet soda, or unsweetened iced tea.  Keep in mind that regular soda, juice, and other mixers may contain a lot of sugar and must be counted as carbs. What are tips for following this plan?  Reading food labels  Start by checking the serving size on the "Nutrition Facts" label of packaged foods and drinks. The amount of calories, carbs, fats, and other nutrients listed on the label is based on one serving of the item. Many items contain more than one serving per package.  Check the total grams (g) of carbs in one serving. You can calculate the number of servings of carbs in one serving by dividing the total carbs by 15. For example, if a food has 30 g of total carbs, it would be equal to 2 servings of carbs.  Check the number of grams (g) of saturated and trans fats in one serving. Choose foods that have low or no amount of these fats.  Check the number of milligrams (mg) of salt (sodium) in one serving. Most people should limit total sodium intake to less than 2,300 mg per day.  Always check the nutrition information of foods labeled as "low-fat" or "nonfat". These foods may be  higher in added sugar or refined carbs and should be avoided.  Talk to your dietitian to identify your daily goals for nutrients listed on the label. Shopping  Avoid buying canned, premade, or processed foods. These foods tend to be high in fat, sodium, and added sugar.  Shop around the outside edge of the grocery store. This includes fresh fruits and vegetables, bulk grains, fresh meats, and fresh dairy. Cooking  Use low-heat cooking methods, such as baking, instead of high-heat cooking methods like deep frying.  Cook using healthy oils, such as olive, canola, or sunflower oil.  Avoid cooking with butter, cream, or high-fat meats. Meal planning  Eat meals and snacks regularly, preferably at the same times every day. Avoid going long periods of time without eating.  Eat foods high in fiber, such as fresh fruits, vegetables, beans, and whole grains. Talk to your dietitian about how many   servings of carbs you can eat at each meal.  Eat 4-6 ounces (oz) of lean protein each day, such as lean meat, chicken, fish, eggs, or tofu. One oz of lean protein is equal to: ? 1 oz of meat, chicken, or fish. ? 1 egg. ?  cup of tofu.  Eat some foods each day that contain healthy fats, such as avocado, nuts, seeds, and fish. Lifestyle  Check your blood glucose regularly.  Exercise regularly as told by your health care provider. This may include: ? 150 minutes of moderate-intensity or vigorous-intensity exercise each week. This could be brisk walking, biking, or water aerobics. ? Stretching and doing strength exercises, such as yoga or weightlifting, at least 2 times a week.  Take medicines as told by your health care provider.  Do not use any products that contain nicotine or tobacco, such as cigarettes and e-cigarettes. If you need help quitting, ask your health care provider.  Work with a counselor or diabetes educator to identify strategies to manage stress and any emotional and social  challenges. Questions to ask a health care provider  Do I need to meet with a diabetes educator?  Do I need to meet with a dietitian?  What number can I call if I have questions?  When are the best times to check my blood glucose? Where to find more information:  American Diabetes Association: diabetes.org  Academy of Nutrition and Dietetics: www.eatright.org  National Institute of Diabetes and Digestive and Kidney Diseases (NIH): www.niddk.nih.gov Summary  A healthy meal plan will help you control your blood glucose and maintain a healthy lifestyle.  Working with a diet and nutrition specialist (dietitian) can help you make a meal plan that is best for you.  Keep in mind that carbohydrates (carbs) and alcohol have immediate effects on your blood glucose levels. It is important to count carbs and to use alcohol carefully. This information is not intended to replace advice given to you by your health care provider. Make sure you discuss any questions you have with your health care provider. Document Revised: 12/12/2016 Document Reviewed: 02/04/2016 Elsevier Patient Education  2020 Elsevier Inc.  

## 2019-06-28 NOTE — Addendum Note (Signed)
Addended by: Jonah Blue B on: 06/28/2019 05:49 PM   Modules accepted: Level of Service

## 2019-06-28 NOTE — Progress Notes (Signed)
Virtual Visit via Telephone Note Due to current restrictions/limitations of in-office visits due to the COVID-19 pandemic, this scheduled clinical appointment was converted to a telehealth visit  I connected with Jason Foster on 06/28/19 at 11:18 a.m by telephone and verified that I am speaking with the correct person using two identifiers. I am in my office.  The patient sounded as though he was at work.  Only the patient and myself participated in this encounter.  I discussed the limitations, risks, security and privacy concerns of performing an evaluation and management service by telephone and the availability of in person appointments. I also discussed with the patient that there may be a patient responsible charge related to this service. The patient expressed understanding and agreed to proceed.   History of Present Illness: Patient with history of ICM with DES x2 (followed by cardiologist Dr. Neita Garnet), HL, HTN, chronic systolic CHF with EF 78-29%, PUD.  Patient wanting to establish care with me.  Previous PCP was Dr. Doreene Burke he will is no longer with [place].  He had seen my nurse practitioner 03/2019.  Screened for DM positive on last visit with nurse practitioner in March.  His A1c was 6.5.  He reports having family history of diabetes in his mother.  No polyuria or polydipsia at this time.  HYPERTENSION/CAD/CHF Currently taking: see medication list.  Reports compliance with taking Lipitor, carvedilol, Zetia, spironolactone, lisinopril, and Plavix. Med Adherence: '[x]'  Yes    '[]'  No Medication side effects: '[]'  Yes    '[x]'  No Adherence with salt restriction: '[]'  Yes    '[x]'  No Home Monitoring?: '[]'  Yes    '[x]'  No.  No device to check blood pressure.  However he checks it occasionally outside the home Monitoring Frequency: '[]'  Yes    '[]'  No Home BP results range: '[]'  Yes    '[]'  No SOB? '[]'  Yes    '[x]'  No Chest Pain?: '[x]'  Yes - occasionally.  Last used SL Nitro 1 mth ago    '[]'  No Leg swelling?: '[]'  Yes     '[x]'  No Headaches?: '[]'  Yes    '[x]'  No Dizziness? '[]'  Yes    '[x]'  No Comments:   HM: he does not want COVID vaccine  Current Outpatient Medications  Medication Instructions  . aspirin EC 81 mg, Oral, Daily  . atorvastatin (LIPITOR) 80 MG tablet TAKE 1 TABLET BY MOUTH DAILY AT 6PM TO LOWER CHOLESTEROL  . carvedilol (COREG) 6.25 MG tablet TAKE 1 TABLET BY MOUTH 2 TIMES DAILY WITH A MEAL.  Marland Kitchen clopidogrel (PLAVIX) 75 mg, Oral, Daily, Must have office visit for refills  . ezetimibe (ZETIA) 10 mg, Oral, Daily  . lisinopril (ZESTRIL) 20 mg, Oral, Daily, Must have office visit for refills  . methocarbamol (ROBAXIN) 500 mg, Oral, 2 times daily  . nitroGLYCERIN (NITROSTAT) 0.4 mg, Sublingual, Every 5 min PRN  . spironolactone (ALDACTONE) 12.5 mg, Oral, Daily, Must have office visit for refills    Observations/Objective: Lab Results  Component Value Date   WBC 10.0 03/28/2019   HGB 14.6 03/28/2019   HCT 44.2 03/28/2019   MCV 80 03/28/2019   PLT 224 02/18/2018     Chemistry      Component Value Date/Time   NA 137 03/28/2019 0941   K 4.8 03/28/2019 0941   CL 102 03/28/2019 0941   CO2 23 03/28/2019 0941   BUN 11 03/28/2019 0941   CREATININE 1.04 03/28/2019 0941   CREATININE 0.91 08/30/2015 1109      Component  Value Date/Time   CALCIUM 9.2 03/28/2019 0941   ALKPHOS 101 03/28/2019 0941   AST 22 03/28/2019 0941   ALT 20 03/28/2019 0941   BILITOT 0.4 03/28/2019 0941     Lab Results  Component Value Date   CHOL 157 03/28/2019   HDL 32 (L) 03/28/2019   LDLCALC 98 03/28/2019   TRIG 155 (H) 03/28/2019   CHOLHDL 4.9 03/28/2019   Lab Results  Component Value Date   HGBA1C 6.5 (H) 03/28/2019     Assessment and Plan: 1. New onset type 2 diabetes mellitus ([place]) Discussed the importance of healthy eating habits, regular aerobic exercise (at least 150 minutes a week as tolerated and based on heart status) and medication compliance to achieve or maintain control of diabetes.  Will not  start him on medication at this time based on his last A1c.  Dietary counseling given including the need to eliminate sugary drinks from the diet, incorporate fresh fruits and vegetables into the diet, cut back on white carbohydrates and try to eat more lean meat.  He is agreeable to referral to see a nutritionist. -He is agreeable to being prescribed a glucometer.  I went over blood sugar goals before meals as 90-130.  He can check his blood sugars about twice a week.  - Amb ref to Medical Nutrition Therapy-MNT - glucose blood (TRUE METRIX BLOOD GLUCOSE TEST) test strip; Use as instructed  Dispense: 100 each; Refill: 12 - Blood Glucose Monitoring Suppl (TRUE METRIX METER) w/Device KIT; Use as directed  Dispense: 1 kit; Refill: 0 - TRUEplus Lancets 28G MISC; Use as directed  Dispense: 100 each; Refill: 4 - Hemoglobin A1c; Future  2. Essential hypertension Continue current medications and low-salt diet - spironolactone (ALDACTONE) 25 MG tablet; Take 0.5 tablets (12.5 mg total) by mouth daily. Must have office visit for refills  Dispense: 90 tablet; Refill: 1 - lisinopril (ZESTRIL) 20 MG tablet; Take 1 tablet (20 mg total) by mouth daily. Must have office visit for refills  Dispense: 90 tablet; Refill: 1  3. Coronary artery disease of native artery of native heart with stable angina pectoris ([place]) Clinically stable.  Continue current medications as directed by cardiology - clopidogrel (PLAVIX) 75 MG tablet; Take 1 tablet (75 mg total) by mouth daily. Must have office visit for refills  Dispense: 30 tablet; Refill: 4 - ezetimibe (ZETIA) 10 MG tablet; Take 1 tablet (10 mg total) by mouth daily.  Dispense: 90 tablet; Refill: 1 - atorvastatin (LIPITOR) 80 MG tablet; TAKE 1 TABLET BY MOUTH DAILY AT 6PM TO LOWER CHOLESTEROL  Dispense: 90 tablet; Refill: 1  4. Chronic systolic CHF (congestive heart failure) ([facility]) Clinically stable.  Advised importance of DASH diet - spironolactone (ALDACTONE) 25 MG  tablet; Take 0.5 tablets (12.5 mg total) by mouth daily. Must have office visit for refills  Dispense: 90 tablet; Refill: 1 - lisinopril (ZESTRIL) 20 MG tablet; Take 1 tablet (20 mg total) by mouth daily. Must have office visit for refills  Dispense: 90 tablet; Refill: 1  5. Hyperlipidemia associated with type 2 diabetes mellitus ([facility]) - ezetimibe (ZETIA) 10 MG tablet; Take 1 tablet (10 mg total) by mouth daily.  Dispense: 90 tablet; Refill: 1 - atorvastatin (LIPITOR) 80 MG tablet; TAKE 1 TABLET BY MOUTH DAILY AT 6PM TO LOWER CHOLESTEROL  Dispense: 90 tablet; Refill: 1  6. COVID-19 virus vaccination declined Discussed with patient that Covid vaccines are relatively safe and effective.  However he still declines getting the vaccine.   Follow  Up Instructions: 3-4 mths   I discussed the assessment and treatment plan with the patient. The patient was provided an opportunity to ask questions and all were answered. The patient agreed with the plan and demonstrated an understanding of the instructions.   The patient was advised to call back or seek an in-person evaluation if the symptoms worsen or if the condition fails to improve as anticipated.  I provided 14 min 25 sec minutes of non-face-to-face time during this encounter.   Karle Plumber, MD

## 2019-07-28 ENCOUNTER — Ambulatory Visit: Payer: Self-pay | Attending: Internal Medicine

## 2019-07-28 ENCOUNTER — Other Ambulatory Visit: Payer: Self-pay

## 2019-07-28 DIAGNOSIS — E119 Type 2 diabetes mellitus without complications: Secondary | ICD-10-CM

## 2019-07-28 MED FILL — TRUEplus LANCETS 28G MISC: 75 days supply | Qty: 300 | Fill #0

## 2019-07-28 MED FILL — EZETIMIBE 10 MG TAB: 10 | 90 days supply | Qty: 90 | Fill #0

## 2019-07-28 MED FILL — !TRUE METRIX BLOOD GLUCOSE: 365 days supply | Qty: 1 | Fill #0

## 2019-07-28 MED FILL — TRUE METRIX TEST STRIP: 75 days supply | Qty: 300 | Fill #0

## 2019-07-29 LAB — HEMOGLOBIN A1C
Est. average glucose Bld gHb Est-mCnc: 134 mg/dL
Hgb A1c MFr Bld: 6.3 % — ABNORMAL HIGH (ref 4.8–5.6)

## 2019-07-29 NOTE — Progress Notes (Signed)
Let patient know that his A1c is 6.3 meaning that his diabetes is under good control.  Continue healthy eating habits and regular exercise.

## 2019-08-01 ENCOUNTER — Telehealth: Payer: Self-pay

## 2019-08-01 NOTE — Telephone Encounter (Signed)
Contacted pt to go over lab results pt is aware and doesn't have any questions or concerns 

## 2019-08-04 ENCOUNTER — Ambulatory Visit: Payer: Medicaid Other

## 2019-08-11 ENCOUNTER — Ambulatory Visit: Payer: Medicaid Other

## 2019-09-13 NOTE — Progress Notes (Signed)
Jason Foster Date of Birth: 1960/09/11 Medical Record #[id number]  History of Present Illness: [name] is seen for follow up CAD. He is s/p anterior STEMI on 02/10/13 treated with DES x 2 of the LAD. ( 3.0x 38 mm Promus and 3.5 x 32 Promus) He has a history of HTN and hyperlipidemia. He is a former smoker. On emergent cardiac cath he had occlusion of the proximal LAD. The RCA was also occluded in the mid vessel but this appeared chronic and was collateralized. EF was 35-40%. In November 2015 he was admitted with an UGI bleed. He was found to have a gastritis/duodenitis and duodenal ulcer. He was treated with PPI and Hgb later normalized.   On follow up today he is feeling very well. Marland Kitchen He denies any chest pain or SOB. No edema or palpitations. He is taking his medication regularly.  He is eating healthier and has lost 20 lbs.  Current Outpatient Medications on File Prior to Visit  Medication Sig Dispense Refill   aspirin EC 81 MG tablet Take 1 tablet (81 mg total) by mouth daily. 30 tablet 1   atorvastatin (LIPITOR) 80 MG tablet TAKE 1 TABLET BY MOUTH DAILY AT 6PM TO LOWER CHOLESTEROL 90 tablet 1   Blood Glucose Monitoring Suppl (TRUE METRIX METER) w/Device KIT Use as directed 1 kit 0   carvedilol (COREG) 6.25 MG tablet TAKE 1 TABLET BY MOUTH 2 TIMES DAILY WITH A MEAL. 60 tablet 1   clopidogrel (PLAVIX) 75 MG tablet Take 1 tablet (75 mg total) by mouth daily. Must have office visit for refills 30 tablet 4   ezetimibe (ZETIA) 10 MG tablet Take 1 tablet (10 mg total) by mouth daily. 90 tablet 1   glucose blood (TRUE METRIX BLOOD GLUCOSE TEST) test strip Use as instructed 100 each 12   lisinopril (ZESTRIL) 20 MG tablet Take 1 tablet (20 mg total) by mouth daily. Must have office visit for refills 90 tablet 1   methocarbamol (ROBAXIN) 500 MG tablet Take 1 tablet (500 mg total) by mouth 2 (two) times daily. 20 tablet 0   nitroGLYCERIN (NITROSTAT) 0.4 MG SL tablet Place 1 tablet (0.4 mg  total) under the tongue every 5 (five) minutes as needed for chest pain. 25 tablet 5   spironolactone (ALDACTONE) 25 MG tablet Take 0.5 tablets (12.5 mg total) by mouth daily. Must have office visit for refills 90 tablet 1   TRUEplus Lancets 28G MISC Use as directed 100 each 4   No current facility-administered medications on file prior to visit.    No Known Allergies  Past Medical History:  Diagnosis Date   Anemia    a. 11/2013 in setting of ulcer/gastritis.   CAD (coronary artery disease)    a. Ant STEMI (01/2013):  LHC - dLM 20, pLAD 100 (DES x 2), CFX < 20, mRCA 100% (L-R collats)   Chronic systolic CHF (congestive heart failure) ([place]) 10/13/2014   Duodenal ulcer    a. 11/2013 EGD: medium sized, non-bleeding ulcer @ duodenal bulb.   Duodenitis without hemorrhage    a. 11/2013 EGD: erosive duodenitis w/o bleeding.   Erosive gastritis    a. 11/2013 EGD: prepyloric region of stomach and gastric antrum.   HLD (hyperlipidemia)    Hypertension    Ischemic cardiomyopathy    a. echo (01/2013):  mild LVH, EF 35-40%, AS and apical AK, PASP 34    Past Surgical History:  Procedure Laterality Date   CORONARY ANGIOGRAM  02/10/2013  Procedure: CORONARY ANGIOGRAM;  Surgeon: Maddisen Vought M Martinique, MD;  Location: [facility] CATH LAB;  Service: Cardiovascular;;   CORONARY ANGIOPLASTY WITH STENT PLACEMENT  01/2013   ESOPHAGOGASTRODUODENOSCOPY N/A 11/16/2013   Procedure: ESOPHAGOGASTRODUODENOSCOPY (EGD);  Surgeon: Jerene Bears, MD;  Location: [facility] ENDOSCOPY;  Service: Endoscopy;  Laterality: N/A;   PERCUTANEOUS CORONARY STENT INTERVENTION (PCI-S)  02/10/2013   Procedure: PERCUTANEOUS CORONARY STENT INTERVENTION (PCI-S);  Surgeon: Corry Ihnen M Martinique, MD;  Location: [facility] CATH LAB;  Service: Cardiovascular;;  DES x2 Prox LAD to MID LAD   PILONIDAL CYST EXCISION  ~ 1995    Social History   Tobacco Use  Smoking Status Former Smoker   Packs/day: 0.50   Years: 34.00   Pack years: 17.00   Types:  Cigarettes   Quit date: 04/13/2012   Years since quitting: 7.4  Smokeless Tobacco Never Used    Social History   Substance and Sexual Activity  Alcohol Use No    Family History  Family history unknown: Yes    Review of Systems: As noted in HPI.  All other systems were reviewed and are negative.  Physical Exam: BP 128/78    Pulse 73    Ht _0  (1.702 m)    Wt 204 lb 9.6 oz (92.8 kg)    BMI 32.04 kg/m  GENERAL:  Well appearing male in NAD HEENT:  PERRL, EOMI, sclera are clear. Oropharynx is clear. NECK:  No jugular venous distention, carotid upstroke brisk and symmetric, no bruits, no thyromegaly or adenopathy LUNGS:  Clear to auscultation bilaterally CHEST:  Unremarkable HEART:  RRR,  PMI not displaced or sustained,S1 and S2 within normal limits, no S3, no S4: no clicks, no rubs, no murmurs ABD:  Soft, nontender. BS +, no masses or bruits. No hepatomegaly, no splenomegaly EXT:  2 + pulses throughout, no edema, no cyanosis no clubbing SKIN:  Warm and dry.  No rashes NEURO:  Alert and oriented x 3. Cranial nerves II through XII intact. PSYCH:  Cognitively intact   LABORATORY DATA: Lab Results  Component Value Date   WBC 10.0 03/28/2019   HGB 14.6 03/28/2019   HCT 44.2 03/28/2019   PLT 224 02/18/2018   GLUCOSE 113 (H) 03/28/2019   CHOL 157 03/28/2019   TRIG 155 (H) 03/28/2019   HDL 32 (L) 03/28/2019   LDLCALC 98 03/28/2019   ALT 20 03/28/2019   AST 22 03/28/2019   NA 137 03/28/2019   K 4.8 03/28/2019   CL 102 03/28/2019   CREATININE 1.04 03/28/2019   BUN 11 03/28/2019   CO2 23 03/28/2019   TSH 1.610 12/03/2016   INR 1.26 11/14/2013   HGBA1C 6.3 (H) 07/28/2019    Assessment/plan:   1. CAD s/p anterior MI with stenting of the LAD with DES x 2 in January 2015. Given extensive stenting in the LAD I would prefer long term DAPT.  Will treat chronic total occlusion of RCA medically.  He is asymptomatic. Continue lifestyle modification with regular aerobic exercise,  weight loss and diet.   2. Ischemic cardiomyopathy. EF 35-40% initially with MI. EF improved to normal 2 months later. No evidence of volume overload.  Continue Coreg, lisinopril, aldactone.   3. HTN controlled.  4. Hyperlipidemia on high dose statin and Zetia. LDL is not at goal of <70.  Additional therapy limited by cost. Triglycerides have improved with weight reduction.    Follow up in one year.

## 2019-09-15 ENCOUNTER — Other Ambulatory Visit: Payer: Self-pay

## 2019-09-15 ENCOUNTER — Other Ambulatory Visit: Payer: Self-pay | Admitting: Cardiology

## 2019-09-15 ENCOUNTER — Ambulatory Visit (INDEPENDENT_AMBULATORY_CARE_PROVIDER_SITE_OTHER): Payer: Self-pay | Admitting: Cardiology

## 2019-09-15 ENCOUNTER — Encounter: Payer: Self-pay | Admitting: Cardiology

## 2019-09-15 DIAGNOSIS — I25118 Atherosclerotic heart disease of native coronary artery with other forms of angina pectoris: Secondary | ICD-10-CM

## 2019-09-15 DIAGNOSIS — I2510 Atherosclerotic heart disease of native coronary artery without angina pectoris: Secondary | ICD-10-CM

## 2019-09-15 DIAGNOSIS — I5022 Chronic systolic (congestive) heart failure: Secondary | ICD-10-CM

## 2019-09-15 DIAGNOSIS — I10 Essential (primary) hypertension: Secondary | ICD-10-CM

## 2019-09-15 DIAGNOSIS — E785 Hyperlipidemia, unspecified: Secondary | ICD-10-CM

## 2019-09-15 DIAGNOSIS — E1169 Type 2 diabetes mellitus with other specified complication: Secondary | ICD-10-CM

## 2019-09-15 MED ORDER — ATORVASTATIN CALCIUM 80 MG PO TABS
ORAL_TABLET | ORAL | 3 refills | Status: DC
  Filled 2020-05-03: qty 90, 90d supply, fill #0
  Filled 2020-05-10: qty 30, 30d supply, fill #0
  Filled 2020-05-10: qty 90, 90d supply, fill #0
  Filled 2020-09-12: qty 90, 90d supply, fill #1

## 2019-09-15 MED ORDER — NITROGLYCERIN 0.4 MG SL SUBL: 0.4000 mg | SUBLINGUAL_TABLET | SUBLINGUAL | 5 refills | Status: DC | PRN

## 2019-09-15 MED ORDER — CLOPIDOGREL BISULFATE 75 MG PO TABS: 75.0000 mg | ORAL_TABLET | Freq: Every day | ORAL | 3 refills | Status: DC

## 2019-09-15 MED ORDER — EZETIMIBE 10 MG PO TABS: 10.0000 mg | ORAL_TABLET | Freq: Every day | ORAL | 3 refills | Status: DC

## 2019-09-15 MED ORDER — LISINOPRIL 20 MG PO TABS: 20.0000 mg | ORAL_TABLET | Freq: Every day | ORAL | 3 refills | Status: DC

## 2019-09-15 MED ORDER — CARVEDILOL 6.25 MG PO TABS
ORAL_TABLET | ORAL | 3 refills | Status: DC
  Filled 2020-05-03: qty 180, 90d supply, fill #0
  Filled 2020-05-10: qty 60, 30d supply, fill #0
  Filled 2020-05-10: qty 180, 90d supply, fill #0

## 2019-09-15 MED ORDER — SPIRONOLACTONE 25 MG PO TABS: 12.5000 mg | ORAL_TABLET | Freq: Every day | ORAL | 3 refills | Status: DC

## 2019-09-15 MED FILL — CARVEDILOL 6.25 MG TABLET: 6.25 | 30 days supply | Qty: 60 | Fill #0

## 2019-09-15 MED FILL — EZETIMIBE 10 MG TABS: 10 | 30 days supply | Qty: 30 | Fill #0

## 2019-09-15 MED FILL — NITROGLYCERIN 0.4 MG TAB SL: 0.4 | 10 days supply | Qty: 25 | Fill #0

## 2019-09-15 MED FILL — LISINOPRIL 20 MG TABLET: 20 | 30 days supply | Qty: 30 | Fill #0

## 2019-09-15 MED FILL — ATORVASTATIN 80 MG TABLET: 80 | 30 days supply | Qty: 30 | Fill #0

## 2019-09-15 MED FILL — SPIRONOLACTONE 25 MG TABLET: 25 | 30 days supply | Qty: 15 | Fill #0

## 2019-09-15 MED FILL — CLOPIDOGREL 75 MG TABLET: 75 | 30 days supply | Qty: 30 | Fill #0

## 2019-09-22 MED FILL — NITROGLYCERIN 0.4 MG TAB SL: 0.4 | 30 days supply | Qty: 75 | Fill #0

## 2019-10-28 ENCOUNTER — Other Ambulatory Visit: Payer: Self-pay

## 2019-10-28 ENCOUNTER — Encounter: Payer: Self-pay | Admitting: Internal Medicine

## 2019-10-28 ENCOUNTER — Ambulatory Visit: Payer: Self-pay | Attending: Internal Medicine | Admitting: Internal Medicine

## 2019-10-28 VITALS — BP 120/77 | HR 76 | Resp 16 | Wt 201.8 lb

## 2019-10-28 DIAGNOSIS — I110 Hypertensive heart disease with heart failure: Secondary | ICD-10-CM | POA: Insufficient documentation

## 2019-10-28 DIAGNOSIS — Z7982 Long term (current) use of aspirin: Secondary | ICD-10-CM | POA: Insufficient documentation

## 2019-10-28 DIAGNOSIS — I2510 Atherosclerotic heart disease of native coronary artery without angina pectoris: Secondary | ICD-10-CM

## 2019-10-28 DIAGNOSIS — E785 Hyperlipidemia, unspecified: Secondary | ICD-10-CM | POA: Insufficient documentation

## 2019-10-28 DIAGNOSIS — Z87891 Personal history of nicotine dependence: Secondary | ICD-10-CM | POA: Insufficient documentation

## 2019-10-28 DIAGNOSIS — E669 Obesity, unspecified: Secondary | ICD-10-CM | POA: Insufficient documentation

## 2019-10-28 DIAGNOSIS — Z6831 Body mass index (BMI) 31.0-31.9, adult: Secondary | ICD-10-CM | POA: Insufficient documentation

## 2019-10-28 DIAGNOSIS — Z955 Presence of coronary angioplasty implant and graft: Secondary | ICD-10-CM | POA: Insufficient documentation

## 2019-10-28 DIAGNOSIS — I25110 Atherosclerotic heart disease of native coronary artery with unstable angina pectoris: Secondary | ICD-10-CM | POA: Insufficient documentation

## 2019-10-28 DIAGNOSIS — Z7902 Long term (current) use of antithrombotics/antiplatelets: Secondary | ICD-10-CM | POA: Insufficient documentation

## 2019-10-28 DIAGNOSIS — Z79899 Other long term (current) drug therapy: Secondary | ICD-10-CM | POA: Insufficient documentation

## 2019-10-28 DIAGNOSIS — I10 Essential (primary) hypertension: Secondary | ICD-10-CM

## 2019-10-28 DIAGNOSIS — E1169 Type 2 diabetes mellitus with other specified complication: Secondary | ICD-10-CM

## 2019-10-28 DIAGNOSIS — E119 Type 2 diabetes mellitus without complications: Secondary | ICD-10-CM | POA: Insufficient documentation

## 2019-10-28 DIAGNOSIS — Z2821 Immunization not carried out because of patient refusal: Secondary | ICD-10-CM

## 2019-10-28 DIAGNOSIS — I5022 Chronic systolic (congestive) heart failure: Secondary | ICD-10-CM | POA: Insufficient documentation

## 2019-10-28 LAB — GLUCOSE, POCT (MANUAL RESULT ENTRY): POC Glucose: 137 mg/dl — AB (ref 70–99)

## 2019-10-28 NOTE — Progress Notes (Signed)
Patient ID: Jason Foster, male    DOB: 1960/08/24  MRN: [id number]  CC: Diabetes and Hypertension   Subjective: Wilian Foster is a 59 y.o. male who presents for chronic ds management.  His concerns today include:  Patient with history of ICM with DES x2 (followed by cardiologist Dr. Neita Garnet), HL, HTN, DM, chronic systolic CHF with EF 62-13%, PUD, .   DIABETES TYPE 2 Last A1C:   Results for orders placed or performed in visit on 10/28/19  POCT glucose (manual entry)  Result Value Ref Range   POC Glucose 137 (A) 70 - 99 mg/dl    Lab Results  Component Value Date   HGBA1C 6.3 (H) 07/28/2019  Med Adherence: Diet control. Medication side effects:  '[]'  Yes    '[]'  No Home Monitoring?  '[]'  Yes    '[x]'  No Home glucose results range Diet Adherence: '[x]'  Yes -completely changed his diet.  He has lost about 20 pounds since the beginning of this year. Exercise: '[x]'  Yes -walking daily Hypoglycemic episodes?: '[]'  Yes    '[]'  No Numbness of the feet? '[]'  Yes    '[x]'  No Retinopathy hx? '[]'  Yes    '[]'  No Last eye exam: For eye exam.  Agreeable to referral. Comments:   HYPERTENSION/CAD Currently taking: see medication list.  He saw his cardiologist Dr. Martinique last month. Med Adherence: '[x]'  Yes    '[]'  No Medication side effects: '[]'  Yes    '[x]'  No Adherence with salt restriction: '[x]'  Yes    '[]'  No Home Monitoring?: '[]'  Yes    '[]'  No Monitoring Frequency: '[]'  Yes    '[]'  No Home BP results range: '[]'  Yes    '[]'  No SOB? '[]'  Yes    '[x]'  No Chest Pain?: '[]'  Yes    '[x]'  No Leg swelling?: '[]'  Yes    '[x]'  No Headaches?: '[]'  Yes    '[x]'  No Dizziness? '[]'  Yes    '[x]'  No Comments:   HM:  Declines flu, Pneumovax and COVID-19 vaccines  Patient Active Problem List   Diagnosis Date Noted  . New onset type 2 diabetes mellitus ([place]) 06/28/2019  . 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 Chronic systolic CHF (congestive heart failure) ([place]) 10/13/2014  . Bruising 03/17/2014  . Erosive gastritis   . Duodenal ulcer   .  Duodenitis without hemorrhage   . Anemia   . Hypertension   . CAD (coronary artery disease)   . Ischemic cardiomyopathy   . HLD (hyperlipidemia)   . 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 Absolute anemia   . Melena   . Acute blood loss anemia   . Coronary artery disease involving native coronary artery of native heart with other form of angina pectoris ([place])   . Unstable angina ([place]) 11/14/2013  . ACS (acute coronary syndrome) ([place]) 11/14/2013  . Coronary atherosclerosis of native coronary artery 02/12/2013  . ST elevation myocardial infarction (STEMI) of anterior wall ([place]) 02/10/2013  . Cardiomyopathy, ischemic 02/10/2013  . Antiplatelet or antithrombotic long-term use 02/10/2013  . Hyperlipidemia associated with type 2 diabetes mellitus ([place]) 12/30/2012  . Essential hypertension, benign 11/29/2012     Current Outpatient Medications on File Prior to Visit  Medication Sig Dispense Refill  . aspirin EC 81 MG tablet Take 1 tablet (81 mg total) by mouth daily. 30 tablet 1  . atorvastatin (LIPITOR) 80 MG tablet TAKE 1 TABLET BY MOUTH DAILY AT 6PM TO LOWER CHOLESTEROL 90 tablet 3  . Blood Glucose Monitoring Suppl (TRUE METRIX METER)  w/Device KIT Use as directed 1 kit 0  . carvedilol (COREG) 6.25 MG tablet TAKE 1 TABLET BY MOUTH 2 TIMES DAILY WITH A MEAL. 60 tablet 3  . clopidogrel (PLAVIX) 75 MG tablet Take 1 tablet (75 mg total) by mouth daily. Must have office visit for refills 90 tablet 3  . ezetimibe (ZETIA) 10 MG tablet Take 1 tablet (10 mg total) by mouth daily. 90 tablet 3  . glucose blood (TRUE METRIX BLOOD GLUCOSE TEST) test strip Use as instructed 100 each 12  . lisinopril (ZESTRIL) 20 MG tablet Take 1 tablet (20 mg total) by mouth daily. Must have office visit for refills 90 tablet 3  . methocarbamol (ROBAXIN) 500 MG tablet Take 1 tablet (500 mg total) by mouth 2 (two) times daily. 20 tablet 0  . nitroGLYCERIN (NITROSTAT) 0.4 MG SL tablet Place 1 tablet (0.4 mg total) under the  tongue every 5 (five) minutes as needed for chest pain. 25 tablet 5  . spironolactone (ALDACTONE) 25 MG tablet Take 0.5 tablets (12.5 mg total) by mouth daily. Must have office visit for refills 90 tablet 3  . TRUEplus Lancets 28G MISC Use as directed 100 each 4   No current facility-administered medications on file prior to visit.    No Known Allergies  Social History   Socioeconomic History  . Marital status: Married    Spouse name: Not on file  . Number of children: 3  . Years of education: Not on file  . Highest education level: Not on file  Occupational History  . Occupation: Biomedical engineer: [employer]  Tobacco Use  . Smoking status: Former Smoker    Packs/day: 0.50    Years: 34.00    Pack years: 17.00    Types: Cigarettes    Quit date: 04/13/2012    Years since quitting: 7.5  . Smokeless tobacco: Never Used  Vaping Use  . Vaping Use: Never used  Substance and Sexual Activity  . Alcohol use: No  . Drug use: No  . Sexual activity: Never  Other Topics Concern  . Not on file  Social History Narrative  . Not on file   Social Determinants of Health   Financial Resource Strain:   . Difficulty of Paying Living Expenses: Not on file  Food Insecurity:   . Worried About Charity fundraiser in the Last Year: Not on file  . Ran Out of Food in the Last Year: Not on file  Transportation Needs:   . Lack of Transportation (Medical): Not on file  . Lack of Transportation (Non-Medical): Not on file  Physical Activity:   . Days of Exercise per Week: Not on file  . Minutes of Exercise per Session: Not on file  Stress:   . Feeling of Stress : Not on file  Social Connections:   . Frequency of Communication with Friends and Family: Not on file  . Frequency of Social Gatherings with Friends and Family: Not on file  . Attends Religious Services: Not on file  . Active Member of Clubs or Organizations: Not on file  . Attends Archivist Meetings:  Not on file  . Marital Status: Not on file  Intimate Partner Violence:   . Fear of Current or Ex-Partner: Not on file  . Emotionally Abused: Not on file  . Physically Abused: Not on file  . Sexually Abused: Not on file    Family History  Family history unknown: Yes  Past Surgical History:  Procedure Laterality Date  . CORONARY ANGIOGRAM  02/10/2013   Procedure: CORONARY ANGIOGRAM;  Surgeon: Peter M Martinique, MD;  [facility] C[facility] LAB;  Service: Cardiovascular;;  . CORONARY ANGIOPLASTY WITH STENT PLACEMENT  01/2013  . ESOPHAGOGASTRODUODENOSCOPY N/A 11/16/2013   Procedure: ESOPHAGOGASTRODUODENOSCOPY (EGD);  Surgeon: Jerene Bears, MD;  Location[facility] ENDOSCOPY;  Service: Endoscopy;  Laterality: N/A;  . PERCUTANEOUS CORONARY STENT INTERVENTION (PCI-S)  02/10/2013   Procedure: PERCUTANEOUS CORONARY STENT INTERVENTION (PCI-S);  Surgeon: Peter M Martinique, MD;  Location: [facility] CATH LAB;  Service: Cardiovascular;;  DES x2 Prox LAD to MID LAD  . PILONIDAL CYST EXCISION  ~ 1995    ROS: Review of Systems Negative except as stated above  PHYSICAL EXAM: BP 120/77   Pulse 76   Resp 16   Wt 201 lb 12.8 oz (91.5 kg)   SpO2 95%   BMI 31.61 kg/m   Wt Readings from Last 3 Encounters:  10/28/19 201 lb 12.8 oz (91.5 kg)  09/15/19 204 lb 9.6 oz (92.8 kg)  03/28/19 224 lb (101.6 kg)    Physical Exam  General appearance - alert, well appearing, middle-aged older male and in no distress Mental status - normal mood, behavior, speech, dress, motor activity, and thought processes Neck - supple, no significant adenopathy Chest - clear to auscultation, no wheezes, rales or rhonchi, symmetric air entry Heart - normal rate, regular rhythm, normal S1, S2, no murmurs, rubs, clicks or gallops Extremities - peripheral pulses normal, no pedal edema, no clubbing or cyanosis Diabetic Foot Exam - Simple   Simple Foot Form Visual Inspection See comments: Yes Sensation Testing Intact to touch and monofilament  testing bilaterally: Yes Pulse Check Posterior Tibialis and Dorsalis pulse intact bilaterally: Yes Comments Callous nonulcerative on medical aspect both big toe.     CMP Latest Ref Rng & Units 03/28/2019 02/18/2018 09/24/2017  Glucose 65 - 99 mg/dL 113(H) 112(H) 136(H)  BUN 8 - 27 mg/dL '11 14 16  ' Creatinine 0.76 - 1.27 mg/dL 1.04 0.92 1.04  Sodium 134 - 144 mmol/L 137 137 139  Potassium 3.5 - 5.2 mmol/L 4.8 4.8 4.8  Chloride 96 - 106 mmol/L 102 99 102  CO2 20 - 29 mmol/L '23 22 21  ' Calcium 8.6 - 10.2 mg/dL 9.2 9.3 9.3  Total Protein 6.0 - 8.5 g/dL 7.4 7.7 7.6  Total Bilirubin 0.0 - 1.2 mg/dL 0.4 0.4 0.2  Alkaline Phos 39 - 117 IU/L 101 97 114  AST 0 - 40 IU/L '22 22 20  ' ALT 0 - 44 IU/L '20 23 19   ' Lipid Panel     Component Value Date/Time   CHOL 157 03/28/2019 0941   TRIG 155 (H) 03/28/2019 0941   HDL 32 (L) 03/28/2019 0941   CHOLHDL 4.9 03/28/2019 0941   CHOLHDL 5.8 (H) 01/03/2016 1001   VLDL 35 (H) 01/03/2016 1001   LDLCALC 98 03/28/2019 0941    CBC    Component Value Date/Time   WBC 10.0 03/28/2019 0941   WBC 8.7 07/13/2014 1100   RBC 5.56 03/28/2019 0941   RBC 5.25 07/13/2014 1100   HGB 14.6 03/28/2019 0941   HCT 44.2 03/28/2019 0941   PLT 224 02/18/2018 1204   MCV 80 03/28/2019 0941   MCH 26.3 (L) 03/28/2019 0941   MCH 26.9 07/13/2014 1100   MCHC 33.0 03/28/2019 0941   MCHC 33.3 07/13/2014 1100   RDW 14.1 03/28/2019 0941   LYMPHSABS 3.6 (H) 03/28/2019 0941   MONOABS 0.7  07/13/2014 1100   EOSABS 0.7 (H) 03/28/2019 0941   BASOSABS 0.1 03/28/2019 0941    ASSESSMENT AND PLAN:  1. Type 2 diabetes mellitus with obesity ([place]) Commended him on weight loss and in getting his A1c down.  Encouraged him to continue healthy eating habits and regular exercise. - POCT glucose (manual entry) - Ambulatory referral to Ophthalmology  2. Essential hypertension At goal.  Continue carvedilol, spironolactone and lisinopril.  3. Coronary artery disease involving native  coronary artery of native heart without angina pectoris Followed by cardiology.  He is on carvedilol, dual antiplatelet agent, statin therapy  4. Hyperlipidemia associated with type 2 diabetes mellitus ([facility]) Continue atorvastatin and Zetia  5. Influenza vaccination declined This was offered.  Patient declined  6. 23-polyvalent pneumococcal polysaccharide vaccine declined This was offered.  Patient declined  7. COVID-19 vaccination declined  Patient was given the opportunity to ask questions.  Patient verbalized understanding of the plan and was able to repeat key elements of the plan.   Orders Placed This Encounter  Procedures  . Ambulatory referral to Ophthalmology  . POCT glucose (manual entry)     Requested Prescriptions    No prescriptions requested or ordered in this encounter    Return in about 5 months (around 03/27/2020).  Karle Plumber, MD, FACP

## 2020-01-27 MED FILL — LISINOPRIL 20 MG TABLET: 20 | 90 days supply | Qty: 90 | Fill #1

## 2020-01-27 MED FILL — SPIRONOLACTONE 25 MG TABLET: 25 | 90 days supply | Qty: 45 | Fill #1

## 2020-01-27 MED FILL — CLOPIDOGREL 75 MG TABLET: 75 | 90 days supply | Qty: 90 | Fill #1

## 2020-05-03 ENCOUNTER — Other Ambulatory Visit: Payer: Self-pay

## 2020-05-03 MED FILL — Lisinopril Tab 20 MG: ORAL | 90 days supply | Qty: 90 | Fill #0 | Status: CN

## 2020-05-03 MED FILL — Clopidogrel Bisulfate Tab 75 MG (Base Equiv): ORAL | 30 days supply | Qty: 90 | Fill #0 | Status: CN

## 2020-05-03 MED FILL — Spironolactone Tab 25 MG: ORAL | 90 days supply | Qty: 45 | Fill #0 | Status: CN

## 2020-05-09 ENCOUNTER — Other Ambulatory Visit: Payer: Self-pay

## 2020-05-10 ENCOUNTER — Other Ambulatory Visit: Payer: Self-pay

## 2020-05-10 MED FILL — Spironolactone Tab 25 MG: ORAL | 90 days supply | Qty: 45 | Fill #0 | Status: AC

## 2020-05-10 MED FILL — Clopidogrel Bisulfate Tab 75 MG (Base Equiv): ORAL | 30 days supply | Qty: 30 | Fill #0 | Status: CN

## 2020-05-10 MED FILL — Clopidogrel Bisulfate Tab 75 MG (Base Equiv): ORAL | 90 days supply | Qty: 90 | Fill #0 | Status: AC

## 2020-05-10 MED FILL — Spironolactone Tab 25 MG: ORAL | 30 days supply | Qty: 15 | Fill #0 | Status: CN

## 2020-05-10 MED FILL — Lisinopril Tab 20 MG: ORAL | 90 days supply | Qty: 90 | Fill #0 | Status: AC

## 2020-05-10 MED FILL — Lisinopril Tab 20 MG: ORAL | 30 days supply | Qty: 30 | Fill #0 | Status: CN

## 2020-05-29 DIAGNOSIS — Z20822 Contact with and (suspected) exposure to covid-19: Secondary | ICD-10-CM | POA: Diagnosis not present

## 2020-06-13 ENCOUNTER — Other Ambulatory Visit: Payer: Self-pay

## 2020-09-12 ENCOUNTER — Other Ambulatory Visit: Payer: Self-pay | Admitting: Internal Medicine

## 2020-09-12 ENCOUNTER — Other Ambulatory Visit: Payer: Self-pay

## 2020-09-12 MED FILL — Clopidogrel Bisulfate Tab 75 MG (Base Equiv): ORAL | 90 days supply | Qty: 90 | Fill #1 | Status: AC

## 2020-09-12 MED FILL — Lisinopril Tab 20 MG: ORAL | 90 days supply | Qty: 90 | Fill #1 | Status: AC

## 2020-09-12 MED FILL — Spironolactone Tab 25 MG: ORAL | 90 days supply | Qty: 45 | Fill #1 | Status: AC

## 2020-09-12 NOTE — Telephone Encounter (Signed)
Requested medication (s) are due for refill today - yes  Requested medication (s) are on the active medication list -yes  Future visit scheduled -no  Last refill: 09/15/19 #60 3RF  Notes to clinic: Request RF: medication prescribed by outside provider  Requested Prescriptions  Pending Prescriptions Disp Refills   carvedilol (COREG) 6.25 MG tablet 60 tablet 3    Sig: TAKE 1 TABLET BY MOUTH 2 TIMES DAILY WITH A MEAL.     There is no refill protocol information for this order       Requested Prescriptions  Pending Prescriptions Disp Refills   carvedilol (COREG) 6.25 MG tablet 60 tablet 3    Sig: TAKE 1 TABLET BY MOUTH 2 TIMES DAILY WITH A MEAL.     There is no refill protocol information for this order

## 2020-09-26 ENCOUNTER — Other Ambulatory Visit: Payer: Self-pay

## 2020-12-13 ENCOUNTER — Other Ambulatory Visit: Payer: Self-pay | Admitting: Cardiology

## 2020-12-13 ENCOUNTER — Other Ambulatory Visit: Payer: Self-pay

## 2020-12-13 DIAGNOSIS — I5022 Chronic systolic (congestive) heart failure: Secondary | ICD-10-CM

## 2020-12-13 DIAGNOSIS — E1169 Type 2 diabetes mellitus with other specified complication: Secondary | ICD-10-CM

## 2020-12-13 DIAGNOSIS — E785 Hyperlipidemia, unspecified: Secondary | ICD-10-CM

## 2020-12-13 DIAGNOSIS — I10 Essential (primary) hypertension: Secondary | ICD-10-CM

## 2020-12-13 DIAGNOSIS — I25118 Atherosclerotic heart disease of native coronary artery with other forms of angina pectoris: Secondary | ICD-10-CM

## 2020-12-14 ENCOUNTER — Other Ambulatory Visit: Payer: Self-pay

## 2020-12-14 MED ORDER — SPIRONOLACTONE 25 MG PO TABS
ORAL_TABLET | ORAL | 0 refills | Status: DC
  Filled 2020-12-14: qty 45, 90d supply, fill #0
  Filled 2020-12-21: qty 90, 90d supply, fill #0

## 2020-12-14 MED ORDER — CARVEDILOL 6.25 MG PO TABS
ORAL_TABLET | ORAL | 0 refills | Status: DC
  Filled 2020-12-14 – 2020-12-21 (×2): qty 60, 30d supply, fill #0

## 2020-12-14 MED ORDER — ATORVASTATIN CALCIUM 80 MG PO TABS
ORAL_TABLET | ORAL | 0 refills | Status: DC
  Filled 2020-12-14 – 2020-12-21 (×2): qty 90, 90d supply, fill #0

## 2020-12-14 MED ORDER — CLOPIDOGREL BISULFATE 75 MG PO TABS
ORAL_TABLET | ORAL | 0 refills | Status: DC
  Filled 2020-12-14 – 2020-12-21 (×2): qty 90, 90d supply, fill #0

## 2020-12-14 MED ORDER — LISINOPRIL 20 MG PO TABS
ORAL_TABLET | ORAL | 0 refills | Status: DC
  Filled 2020-12-14 – 2020-12-21 (×2): qty 90, 90d supply, fill #0

## 2020-12-19 ENCOUNTER — Other Ambulatory Visit: Payer: Self-pay

## 2020-12-21 ENCOUNTER — Other Ambulatory Visit: Payer: Self-pay

## 2021-03-18 ENCOUNTER — Other Ambulatory Visit: Payer: Self-pay | Admitting: Cardiology

## 2021-03-18 ENCOUNTER — Other Ambulatory Visit: Payer: Self-pay

## 2021-03-18 DIAGNOSIS — E785 Hyperlipidemia, unspecified: Secondary | ICD-10-CM

## 2021-03-18 DIAGNOSIS — I10 Essential (primary) hypertension: Secondary | ICD-10-CM

## 2021-03-18 DIAGNOSIS — I25118 Atherosclerotic heart disease of native coronary artery with other forms of angina pectoris: Secondary | ICD-10-CM

## 2021-03-18 DIAGNOSIS — I5022 Chronic systolic (congestive) heart failure: Secondary | ICD-10-CM

## 2021-03-18 DIAGNOSIS — E1169 Type 2 diabetes mellitus with other specified complication: Secondary | ICD-10-CM

## 2021-03-18 MED ORDER — LISINOPRIL 20 MG PO TABS
ORAL_TABLET | ORAL | 0 refills | Status: DC
  Filled 2021-03-18: qty 90, 90d supply, fill #0

## 2021-03-18 MED ORDER — ATORVASTATIN CALCIUM 80 MG PO TABS
ORAL_TABLET | ORAL | 0 refills | Status: DC
  Filled 2021-03-18: qty 90, 90d supply, fill #0

## 2021-03-18 MED ORDER — CARVEDILOL 6.25 MG PO TABS
ORAL_TABLET | ORAL | 0 refills | Status: DC
  Filled 2021-03-18: qty 60, 30d supply, fill #0

## 2021-03-18 MED ORDER — SPIRONOLACTONE 25 MG PO TABS
ORAL_TABLET | ORAL | 0 refills | Status: DC
  Filled 2021-03-18: qty 45, 90d supply, fill #0
  Filled 2021-08-21: qty 45, 90d supply, fill #1

## 2021-03-18 MED ORDER — CLOPIDOGREL BISULFATE 75 MG PO TABS
ORAL_TABLET | ORAL | 0 refills | Status: DC
  Filled 2021-03-18: qty 90, 90d supply, fill #0

## 2021-03-19 ENCOUNTER — Other Ambulatory Visit: Payer: Self-pay

## 2021-04-20 ENCOUNTER — Other Ambulatory Visit: Payer: Self-pay

## 2021-04-20 ENCOUNTER — Emergency Department (HOSPITAL_BASED_OUTPATIENT_CLINIC_OR_DEPARTMENT_OTHER)
Admission: EM | Admit: 2021-04-20 | Discharge: 2021-04-20 | Disposition: A | Discharge status: Home / Self Care | Payer: Medicaid Other | Attending: Emergency Medicine | Admitting: Emergency Medicine

## 2021-04-20 DIAGNOSIS — R42 Dizziness and giddiness: Secondary | ICD-10-CM | POA: Insufficient documentation

## 2021-04-20 DIAGNOSIS — Z7902 Long term (current) use of antithrombotics/antiplatelets: Secondary | ICD-10-CM | POA: Insufficient documentation

## 2021-04-20 DIAGNOSIS — Z7982 Long term (current) use of aspirin: Secondary | ICD-10-CM | POA: Insufficient documentation

## 2021-04-20 DIAGNOSIS — R195 Other fecal abnormalities: Secondary | ICD-10-CM | POA: Insufficient documentation

## 2021-04-20 DIAGNOSIS — I10 Essential (primary) hypertension: Secondary | ICD-10-CM | POA: Insufficient documentation

## 2021-04-20 DIAGNOSIS — Z79899 Other long term (current) drug therapy: Secondary | ICD-10-CM | POA: Insufficient documentation

## 2021-04-20 DIAGNOSIS — D72829 Elevated white blood cell count, unspecified: Secondary | ICD-10-CM | POA: Insufficient documentation

## 2021-04-20 DIAGNOSIS — D649 Anemia, unspecified: Secondary | ICD-10-CM | POA: Insufficient documentation

## 2021-04-20 LAB — CBC
HCT: 27.5 % — ABNORMAL LOW (ref 39.0–52.0)
Hemoglobin: 9.1 g/dL — ABNORMAL LOW (ref 13.0–17.0)
MCH: 27.4 pg (ref 26.0–34.0)
MCHC: 33.1 g/dL (ref 30.0–36.0)
MCV: 82.8 fL (ref 80.0–100.0)
Platelets: 201 10*3/uL (ref 150–400)
RBC: 3.32 MIL/uL — ABNORMAL LOW (ref 4.22–5.81)
RDW: 14 % (ref 11.5–15.5)
WBC: 11.5 10*3/uL — ABNORMAL HIGH (ref 4.0–10.5)
nRBC: 0 % (ref 0.0–0.2)

## 2021-04-20 LAB — BASIC METABOLIC PANEL
Anion gap: 7 (ref 5–15)
BUN: 41 mg/dL — ABNORMAL HIGH (ref 8–23)
CO2: 23 mmol/L (ref 22–32)
Calcium: 8 mg/dL — ABNORMAL LOW (ref 8.9–10.3)
Chloride: 103 mmol/L (ref 98–111)
Creatinine, Ser: 1.18 mg/dL (ref 0.61–1.24)
GFR, Estimated: >60 mL/min (ref >60)
Glucose, Bld: 216 mg/dL — ABNORMAL HIGH (ref 70–99)
Potassium: 4.2 mmol/L (ref 3.5–5.1)
Sodium: 133 mmol/L — ABNORMAL LOW (ref 135–145)

## 2021-04-20 LAB — TROPONIN I (HIGH SENSITIVITY)
Troponin I (High Sensitivity): 3 ng/L (ref <18)
Troponin I (High Sensitivity): 3 ng/L (ref <18)

## 2021-04-20 LAB — OCCULT BLOOD X 1 CARD TO LAB, STOOL: Fecal Occult Bld: POSITIVE — AB

## 2021-04-20 LAB — CBG MONITORING, ED: Glucose-Capillary: 206 mg/dL — ABNORMAL HIGH (ref 70–99)

## 2021-04-20 MED ORDER — PANTOPRAZOLE SODIUM 40 MG PO TBEC: 40.0000 mg | DELAYED_RELEASE_TABLET | Freq: Every day | ORAL | 0 refills | Status: DC

## 2021-04-20 MED ORDER — SODIUM CHLORIDE 0.9 % IV BOLUS
1000.0000 mL | Freq: Once | INTRAVENOUS | Status: AC
  Administered 2021-04-20: 1000 mL via INTRAVENOUS

## 2021-04-20 NOTE — Discharge Instructions (Addendum)
You were seen in the emergency department for lightheadedness low blood pressure.  Your blood counts showed you to be anemic and your rectal exam was positive for blood.  I am recommending admission to the hospital for further evaluation.  You are declining admission to the hospital.  Please closely follow-up with your primary care doctor and reconnect with [facility].  Will be important for you to get repeat blood work done soon.  We are starting you on some acid medication.  Please return to the hospital if any worsening or concerning symptoms.  Please stay well-hydrated. ?

## 2021-04-20 NOTE — ED Triage Notes (Addendum)
States yesterday was dizzy & sweating, hypotensive when checking BP at [employer] 99/69. Has been fasting x 2 weeks for muslim religion. States had a fall in bathroom yesterday from the dizziness, denies LOC. ?

## 2021-04-20 NOTE — ED Provider Notes (Signed)
?[facility] ?Provider Note ? ? ?CSN: [id number] ?Arrival date & time: 04/20/21  1307 ? ?  ? ?History ? ?Chief Complaint  ?Patient presents with  ? Dizziness  ? ? ?Jason Foster is a 61 y.o. male.  He has a history of hypertension cardiac disease.  Complaining of feeling dizzy lightheaded started 2 days ago.  Diaphoretic at times especially when standing up.  Has been fasting for Ramadan.  No chest pain or shortness of breath no vomiting.  1 episode of diarrhea.  No fevers or chills.  Has been trying to push fluids with some improvement in symptoms today.  Checked his blood pressure today and found to be in the 90s which is very low for him. ? ?The history is provided by the patient and the spouse.  ?Dizziness ?Quality:  Lightheadedness ?Onset quality:  Gradual ?Duration:  2 days ?Timing:  Intermittent ?Progression:  Unchanged ?Chronicity:  New ?Context comment:  Fasting ?Relieved by:  Nothing ?Worsened by:  Standing up ?Ineffective treatments:  Fluids ?Associated symptoms: diarrhea   ?Associated symptoms: no chest pain, no headaches, no shortness of breath, no syncope, no vision changes and no vomiting   ? ?  ? ?Home Medications ?Prior to Admission medications   ?Medication Sig Start Date End Date Taking? Authorizing Provider  ?aspirin EC 81 MG tablet Take 1 tablet (81 mg total) by mouth daily. 03/28/19   Camillia Herter, NP  ?atorvastatin (LIPITOR) 80 MG tablet TAKE 1 TABLET BY MOUTH DAILY AT 6PM TO LOWER CHOLESTEROL.NEEDS APPOINTMENT WITH WITH DR Martinique FOR FUTURE REFILLS 03/18/21   Martinique, Peter M, MD  ?Blood Glucose Monitoring Suppl (TRUE METRIX METER) w/Device KIT Use as directed 06/28/19   Ladell Pier, MD  ?carvedilol (COREG) 6.25 MG tablet TAKE 1 TABLET BY MOUTH 2 TIMES DAILY WITH A MEAL. 03/18/21   Martinique, Peter M, MD  ?clopidogrel (PLAVIX) 75 MG tablet TAKE 1 TABLET (75 MG TOTAL) BY MOUTH DAILY. MUST HAVE OFFICE VISIT FOR REFILLS 03/18/21 03/18/22  Martinique, Peter M, MD   ?ezetimibe (ZETIA) 10 MG tablet Take 1 tablet (10 mg total) by mouth daily. 09/15/19   Martinique, Peter M, MD  ?glucose blood (TRUE METRIX BLOOD GLUCOSE TEST) test strip Use as instructed 06/28/19   Ladell Pier, MD  ?lisinopril (ZESTRIL) 20 MG tablet TAKE 1 TABLET (20 MG TOTAL) BY MOUTH DAILY. MUST HAVE OFFICE VISIT FOR REFILLS 03/18/21 03/18/22  Martinique, Peter M, MD  ?methocarbamol (ROBAXIN) 500 MG tablet Take 1 tablet (500 mg total) by mouth 2 (two) times daily. 12/26/16   Petrucelli, Samantha R, PA-C  ?nitroGLYCERIN (NITROSTAT) 0.4 MG SL tablet Place 1 tablet (0.4 mg total) under the tongue every 5 (five) minutes as needed for chest pain. 09/15/19   Martinique, Peter M, MD  ?spironolactone (ALDACTONE) 25 MG tablet TAKE 0.5 TABLETS (12.5 MG TOTAL) BY MOUTH DAILY. MUST HAVE OFFICE VISIT FOR REFILLS 03/18/21 03/18/22  Martinique, Peter M, MD  ?TRUEplus Lancets 28G MISC Use as directed 06/28/19   Ladell Pier, MD  ?   ? ?Allergies    ?Patient has no known allergies.   ? ?Review of Systems   ?Review of Systems  ?Constitutional:  Negative for fever.  ?HENT:  Negative for sore throat.   ?Eyes:  Negative for visual disturbance.  ?Respiratory:  Negative for shortness of breath.   ?Cardiovascular:  Negative for chest pain and syncope.  ?Gastrointestinal:  Positive for diarrhea. Negative for abdominal pain and vomiting.  ?  Genitourinary:  Negative for dysuria.  ?Skin:  Negative for rash.  ?Neurological:  Positive for dizziness. Negative for headaches.  ? ?Physical Exam ?Updated Vital Signs ?BP 123/74   Pulse 95   Temp 97.8 ?F (36.6 ?C) (Oral)   Resp (!) 27   Ht '5\' 7"'  (1.702 m)   Wt 89.4 kg   SpO2 98%   BMI 30.85 kg/m?  ?Physical Exam ?Vitals and nursing note reviewed.  ?Constitutional:   ?   General: He is not in acute distress. ?   Appearance: Normal appearance. He is well-developed.  ?HENT:  ?   Head: Normocephalic and atraumatic.  ?Eyes:  ?   Conjunctiva/sclera: Conjunctivae normal.  ?Cardiovascular:  ?   Rate and Rhythm:  Normal rate and regular rhythm.  ?   Heart sounds: No murmur heard. ?Pulmonary:  ?   Effort: Pulmonary effort is normal. No respiratory distress.  ?   Breath sounds: Normal breath sounds.  ?Abdominal:  ?   Palpations: Abdomen is soft.  ?   Tenderness: There is no abdominal tenderness. There is no guarding or rebound.  ?Musculoskeletal:     ?   General: No swelling. Normal range of motion.  ?   Cervical back: Neck supple.  ?Skin: ?   General: Skin is warm and dry.  ?   Capillary Refill: Capillary refill takes less than 2 seconds.  ?Neurological:  ?   General: No focal deficit present.  ?   Mental Status: He is alert.  ?   Sensory: No sensory deficit.  ?   Motor: No weakness.  ?   Gait: Gait normal.  ? ? ?ED Results / Procedures / Treatments   ?Labs ?(all labs ordered are listed, but only abnormal results are displayed) ?Labs Reviewed  ?BASIC METABOLIC PANEL - Abnormal; Notable for the following components:  ?    Result Value  ? Sodium 133 (*)   ? Glucose, Bld 216 (*)   ? BUN 41 (*)   ? Calcium 8.0 (*)   ? All other components within normal limits  ?CBC - Abnormal; Notable for the following components:  ? WBC 11.5 (*)   ? RBC 3.32 (*)   ? Hemoglobin 9.1 (*)   ? HCT 27.5 (*)   ? All other components within normal limits  ?OCCULT BLOOD X 1 CARD TO LAB, STOOL - Abnormal; Notable for the following components:  ? Fecal Occult Bld POSITIVE (*)   ? All other components within normal limits  ?CBG MONITORING, ED - Abnormal; Notable for the following components:  ? Glucose-Capillary 206 (*)   ? All other components within normal limits  ?TROPONIN I (HIGH SENSITIVITY)  ?TROPONIN I (HIGH SENSITIVITY)  ? ? ?EKG ?EKG Interpretation ? ?Date/Time:  Saturday April 20 2021 13:33:54 EDT ?Ventricular Rate:  107 ?PR Interval:  148 ?QRS Duration: 84 ?QT Interval:  316 ?QTC Calculation: 421 ?R Axis:   10 ?Text Interpretation: Sinus tachycardia Inferior infarct , age undetermined Anteroseptal infarct , age undetermined Abnormal ECG When  compared with ECG of 03-Dec-2014 08:17, No significant change since last tracing Confirmed by Aletta Edouard [phone]) on 04/20/2021 3:15:56 PM ? ?Radiology ?No results found. ? ?Procedures ?Procedures  ? ? ?Medications Ordered in ED ?Medications  ?sodium chloride 0.9 % bolus 1,000 mL (0 mLs Intravenous Stopped 04/20/21 1846)  ? ? ?ED Course/ Medical Decision Making/ A&P ?Clinical Course as of 04/21/21 0923  ?Sat Apr 20, 2021  ?1631 Rectal exam done with nurse Robin as chaperone.  Normal tone no masses.  Sample sent to lab for guaiac. [MB]  ?1845 Discussed with Dr. Randel Pigg GI.  He recommends the patient be admitted to the hospital for serial hemoglobins.  I reviewed this with the patient.  He says he feels back to baseline and does not want to be admitted.  We had a long discussion and ultimately he is going to decline admission.  We will start on PPI.  Recommended close follow-up for repeat blood work with his PCP and follow-up GI.  Return instructions discussed [MB]  ?  ?Clinical Course User Index ?[MB] Hayden Rasmussen, MD  ? ?                        ?Medical Decision Making ?Amount and/or Complexity of Data Reviewed ?Labs: ordered. ? ?Risk ?Prescription drug management. ? ? ?This patient complains of dizziness lightheadedness low blood pressure; this involves an extensive number of treatment ?Options and is a complaint that carries with it a high risk of complications and ?morbidity. The differential includes arrhythmia, dehydration, metabolic derangement, anemia, GI bleed ? ?I ordered, reviewed and interpreted labs, which included CBC with elevated white count low hemoglobin, chemistries with elevated BUN elevated glucose, fecal occult positive, troponins flat ?I ordered medication IV fluids and reviewed PMP when indicated. ?Additional history obtained from patient significant other ?Previous records obtained and reviewed in epic, patient did have prior GI bleed and endoscopy ?I consulted Dr. Benson Norway  gastroenterology and discussed lab and imaging findings and discussed disposition.  ?Cardiac monitoring reviewed, tachycardia ?Social determinants considered, no significant barriers ?Critical Interventions: None ? ?After the interventio

## 2021-05-09 ENCOUNTER — Ambulatory Visit: Payer: Medicaid Other | Admitting: Physician Assistant

## 2021-05-15 ENCOUNTER — Inpatient Hospital Stay: Payer: Medicaid Other | Admitting: Physician Assistant

## 2021-05-31 ENCOUNTER — Other Ambulatory Visit: Payer: Self-pay | Admitting: Cardiology

## 2021-05-31 ENCOUNTER — Other Ambulatory Visit: Payer: Self-pay

## 2021-05-31 DIAGNOSIS — I10 Essential (primary) hypertension: Secondary | ICD-10-CM

## 2021-05-31 DIAGNOSIS — I5022 Chronic systolic (congestive) heart failure: Secondary | ICD-10-CM

## 2021-05-31 DIAGNOSIS — E1169 Type 2 diabetes mellitus with other specified complication: Secondary | ICD-10-CM

## 2021-05-31 DIAGNOSIS — E785 Hyperlipidemia, unspecified: Secondary | ICD-10-CM

## 2021-05-31 DIAGNOSIS — I25118 Atherosclerotic heart disease of native coronary artery with other forms of angina pectoris: Secondary | ICD-10-CM

## 2021-05-31 DIAGNOSIS — I2510 Atherosclerotic heart disease of native coronary artery without angina pectoris: Secondary | ICD-10-CM

## 2021-06-03 ENCOUNTER — Other Ambulatory Visit: Payer: Self-pay

## 2021-06-03 MED ORDER — CARVEDILOL 6.25 MG PO TABS
ORAL_TABLET | ORAL | 0 refills | Status: DC
  Filled 2021-06-03: qty 60, 30d supply, fill #0

## 2021-06-03 MED ORDER — CLOPIDOGREL BISULFATE 75 MG PO TABS
75.0000 mg | ORAL_TABLET | Freq: Every day | ORAL | 0 refills | Status: DC
  Filled 2021-06-03: qty 30, 30d supply, fill #0

## 2021-06-03 MED ORDER — NITROGLYCERIN 0.4 MG SL SUBL
0.4000 mg | SUBLINGUAL_TABLET | SUBLINGUAL | 5 refills | Status: DC | PRN
  Filled 2021-06-03: qty 25, 25d supply, fill #0
  Filled 2021-08-21: qty 25, 25d supply, fill #1
  Filled 2022-02-21: qty 25, 25d supply, fill #2

## 2021-06-03 MED ORDER — ATORVASTATIN CALCIUM 80 MG PO TABS
ORAL_TABLET | ORAL | 0 refills | Status: DC
  Filled 2021-06-03: qty 30, fill #0

## 2021-06-03 MED ORDER — LISINOPRIL 20 MG PO TABS
20.0000 mg | ORAL_TABLET | Freq: Every day | ORAL | 0 refills | Status: DC
  Filled 2021-06-03: qty 30, 30d supply, fill #0

## 2021-06-05 ENCOUNTER — Other Ambulatory Visit: Payer: Self-pay

## 2021-08-21 ENCOUNTER — Telehealth: Payer: Self-pay | Admitting: Cardiology

## 2021-08-21 ENCOUNTER — Other Ambulatory Visit: Payer: Self-pay | Admitting: Cardiology

## 2021-08-21 ENCOUNTER — Other Ambulatory Visit: Payer: Self-pay

## 2021-08-21 DIAGNOSIS — I10 Essential (primary) hypertension: Secondary | ICD-10-CM

## 2021-08-21 DIAGNOSIS — I25118 Atherosclerotic heart disease of native coronary artery with other forms of angina pectoris: Secondary | ICD-10-CM

## 2021-08-21 DIAGNOSIS — I2510 Atherosclerotic heart disease of native coronary artery without angina pectoris: Secondary | ICD-10-CM

## 2021-08-21 DIAGNOSIS — E785 Hyperlipidemia, unspecified: Secondary | ICD-10-CM

## 2021-08-21 DIAGNOSIS — E1169 Type 2 diabetes mellitus with other specified complication: Secondary | ICD-10-CM

## 2021-08-21 DIAGNOSIS — I5022 Chronic systolic (congestive) heart failure: Secondary | ICD-10-CM

## 2021-08-21 MED ORDER — ATORVASTATIN CALCIUM 80 MG PO TABS
ORAL_TABLET | ORAL | 0 refills | Status: DC
  Filled 2021-08-21: qty 30, 30d supply, fill #0

## 2021-08-21 MED ORDER — CARVEDILOL 6.25 MG PO TABS
ORAL_TABLET | ORAL | 0 refills | Status: DC
  Filled 2021-08-21: qty 60, 30d supply, fill #0

## 2021-08-21 MED ORDER — LISINOPRIL 20 MG PO TABS
20.0000 mg | ORAL_TABLET | Freq: Every day | ORAL | 0 refills | Status: DC
  Filled 2021-08-21: qty 30, 30d supply, fill #0

## 2021-08-21 NOTE — Telephone Encounter (Signed)
 *  STAT* If patient is at the pharmacy, call can be transferred to refill team.   1. Which medications need to be refilled? (please list name of each medication and dose if known) atorvastatin (LIPITOR) 80 MG tablet carvedilol (COREG) 6.25 MG tablet lisinopril (ZESTRIL) 20 MG tablet  2. Which pharmacy/location (including street and city if local pharmacy) is medication to be sent to? [facility]  3. Do they need a 30 day or 90 day supply? 90 days   Pt said, he will leave out of town and will not come back until the end of October. He needs a 90 days supply refill. He made an appt with Dr. [place] on 11/10

## 2021-08-22 ENCOUNTER — Other Ambulatory Visit: Payer: Self-pay

## 2021-08-22 MED ORDER — LISINOPRIL 20 MG PO TABS
20.0000 mg | ORAL_TABLET | Freq: Every day | ORAL | 0 refills | Status: DC
  Filled 2021-08-22: qty 90, 90d supply, fill #0

## 2021-08-22 MED ORDER — CARVEDILOL 6.25 MG PO TABS
ORAL_TABLET | ORAL | 0 refills | Status: DC
  Filled 2021-08-22 – 2022-02-21 (×2): qty 60, 30d supply, fill #0

## 2021-08-22 MED ORDER — ATORVASTATIN CALCIUM 80 MG PO TABS
ORAL_TABLET | ORAL | 0 refills | Status: DC
  Filled 2021-08-22: qty 90, 90d supply, fill #0

## 2021-08-22 NOTE — Telephone Encounter (Signed)
Pharmacist following up. Atorvastatin is missing instructions and Carvedilol should have been a 90 day supply. Please assist.

## 2021-11-08 NOTE — Progress Notes (Deleted)
[name] Date of Birth: 01-31-1960 Medical Record #[id number]  History of Present Illness: Mr. Carton is seen for follow up CAD. He is s/p anterior STEMI on 02/10/13 treated with DES x 2 of the LAD. ( 3.0x 38 mm Promus and 3.5 x 32 Promus) He has a history of HTN and hyperlipidemia. He is a former smoker. On emergent cardiac cath he had occlusion of the proximal LAD. The RCA was also occluded in the mid vessel but this appeared chronic and was collateralized. EF was 35-40%. In November 2015 he was admitted with an UGI bleed. He was found to have a gastritis/duodenitis and duodenal ulcer. He was treated with PPI and Hgb later normalized.   On follow up today he is feeling very well. Marland Kitchen He denies any chest pain or SOB. No edema or palpitations. He is taking his medication regularly.  He is eating healthier and has lost 20 lbs.  Current Outpatient Medications on File Prior to Visit  Medication Sig Dispense Refill   aspirin EC 81 MG tablet Take 1 tablet (81 mg total) by mouth daily. 30 tablet 1   atorvastatin (LIPITOR) 80 MG tablet Take 1 tablet by mouth daily KEEP UPCOMING APPOINTMENT FOR FUTURE REFILLS. 90 tablet 0   Blood Glucose Monitoring Suppl (TRUE METRIX METER) w/Device KIT Use as directed 1 kit 0   carvedilol (COREG) 6.25 MG tablet TAKE 1 TABLET BY MOUTH 2 TIMES DAILY WITH A MEAL, KEEP UPCOMING APPOINTMENT FOR FUTURE REFILLS 60 tablet 0   clopidogrel (PLAVIX) 75 MG tablet Take 1 tablet (75 mg total) by mouth daily. Schedule an appointment for further refills, FINAL ATTEMPT 30 tablet 0   ezetimibe (ZETIA) 10 MG tablet Take 1 tablet (10 mg total) by mouth daily. 90 tablet 3   glucose blood (TRUE METRIX BLOOD GLUCOSE TEST) test strip Use as instructed 100 each 12   lisinopril (ZESTRIL) 20 MG tablet Take 1 tablet (20 mg total) by mouth daily. Keep upcoming appointment for future refills. 90 tablet 0   methocarbamol (ROBAXIN) 500 MG tablet Take 1 tablet (500 mg total) by mouth 2 (two)  times daily. 20 tablet 0   nitroGLYCERIN (NITROSTAT) 0.4 MG SL tablet Place 1 tablet (0.4 mg total) under the tongue every 5 (five) minutes as needed for chest pain. 25 tablet 5   pantoprazole (PROTONIX) 40 MG tablet Take 1 tablet (40 mg total) by mouth daily. 30 tablet 0   spironolactone (ALDACTONE) 25 MG tablet TAKE 0.5 TABLETS (12.5 MG TOTAL) BY MOUTH DAILY. MUST HAVE OFFICE VISIT FOR REFILLS 90 tablet 0   TRUEplus Lancets 28G MISC Use as directed 100 each 4   No current facility-administered medications on file prior to visit.    No Known Allergies  Past Medical History:  Diagnosis Date   Anemia    a. 11/2013 in setting of ulcer/gastritis.   CAD (coronary artery disease)    a. Ant STEMI (01/2013):  LHC - dLM 20, pLAD 100 (DES x 2), CFX < 20, mRCA 100% (L-R collats)   Chronic systolic CHF (congestive heart failure) ([place]) 10/13/2014   Duodenal ulcer    a. 11/2013 EGD: medium sized, non-bleeding ulcer @ duodenal bulb.   Duodenitis without hemorrhage    a. 11/2013 EGD: erosive duodenitis w/o bleeding.   Erosive gastritis    a. 11/2013 EGD: prepyloric region of stomach and gastric antrum.   HLD (hyperlipidemia)    Hypertension    Ischemic cardiomyopathy    a. echo (01/2013):  mild LVH, EF 35-40%, AS and apical AK, PASP 34    Past Surgical History:  Procedure Laterality Date   CORONARY ANGIOGRAM  02/10/2013   Procedure: CORONARY ANGIOGRAM;  Surgeon: Harley Mccartney M Martinique, MD;  Location: [facility] CATH LAB;  Service: Cardiovascular;;   CORONARY ANGIOPLASTY WITH STENT PLACEMENT  01/2013   ESOPHAGOGASTRODUODENOSCOPY N/A 11/16/2013   Procedure: ESOPHAGOGASTRODUODENOSCOPY (EGD);  Surgeon: Jerene Bears, MD;  Location: [facility] ENDOSCOPY;  Service: Endoscopy;  Laterality: N/A;   PERCUTANEOUS CORONARY STENT INTERVENTION (PCI-S)  02/10/2013   Procedure: PERCUTANEOUS CORONARY STENT INTERVENTION (PCI-S);  Surgeon: Levii Hairfield M Martinique, MD;  Location: [facility] CATH LAB;  Service: Cardiovascular;;  DES x2 Prox LAD to MID LAD    PILONIDAL CYST EXCISION  ~ 1995    Social History   Tobacco Use  Smoking Status Former   Packs/day: 0.50   Years: 34.00   Total pack years: 17.00   Types: Cigarettes   Quit date: 04/13/2012   Years since quitting: 9.5  Smokeless Tobacco Never    Social History   Substance and Sexual Activity  Alcohol Use No    Family History  Family history unknown: Yes    Review of Systems: As noted in HPI.  All other systems were reviewed and are negative.  Physical Exam: There were no vitals taken for this visit. GENERAL:  Well appearing male in NAD HEENT:  PERRL, EOMI, sclera are clear. Oropharynx is clear. NECK:  No jugular venous distention, carotid upstroke brisk and symmetric, no bruits, no thyromegaly or adenopathy LUNGS:  Clear to auscultation bilaterally CHEST:  Unremarkable HEART:  RRR,  PMI not displaced or sustained,S1 and S2 within normal limits, no S3, no S4: no clicks, no rubs, no murmurs ABD:  Soft, nontender. BS +, no masses or bruits. No hepatomegaly, no splenomegaly EXT:  2 + pulses throughout, no edema, no cyanosis no clubbing SKIN:  Warm and dry.  No rashes NEURO:  Alert and oriented x 3. Cranial nerves II through XII intact. PSYCH:  Cognitively intact   LABORATORY DATA: Lab Results  Component Value Date   WBC 11.5 (H) 04/20/2021   HGB 9.1 (L) 04/20/2021   HCT 27.5 (L) 04/20/2021   PLT 201 04/20/2021   GLUCOSE 216 (H) 04/20/2021   CHOL 157 03/28/2019   TRIG 155 (H) 03/28/2019   HDL 32 (L) 03/28/2019   LDLCALC 98 03/28/2019   ALT 20 03/28/2019   AST 22 03/28/2019   NA 133 (L) 04/20/2021   K 4.2 04/20/2021   CL 103 04/20/2021   CREATININE 1.18 04/20/2021   BUN 41 (H) 04/20/2021   CO2 23 04/20/2021   TSH 1.610 12/03/2016   INR 1.26 11/14/2013   HGBA1C 6.3 (H) 07/28/2019    Assessment/plan:   1. CAD s/p anterior MI with stenting of the LAD with DES x 2 in January 2015. Given extensive stenting in the LAD I would prefer long term DAPT.  Will treat  chronic total occlusion of RCA medically.  He is asymptomatic. Continue lifestyle modification with regular aerobic exercise, weight loss and diet.   2. Ischemic cardiomyopathy. EF 35-40% initially with MI. EF improved to normal 2 months later. No evidence of volume overload.  Continue Coreg, lisinopril, aldactone.   3. HTN controlled.  4. Hyperlipidemia on high dose statin and Zetia. LDL is not at goal of <70.  Additional therapy limited by cost. Triglycerides have improved with weight reduction.    Follow up in one year.

## 2021-11-22 ENCOUNTER — Ambulatory Visit: Payer: Medicaid Other | Attending: Cardiology | Admitting: Cardiology

## 2021-11-28 ENCOUNTER — Encounter: Payer: Self-pay | Admitting: Cardiology

## 2021-12-13 DIAGNOSIS — Z419 Encounter for procedure for purposes other than remedying health state, unspecified: Secondary | ICD-10-CM | POA: Diagnosis not present

## 2022-01-13 DIAGNOSIS — Z419 Encounter for procedure for purposes other than remedying health state, unspecified: Secondary | ICD-10-CM | POA: Diagnosis not present

## 2022-02-13 DIAGNOSIS — Z419 Encounter for procedure for purposes other than remedying health state, unspecified: Secondary | ICD-10-CM | POA: Diagnosis not present

## 2022-02-18 ENCOUNTER — Other Ambulatory Visit: Payer: Self-pay | Admitting: Cardiology

## 2022-02-18 ENCOUNTER — Other Ambulatory Visit: Payer: Self-pay

## 2022-02-18 DIAGNOSIS — I25118 Atherosclerotic heart disease of native coronary artery with other forms of angina pectoris: Secondary | ICD-10-CM

## 2022-02-18 DIAGNOSIS — E1169 Type 2 diabetes mellitus with other specified complication: Secondary | ICD-10-CM

## 2022-02-18 DIAGNOSIS — I10 Essential (primary) hypertension: Secondary | ICD-10-CM

## 2022-02-18 DIAGNOSIS — I5022 Chronic systolic (congestive) heart failure: Secondary | ICD-10-CM

## 2022-02-20 ENCOUNTER — Other Ambulatory Visit: Payer: Self-pay

## 2022-02-20 MED ORDER — ATORVASTATIN CALCIUM 80 MG PO TABS
ORAL_TABLET | ORAL | 1 refills | Status: DC
  Filled 2022-02-20: qty 30, 30d supply, fill #0

## 2022-02-20 MED ORDER — SPIRONOLACTONE 25 MG PO TABS
ORAL_TABLET | ORAL | 1 refills | Status: DC
  Filled 2022-02-20: qty 15, 30d supply, fill #0
  Filled 2022-02-21: qty 30, 60d supply, fill #0

## 2022-02-20 MED ORDER — LISINOPRIL 20 MG PO TABS
20.0000 mg | ORAL_TABLET | Freq: Every day | ORAL | 1 refills | Status: DC
  Filled 2022-02-20: qty 30, 30d supply, fill #0

## 2022-02-21 ENCOUNTER — Other Ambulatory Visit: Payer: Self-pay

## 2022-03-14 DIAGNOSIS — Z419 Encounter for procedure for purposes other than remedying health state, unspecified: Secondary | ICD-10-CM | POA: Diagnosis not present

## 2022-03-21 ENCOUNTER — Other Ambulatory Visit: Payer: Self-pay

## 2022-03-21 ENCOUNTER — Encounter: Payer: Self-pay | Admitting: Nurse Practitioner

## 2022-03-21 ENCOUNTER — Ambulatory Visit: Payer: Medicaid Other | Attending: Nurse Practitioner | Admitting: Nurse Practitioner

## 2022-03-21 VITALS — BP 138/84 | HR 93 | Ht 67.0 in | Wt 211.6 lb

## 2022-03-21 DIAGNOSIS — D649 Anemia, unspecified: Secondary | ICD-10-CM

## 2022-03-21 DIAGNOSIS — E785 Hyperlipidemia, unspecified: Secondary | ICD-10-CM

## 2022-03-21 DIAGNOSIS — I2510 Atherosclerotic heart disease of native coronary artery without angina pectoris: Secondary | ICD-10-CM | POA: Diagnosis not present

## 2022-03-21 DIAGNOSIS — I255 Ischemic cardiomyopathy: Secondary | ICD-10-CM | POA: Diagnosis not present

## 2022-03-21 DIAGNOSIS — I10 Essential (primary) hypertension: Secondary | ICD-10-CM

## 2022-03-21 MED ORDER — SPIRONOLACTONE 25 MG PO TABS
12.5000 mg | ORAL_TABLET | Freq: Every day | ORAL | 3 refills | Status: DC
  Filled 2022-03-21: qty 90, fill #0
  Filled 2023-02-12: qty 15, 30d supply, fill #0

## 2022-03-21 MED ORDER — CARVEDILOL 6.25 MG PO TABS
6.2500 mg | ORAL_TABLET | Freq: Two times a day (BID) | ORAL | 3 refills | Status: DC
  Filled 2022-03-21: qty 180, 90d supply, fill #0
  Filled 2022-09-25: qty 180, 90d supply, fill #1
  Filled 2023-02-12: qty 180, 90d supply, fill #2

## 2022-03-21 MED ORDER — ATORVASTATIN CALCIUM 80 MG PO TABS
80.0000 mg | ORAL_TABLET | Freq: Every day | ORAL | 3 refills | Status: DC
  Filled 2022-03-21: qty 90, 90d supply, fill #0
  Filled 2022-09-25: qty 90, 90d supply, fill #1
  Filled 2023-02-12: qty 90, 90d supply, fill #2

## 2022-03-21 MED ORDER — EZETIMIBE 10 MG PO TABS
10.0000 mg | ORAL_TABLET | Freq: Every day | ORAL | 3 refills | Status: DC
  Filled 2022-03-21: qty 90, 90d supply, fill #0
  Filled 2022-09-25: qty 90, 90d supply, fill #1
  Filled 2023-02-12: qty 90, 90d supply, fill #2

## 2022-03-21 MED ORDER — CLOPIDOGREL BISULFATE 75 MG PO TABS
75.0000 mg | ORAL_TABLET | Freq: Every day | ORAL | 3 refills | Status: DC
  Filled 2022-03-21: qty 90, 90d supply, fill #0
  Filled 2022-09-25: qty 90, 90d supply, fill #1
  Filled 2023-02-12: qty 90, 90d supply, fill #2

## 2022-03-21 MED ORDER — NITROGLYCERIN 0.4 MG SL SUBL
0.4000 mg | SUBLINGUAL_TABLET | SUBLINGUAL | 5 refills | Status: AC | PRN
  Filled 2022-03-21: qty 25, 9d supply, fill #0
  Filled 2023-02-12: qty 25, 9d supply, fill #1

## 2022-03-21 MED ORDER — LISINOPRIL 20 MG PO TABS
20.0000 mg | ORAL_TABLET | Freq: Every day | ORAL | 3 refills | Status: DC
  Filled 2022-03-21: qty 90, 90d supply, fill #0
  Filled 2022-09-25: qty 90, 90d supply, fill #1
  Filled 2023-02-12: qty 90, 90d supply, fill #2

## 2022-03-21 NOTE — Patient Instructions (Signed)
Medication Instructions:  Your physician recommends that you continue on your current medications as directed. Please refer to the Current Medication list given to you today.  *If you need a refill on your cardiac medications before your next appointment, please call your pharmacy*   Lab Work: Your physician recommends that you return for lab work in 1-2 weeks Fasting lipid panel, CMET, CBC   If you have labs (blood work) drawn today and your tests are completely normal, you will receive your results only by: [facility] (if you have MyChart) OR A paper copy in the mail If you have any lab test that is abnormal or we need to change your treatment, we will call you to review the results.   Testing/Procedures: NONE ordered at this time of appointment     Follow-Up: At [facility], you and your health needs are our priority.  As part of our continuing mission to provide you with exceptional heart care, we have created designated Provider Care Teams.  These Care Teams include your primary Cardiologist (physician) and Advanced Practice Providers (APPs -  Physician Assistants and Nurse Practitioners) who all work together to provide you with the care you need, when you need it.  We recommend signing up for the patient portal called "MyChart".  Sign up information is provided on this After Visit Summary.  MyChart is used to connect with patients for Virtual Visits (Telemedicine).  Patients are able to view lab/test results, encounter notes, upcoming appointments, etc.  Non-urgent messages can be sent to your provider as well.   To learn more about what you can do with MyChart, go to [email or web address].    Your next appointment:   1 year(s)  Provider:   Peter Martinique, MD     Other Instructions

## 2022-03-21 NOTE — Progress Notes (Signed)
Office Visit    Patient Name: Jason Foster Date of Encounter: 03/21/2022  Primary Care Provider:  Ladell Pier, MD Primary Cardiologist:  Peter Martinique, MD  Chief Complaint    62 year old male with a history of CAD s/p STEMI, DES x 2-mLAD 2015, ICM, hypertension, hyperlipidemia, anemia, and GI bleed in the setting of gastritis/duodenitis/ulcer who presents for follow-up related to CAD.   Past Medical History    Past Medical History:  Diagnosis Date   Anemia    a. 11/2013 in setting of ulcer/gastritis.   CAD (coronary artery disease)    a. Ant STEMI (01/2013):  LHC - dLM 20, pLAD 100 (DES x 2), CFX < 20, mRCA 100% (L-R collats)   Chronic systolic CHF (congestive heart failure) ([place]) 10/13/2014   Duodenal ulcer    a. 11/2013 EGD: medium sized, non-bleeding ulcer @ duodenal bulb.   Duodenitis without hemorrhage    a. 11/2013 EGD: erosive duodenitis w/o bleeding.   Erosive gastritis    a. 11/2013 EGD: prepyloric region of stomach and gastric antrum.   HLD (hyperlipidemia)    Hypertension    Ischemic cardiomyopathy    a. echo (01/2013):  mild LVH, EF 35-40%, AS and apical AK, PASP 34   Past Surgical History:  Procedure Laterality Date   CORONARY ANGIOGRAM  02/10/2013   Procedure: CORONARY ANGIOGRAM;  Surgeon: Peter M Martinique, MD;  Location: [facility] CATH LAB;  Service: Cardiovascular;;   CORONARY ANGIOPLASTY WITH STENT PLACEMENT  01/2013   ESOPHAGOGASTRODUODENOSCOPY N/A 11/16/2013   Procedure: ESOPHAGOGASTRODUODENOSCOPY (EGD);  Surgeon: Jerene Bears, MD;  Location[facility] ENDOSCOPY;  Service: Endoscopy;  Laterality: N/A;   PERCUTANEOUS CORONARY STENT INTERVENTION (PCI-S)  02/10/2013   Procedure: PERCUTANEOUS CORONARY STENT INTERVENTION (PCI-S);  Surgeon: Peter M Martinique, MD;  Location: [facility] CATH LAB;  Service: Cardiovascular;;  DES x2 Prox LAD to MID LAD   PILONIDAL CYST EXCISION  ~ 1995    Allergies  No Known Allergies   Labs/Other Studies Reviewed    The following studies were  reviewed today: Echo 03/2013: Study Conclusions   - Left ventricle: There is mid and basal anterior, mid    anteroseptal and distal septal wall hypokinesis. Overal    LVEF is preserved. The cavity size was normal. Systolic    function was normal. The estimated ejection fraction was    in the range of 55% to 60%. Doppler parameters are    consistent with abnormal left ventricular relaxation    (grade 1 diastolic dysfunction). There was no evidence of    elevated ventricular filling pressure by Doppler    parameters.  - Aortic valve: Trileaflet; mildly thickened, mildly    calcified leaflets. Transvalvular velocity was within the    normal range. There was no stenosis. No regurgitation.  - Aortic root: The aortic root was normal in size.  - Mitral valve: No regurgitation.  - Right ventricle: Systolic function was normal.  - Pulmonic valve: Trivial regurgitation.  - Pulmonary arteries: Systolic pressure was within the    normal range.  - Pericardium, extracardiac: There was no pericardial    effusion.  Impressions:   - Compared to the prior study LVEF has improved and is now    normal.    Recent Labs: 04/20/2021: BUN 41; Creatinine, Ser 1.18; Hemoglobin 9.1; Platelets 201; Potassium 4.2; Sodium 133  Recent Lipid Panel    Component Value Date/Time   CHOL 157 03/28/2019 0941   TRIG 155 (H) 03/28/2019 0941   HDL 32 (  L) 03/28/2019 0941   CHOLHDL 4.9 03/28/2019 0941   CHOLHDL 5.8 (H) 01/03/2016 1001   VLDL 35 (H) 01/03/2016 1001   LDLCALC 98 03/28/2019 0941    History of Present Illness    62 year old male with the above past medical history including CAD s/p STEMI, DES x 2-mLAD 2015, ICM, hypertension, hyperlipidemia, anemia, and GI bleed in the setting of gastritis/duodenitis/ulcer.  He was hospitalized in May 2015 in the setting of anterior STEMI treated with DES x 2-LAD (3.0x 38 mm Promus and 3.5 x 32 Promus).  The RCA was also occluded in the mid vessel but this appeared to  be chronic and was collateralized.  EF was 35 to 40% time.  Repeat echocardiogram 03/2013 showed EF improved to 55 to 60%, G1 DD.  He was hospitalized in November 2015 with an upper GI bleed in the setting of gastritis/duodenitis and duodenal ulcer.  He was last seen in the office on 09/15/2019 and was stable from a cardiac standpoint.  He denies symptoms concerning for angina.  He was evaluated in the emergency room in 04/2021 with dizziness, anemia.  He declined admission at that time.  He presents today for follow-up.  Since his last visit he has done well from a cardiac standpoint. He denies any symptoms concerning for angina.  He denies dyspnea, edema, PND, orthopnea, weight gain.  He has been traveling back and forth from his vacation home in [place].  Overall, he reports feeling well.  Home Medications    Current Outpatient Medications  Medication Sig Dispense Refill   aspirin EC 81 MG tablet Take 1 tablet (81 mg total) by mouth daily. 30 tablet 1   Blood Glucose Monitoring Suppl (TRUE METRIX METER) w/Device KIT Use as directed 1 kit 0   glucose blood (TRUE METRIX BLOOD GLUCOSE TEST) test strip Use as instructed 100 each 12   pantoprazole (PROTONIX) 40 MG tablet Take 1 tablet (40 mg total) by mouth daily. 30 tablet 0   TRUEplus Lancets 28G MISC Use as directed 100 each 4   atorvastatin (LIPITOR) 80 MG tablet Take 1 tablet by mouth daily KEEP UPCOMING APPOINTMENT FOR FUTURE REFILLS. 90 tablet 3   carvedilol (COREG) 6.25 MG tablet TAKE 1 TABLET BY MOUTH 2 TIMES DAILY WITH A MEAL, KEEP UPCOMING APPOINTMENT FOR FUTURE REFILLS 180 tablet 3   clopidogrel (PLAVIX) 75 MG tablet Take 1 tablet (75 mg total) by mouth daily. Schedule an appointment for further refills, FINAL ATTEMPT 90 tablet 3   ezetimibe (ZETIA) 10 MG tablet Take 1 tablet (10 mg total) by mouth daily. 90 tablet 3   lisinopril (ZESTRIL) 20 MG tablet Take 1 tablet (20 mg total) by mouth daily. Keep upcoming appointment for future refills. 90  tablet 3   nitroGLYCERIN (NITROSTAT) 0.4 MG SL tablet Place 1 tablet (0.4 mg total) under the tongue every 5 (five) minutes as needed for chest pain. 25 tablet 5   spironolactone (ALDACTONE) 25 MG tablet TAKE 0.5 TABLETS (12.5 MG TOTAL) BY MOUTH DAILY. MUST HAVE OFFICE VISIT FOR REFILLS 90 tablet 3   No current facility-administered medications for this visit.     Review of Systems    He denies chest pain, palpitations, dyspnea, pnd, orthopnea, n, v, dizziness, syncope, edema, weight gain, or early satiety. All other systems reviewed and are otherwise negative except as noted above.   Physical Exam    VS:  BP 138/84   Pulse 93   Ht '5\' 7"'$  (1.702 m)   Wt  211 lb 9.6 oz (96 kg)   SpO2 98%   BMI 33.14 kg/m  GEN: Well nourished, well developed, in no acute distress. HEENT: normal. Neck: Supple, no JVD, carotid bruits, or masses. Cardiac: RRR, no murmurs, rubs, or gallops. No clubbing, cyanosis, edema.  Radials/DP/PT 2+ and equal bilaterally.  Respiratory:  Respirations regular and unlabored, clear to auscultation bilaterally. GI: Soft, nontender, nondistended, BS + x 4. MS: no deformity or atrophy. Skin: warm and dry, no rash. Neuro:  Strength and sensation are intact. Psych: Normal affect.  Accessory Clinical Findings    ECG personally reviewed by me today -NSR,  93 bpm- no acute changes.   Lab Results  Component Value Date   WBC 11.5 (H) 04/20/2021   HGB 9.1 (L) 04/20/2021   HCT 27.5 (L) 04/20/2021   MCV 82.8 04/20/2021   PLT 201 04/20/2021   Lab Results  Component Value Date   CREATININE 1.18 04/20/2021   BUN 41 (H) 04/20/2021   NA 133 (L) 04/20/2021   K 4.2 04/20/2021   CL 103 04/20/2021   CO2 23 04/20/2021   Lab Results  Component Value Date   ALT 20 03/28/2019   AST 22 03/28/2019   ALKPHOS 101 03/28/2019   BILITOT 0.4 03/28/2019   Lab Results  Component Value Date   CHOL 157 03/28/2019   HDL 32 (L) 03/28/2019   LDLCALC 98 03/28/2019   TRIG 155 (H)  03/28/2019   CHOLHDL 4.9 03/28/2019    Lab Results  Component Value Date   HGBA1C 6.3 (H) 07/28/2019    Assessment & Plan    1. CAD: S/p STEMI, DES x 2-mLAD 2015. Stable with no anginal symptoms. No indication for ischemic evaluation.  Continue aspirin, Plavix, carvedilol, lisinopril, spironolactone, Lipitor, and Zetia.  2. ICM: Recovered EF. Euvolemic and well compensated on exam.  Continue current medications as above.  3. Hypertension: BP well controlled. Continue current antihypertensive regimen.   4. Hyperlipidemia: LDL was 98 in 03/2019.  Will repeat fasting lipid panel, CMET.  Continue Lipitor, Zetia.  5. Anemia: He has a history of GI bleed/duodenal ulcer.  Hemoglobin was 9.1 in 04/2021.  Will repeat CBC.  Continue Protonix.  6. Disposition: Follow-up in 1 year.      Lenna Sciara, NP 03/21/2022, 9:21 AM

## 2022-03-27 ENCOUNTER — Other Ambulatory Visit: Payer: Self-pay

## 2022-03-28 DIAGNOSIS — I2510 Atherosclerotic heart disease of native coronary artery without angina pectoris: Secondary | ICD-10-CM | POA: Diagnosis not present

## 2022-03-28 DIAGNOSIS — E1169 Type 2 diabetes mellitus with other specified complication: Secondary | ICD-10-CM | POA: Diagnosis not present

## 2022-03-28 DIAGNOSIS — D649 Anemia, unspecified: Secondary | ICD-10-CM | POA: Diagnosis not present

## 2022-03-28 DIAGNOSIS — I255 Ischemic cardiomyopathy: Secondary | ICD-10-CM | POA: Diagnosis not present

## 2022-03-28 DIAGNOSIS — I10 Essential (primary) hypertension: Secondary | ICD-10-CM | POA: Diagnosis not present

## 2022-03-28 DIAGNOSIS — I25118 Atherosclerotic heart disease of native coronary artery with other forms of angina pectoris: Secondary | ICD-10-CM | POA: Diagnosis not present

## 2022-03-28 DIAGNOSIS — E785 Hyperlipidemia, unspecified: Secondary | ICD-10-CM | POA: Diagnosis not present

## 2022-03-28 DIAGNOSIS — I5022 Chronic systolic (congestive) heart failure: Secondary | ICD-10-CM | POA: Diagnosis not present

## 2022-03-29 LAB — COMPREHENSIVE METABOLIC PANEL
ALT: 19 IU/L (ref 0–44)
AST: 24 IU/L (ref 0–40)
Albumin/Globulin Ratio: 1.4 (ref 1.2–2.2)
Albumin: 4.5 g/dL (ref 3.9–4.9)
Alkaline Phosphatase: 102 IU/L (ref 44–121)
BUN/Creatinine Ratio: 12 (ref 10–24)
BUN: 13 mg/dL (ref 8–27)
Bilirubin Total: 0.4 mg/dL (ref 0.0–1.2)
CO2: 22 mmol/L (ref 20–29)
Calcium: 9.7 mg/dL (ref 8.6–10.2)
Chloride: 100 mmol/L (ref 96–106)
Creatinine, Ser: 1.09 mg/dL (ref 0.76–1.27)
Globulin, Total: 3.3 g/dL (ref 1.5–4.5)
Glucose: 126 mg/dL — ABNORMAL HIGH (ref 70–99)
Potassium: 5.7 mmol/L — ABNORMAL HIGH (ref 3.5–5.2)
Sodium: 136 mmol/L (ref 134–144)
Total Protein: 7.8 g/dL (ref 6.0–8.5)
eGFR: 76 mL/min/{1.73_m2} (ref >59)

## 2022-03-29 LAB — LIPID PANEL
Chol/HDL Ratio: 6.5 ratio — ABNORMAL HIGH (ref 0.0–5.0)
Cholesterol, Total: 200 mg/dL — ABNORMAL HIGH (ref 100–199)
HDL: 31 mg/dL — ABNORMAL LOW (ref >39)
LDL Chol Calc (NIH): 142 mg/dL — ABNORMAL HIGH (ref 0–99)
Triglycerides: 146 mg/dL (ref 0–149)
VLDL Cholesterol Cal: 27 mg/dL (ref 5–40)

## 2022-03-29 LAB — CBC
Hematocrit: 48.6 % (ref 37.5–51.0)
Hemoglobin: 15.7 g/dL (ref 13.0–17.7)
MCH: 26.2 pg — ABNORMAL LOW (ref 26.6–33.0)
MCHC: 32.3 g/dL (ref 31.5–35.7)
MCV: 81 fL (ref 79–97)
Platelets: 238 10*3/uL (ref 150–450)
RBC: 6 x10E6/uL — ABNORMAL HIGH (ref 4.14–5.80)
RDW: 14.5 % (ref 11.6–15.4)
WBC: 11.8 10*3/uL — ABNORMAL HIGH (ref 3.4–10.8)

## 2022-04-08 ENCOUNTER — Other Ambulatory Visit: Payer: Self-pay

## 2022-04-08 DIAGNOSIS — E7800 Pure hypercholesterolemia, unspecified: Secondary | ICD-10-CM

## 2022-04-14 DIAGNOSIS — Z419 Encounter for procedure for purposes other than remedying health state, unspecified: Secondary | ICD-10-CM | POA: Diagnosis not present

## 2022-05-14 DIAGNOSIS — Z419 Encounter for procedure for purposes other than remedying health state, unspecified: Secondary | ICD-10-CM | POA: Diagnosis not present

## 2022-06-14 DIAGNOSIS — Z419 Encounter for procedure for purposes other than remedying health state, unspecified: Secondary | ICD-10-CM | POA: Diagnosis not present

## 2022-07-14 DIAGNOSIS — Z419 Encounter for procedure for purposes other than remedying health state, unspecified: Secondary | ICD-10-CM | POA: Diagnosis not present

## 2022-08-14 DIAGNOSIS — Z419 Encounter for procedure for purposes other than remedying health state, unspecified: Secondary | ICD-10-CM | POA: Diagnosis not present

## 2022-09-14 DIAGNOSIS — Z419 Encounter for procedure for purposes other than remedying health state, unspecified: Secondary | ICD-10-CM | POA: Diagnosis not present

## 2022-09-25 ENCOUNTER — Other Ambulatory Visit: Payer: Self-pay

## 2022-10-14 DIAGNOSIS — Z419 Encounter for procedure for purposes other than remedying health state, unspecified: Secondary | ICD-10-CM | POA: Diagnosis not present

## 2022-11-14 DIAGNOSIS — Z419 Encounter for procedure for purposes other than remedying health state, unspecified: Secondary | ICD-10-CM | POA: Diagnosis not present

## 2022-12-14 DIAGNOSIS — Z419 Encounter for procedure for purposes other than remedying health state, unspecified: Secondary | ICD-10-CM | POA: Diagnosis not present

## 2023-01-14 DIAGNOSIS — Z419 Encounter for procedure for purposes other than remedying health state, unspecified: Secondary | ICD-10-CM | POA: Diagnosis not present

## 2023-02-12 ENCOUNTER — Other Ambulatory Visit: Payer: Self-pay

## 2023-02-14 DIAGNOSIS — Z419 Encounter for procedure for purposes other than remedying health state, unspecified: Secondary | ICD-10-CM | POA: Diagnosis not present

## 2023-03-14 DIAGNOSIS — Z419 Encounter for procedure for purposes other than remedying health state, unspecified: Secondary | ICD-10-CM | POA: Diagnosis not present

## 2023-04-25 DIAGNOSIS — Z419 Encounter for procedure for purposes other than remedying health state, unspecified: Secondary | ICD-10-CM | POA: Diagnosis not present

## 2023-05-18 ENCOUNTER — Other Ambulatory Visit: Payer: Self-pay | Admitting: Nurse Practitioner

## 2023-05-18 ENCOUNTER — Other Ambulatory Visit: Payer: Self-pay

## 2023-05-18 DIAGNOSIS — E785 Hyperlipidemia, unspecified: Secondary | ICD-10-CM

## 2023-05-18 DIAGNOSIS — I10 Essential (primary) hypertension: Secondary | ICD-10-CM

## 2023-05-18 DIAGNOSIS — I2510 Atherosclerotic heart disease of native coronary artery without angina pectoris: Secondary | ICD-10-CM

## 2023-05-19 ENCOUNTER — Other Ambulatory Visit: Payer: Self-pay

## 2023-05-19 MED ORDER — CARVEDILOL 6.25 MG PO TABS
6.2500 mg | ORAL_TABLET | Freq: Two times a day (BID) | ORAL | 3 refills | Status: AC
  Filled 2023-05-19: qty 180, 90d supply, fill #0

## 2023-05-19 MED ORDER — CLOPIDOGREL BISULFATE 75 MG PO TABS
75.0000 mg | ORAL_TABLET | Freq: Every day | ORAL | 3 refills | Status: AC
  Filled 2023-05-19: qty 90, 90d supply, fill #0

## 2023-05-19 MED ORDER — ATORVASTATIN CALCIUM 80 MG PO TABS
80.0000 mg | ORAL_TABLET | Freq: Every day | ORAL | 3 refills | Status: AC
  Filled 2023-05-19: qty 90, 90d supply, fill #0

## 2023-05-19 MED ORDER — SPIRONOLACTONE 25 MG PO TABS
12.5000 mg | ORAL_TABLET | Freq: Every day | ORAL | 3 refills | Status: AC
  Filled 2023-05-19: qty 45, 90d supply, fill #0

## 2023-05-19 MED ORDER — LISINOPRIL 20 MG PO TABS
20.0000 mg | ORAL_TABLET | Freq: Every day | ORAL | 3 refills | Status: AC
  Filled 2023-05-19: qty 90, 90d supply, fill #0

## 2023-05-19 MED ORDER — EZETIMIBE 10 MG PO TABS
10.0000 mg | ORAL_TABLET | Freq: Every day | ORAL | 3 refills | Status: AC
  Filled 2023-05-19: qty 90, 90d supply, fill #0

## 2023-05-25 DIAGNOSIS — Z419 Encounter for procedure for purposes other than remedying health state, unspecified: Secondary | ICD-10-CM | POA: Diagnosis not present

## 2023-08-25 ENCOUNTER — Ambulatory Visit: Attending: Cardiology | Admitting: Cardiology

## 2023-08-25 ENCOUNTER — Encounter: Payer: Self-pay | Admitting: Cardiology

## 2023-08-25 VITALS — BP 120/86 | HR 83 | Ht 67.0 in | Wt 212.6 lb

## 2023-08-25 DIAGNOSIS — I2510 Atherosclerotic heart disease of native coronary artery without angina pectoris: Secondary | ICD-10-CM | POA: Diagnosis not present

## 2023-08-25 DIAGNOSIS — E7800 Pure hypercholesterolemia, unspecified: Secondary | ICD-10-CM | POA: Insufficient documentation

## 2023-08-25 DIAGNOSIS — I10 Essential (primary) hypertension: Secondary | ICD-10-CM | POA: Insufficient documentation

## 2023-08-25 NOTE — Progress Notes (Deleted)
 Jason Foster Date of Birth: May 27, 1960 Medical Record #[id number]  History of Present Illness: Jason Foster is seen for follow up CAD. He is s/p anterior STEMI on 02/10/13 treated with DES x 2 of the LAD. ( 3.0x 38 mm Promus and 3.5 x 32 Promus) He has a history of HTN and hyperlipidemia. He is a former smoker. On emergent cardiac cath he had occlusion of the proximal LAD. The RCA was also occluded in the mid vessel but this appeared chronic and was collateralized. EF was 35-40%. In November 2015 he was admitted with an UGI bleed. He was found to have a gastritis/duodenitis and duodenal ulcer. He was treated with PPI and Hgb later normalized.   On follow up today he is feeling very well. Jason Foster He denies any chest pain or SOB. No edema or palpitations. He is taking his medication regularly.  He is eating healthier and has lost 20 lbs.  Current Outpatient Medications on File Prior to Visit  Medication Sig Dispense Refill   aspirin  EC 81 MG tablet Take 1 tablet (81 mg total) by mouth daily. 30 tablet 1   atorvastatin  (LIPITOR ) 80 MG tablet Take 1 tablet (80 mg total) by mouth daily.Pt must schedule an overdue followup appt with Cardiology for any more refills. [phone] 1st attempt Thank You 90 tablet 3   Blood Glucose Monitoring Suppl (TRUE METRIX METER) w/Device KIT Use as directed 1 kit 0   carvedilol  (COREG ) 6.25 MG tablet Take 1 tablet (6.25 mg total) by mouth 2 (two) times daily with a meal.Pt must schedule an overdue followup appt with Cardiology for any more refills. [phone] 1st attempt Thank You 180 tablet 3   clopidogrel  (PLAVIX ) 75 MG tablet Take 1 tablet (75 mg total) by mouth daily.Pt must schedule an overdue followup appt with Cardiology for any more refills. [phone] 1st attempt Thank You 90 tablet 3   ezetimibe  (ZETIA ) 10 MG tablet Take 1 tablet (10 mg total) by mouth daily.Pt must schedule an overdue followup appt with Cardiology for any more refills. [phone] 1st attempt  Thank You 90 tablet 3   glucose blood (TRUE METRIX BLOOD GLUCOSE TEST) test strip Use as instructed 100 each 12   lisinopril  (ZESTRIL ) 20 MG tablet Take 1 tablet (20 mg total) by mouth daily.Pt must schedule an overdue followup appt with Cardiology for any more refills. [phone] 1st attempt Thank You 90 tablet 3   nitroGLYCERIN  (NITROSTAT ) 0.4 MG SL tablet Place 1 tablet (0.4 mg total) under the tongue every 5 (five) minutes as needed for chest pain. 25 tablet 5   pantoprazole  (PROTONIX ) 40 MG tablet Take 1 tablet (40 mg total) by mouth daily. 30 tablet 0   spironolactone  (ALDACTONE ) 25 MG tablet Take 0.5 tablets (12.5 mg total) by mouth daily.Pt must schedule an overdue followup appt with Cardiology for any more refills. [phone] 1st attempt Thank You 90 tablet 3   TRUEplus Lancets 28G MISC Use as directed 100 each 4   No current facility-administered medications on file prior to visit.    No Known Allergies  Past Medical History:  Diagnosis Date   Anemia    a. 11/2013 in setting of ulcer/gastritis.   CAD (coronary artery disease)    a. Ant STEMI (01/2013):  LHC - dLM 20, pLAD 100 (DES x 2), CFX < 20, mRCA 100% (L-R collats)   Chronic systolic CHF (congestive heart failure) ([facility]) 10/13/2014   Duodenal ulcer    a. 11/2013 EGD: medium sized, non-bleeding ulcer @  duodenal bulb.   Duodenitis without hemorrhage    a. 11/2013 EGD: erosive duodenitis w/o bleeding.   Erosive gastritis    a. 11/2013 EGD: prepyloric region of stomach and gastric antrum.   HLD (hyperlipidemia)    Hypertension    Ischemic cardiomyopathy    a. echo (01/2013):  mild LVH, EF 35-40%, AS and apical AK, PASP 34    Past Surgical History:  Procedure Laterality Date   CORONARY ANGIOGRAM  02/10/2013   Procedure: CORONARY ANGIOGRAM;  Surgeon: Hoa Briggs M [place], MD;  Location: [facility] CATH LAB;  Service: Cardiovascular;;   CORONARY ANGIOPLASTY WITH STENT PLACEMENT  01/2013   ESOPHAGOGASTRODUODENOSCOPY N/A 11/16/2013    Procedure: ESOPHAGOGASTRODUODENOSCOPY (EGD);  Surgeon: Gordy CHRISTELLA Starch, MD;  Location: [facility] ENDOSCOPY;  Service: Endoscopy;  Laterality: N/A;   PERCUTANEOUS CORONARY STENT INTERVENTION (PCI-S)  02/10/2013   Procedure: PERCUTANEOUS CORONARY STENT INTERVENTION (PCI-S);  Surgeon: Archie Atilano M [place], MD;  Location: [facility] CATH LAB;  Service: Cardiovascular;;  DES x2 Prox LAD to MID LAD   PILONIDAL CYST EXCISION  ~ 1995    Social History   Tobacco Use  Smoking Status Former   Current packs/day: 0.00   Average packs/day: 0.5 packs/day for 34.0 years (17.0 ttl pk-yrs)   Types: Cigarettes   Start date: 04/14/1978   Quit date: 04/13/2012   Years since quitting: 11.3  Smokeless Tobacco Never    Social History   Substance and Sexual Activity  Alcohol Use No    Family History  Family history unknown: Yes    Review of Systems: As noted in HPI.  All other systems were reviewed and are negative.  Physical Exam: There were no vitals taken for this visit. GENERAL:  Well appearing male in NAD HEENT:  PERRL, EOMI, sclera are clear. Oropharynx is clear. NECK:  No jugular venous distention, carotid upstroke brisk and symmetric, no bruits, no thyromegaly or adenopathy LUNGS:  Clear to auscultation bilaterally CHEST:  Unremarkable HEART:  RRR,  PMI not displaced or sustained,S1 and S2 within normal limits, no S3, no S4: no clicks, no rubs, no murmurs ABD:  Soft, nontender. BS +, no masses or bruits. No hepatomegaly, no splenomegaly EXT:  2 + pulses throughout, no edema, no cyanosis no clubbing SKIN:  Warm and dry.  No rashes NEURO:  Alert and oriented x 3. Cranial nerves II through XII intact. PSYCH:  Cognitively intact   LABORATORY DATA: Lab Results  Component Value Date   WBC 11.8 (H) 03/28/2022   HGB 15.7 03/28/2022   HCT 48.6 03/28/2022   PLT 238 03/28/2022   GLUCOSE 126 (H) 03/28/2022   CHOL 200 (H) 03/28/2022   TRIG 146 03/28/2022   HDL 31 (L) 03/28/2022   LDLCALC 142 (H) 03/28/2022   ALT 19  03/28/2022   AST 24 03/28/2022   NA 136 03/28/2022   K 5.7 (H) 03/28/2022   CL 100 03/28/2022   CREATININE 1.09 03/28/2022   BUN 13 03/28/2022   CO2 22 03/28/2022   TSH 1.610 12/03/2016   INR 1.26 11/14/2013   HGBA1C 6.3 (H) 07/28/2019    Assessment/plan:   1. CAD s/p anterior MI with stenting of the LAD with DES x 2 in January 2015. Given extensive stenting in the LAD I would prefer long term DAPT.  Will treat chronic total occlusion of RCA medically.  He is asymptomatic. Continue lifestyle modification with regular aerobic exercise, weight loss and diet.   2. Ischemic cardiomyopathy. EF 35-40% initially with MI. EF improved to normal  2 months later. No evidence of volume overload.  Continue Coreg , lisinopril , aldactone .   3. HTN controlled.  4. Hyperlipidemia on high dose statin and Zetia . LDL is not at goal of <70.  Additional therapy limited by cost. Triglycerides have improved with weight reduction.    Follow up in one year.

## 2023-08-25 NOTE — Patient Instructions (Signed)
 Medication Instructions:  Continue same medications *If you need a refill on your cardiac medications before your next appointment, please call your pharmacy*  Lab Work: None ordered  Testing/Procedures: None ordered  Follow-Up: At [facility], you and your health needs are our priority.  As part of our continuing mission to provide you with exceptional heart care, our providers are all part of one team.  This team includes your primary Cardiologist (physician) and Advanced Practice Providers or APPs (Physician Assistants and Nurse Practitioners) who all work together to provide you with the care you need, when you need it.  Your next appointment:  1 year   Call in April to schedule August appointment     Provider:  Dr.Jordan   We recommend signing up for the patient portal called MyChart.  Sign up information is provided on this After Visit Summary.  MyChart is used to connect with patients for Virtual Visits (Telemedicine).  Patients are able to view lab/test results, encounter notes, upcoming appointments, etc.  Non-urgent messages can be sent to your provider as well.   To learn more about what you can do with MyChart, go to [email or web address].

## 2023-08-25 NOTE — Progress Notes (Signed)
 Jason Foster Date of Birth: [birth year] Medical Record #[id number]  History of Present Illness: Jason Foster is seen for follow up CAD. He is s/p anterior STEMI on 02/10/13 treated with DES x 2 of the LAD. ( 3.0x 38 mm Promus and 3.5 x 32 Promus) He has a history of HTN and hyperlipidemia. He is a former smoker. On emergent cardiac cath he had occlusion of the proximal LAD. The RCA was also occluded in the mid vessel but this appeared chronic and was collateralized. EF was 35-40%. Repeat Echo 2 months later showed normal LV function.   In November 2015 he was admitted with an UGI bleed. He was found to have a gastritis/duodenitis and duodenal ulcer. He was treated with PPI and Hgb later normalized.   On follow up today he is feeling very well. Jason Foster He denies any chest pain or SOB. No edema or palpitations. He admits he is not taking his medication regularly. Just returned from a trip to [place].  Has 3 grown children in the states.   Current Outpatient Medications on File Prior to Visit  Medication Sig Dispense Refill   atorvastatin  (LIPITOR ) 80 MG tablet Take 1 tablet (80 mg total) by mouth daily.Pt must schedule an overdue followup appt with Cardiology for any more refills. [phone] 1st attempt Thank You 90 tablet 3   Blood Glucose Monitoring Suppl (TRUE METRIX METER) w/Device KIT Use as directed 1 kit 0   carvedilol  (COREG ) 6.25 MG tablet Take 1 tablet (6.25 mg total) by mouth 2 (two) times daily with a meal.Pt must schedule an overdue followup appt with Cardiology for any more refills. [phone] 1st attempt Thank You 180 tablet 3   clopidogrel  (PLAVIX ) 75 MG tablet Take 1 tablet (75 mg total) by mouth daily.Pt must schedule an overdue followup appt with Cardiology for any more refills. [phone] 1st attempt Thank You 90 tablet 3   ezetimibe  (ZETIA ) 10 MG tablet Take 1 tablet (10 mg total) by mouth daily.Pt must schedule an overdue followup appt with Cardiology for any more refills.  [phone] 1st attempt Thank You 90 tablet 3   glucose blood (TRUE METRIX BLOOD GLUCOSE TEST) test strip Use as instructed 100 each 12   lisinopril  (ZESTRIL ) 20 MG tablet Take 1 tablet (20 mg total) by mouth daily.Pt must schedule an overdue followup appt with Cardiology for any more refills. [phone] 1st attempt Thank You 90 tablet 3   nitroGLYCERIN  (NITROSTAT ) 0.4 MG SL tablet Place 1 tablet (0.4 mg total) under the tongue every 5 (five) minutes as needed for chest pain. 25 tablet 5   spironolactone  (ALDACTONE ) 25 MG tablet Take 0.5 tablets (12.5 mg total) by mouth daily.Pt must schedule an overdue followup appt with Cardiology for any more refills. [phone] 1st attempt Thank You 90 tablet 3   TRUEplus Lancets 28G MISC Use as directed 100 each 4   No current facility-administered medications on file prior to visit.    No Known Allergies  Past Medical History:  Diagnosis Date   Anemia    a. 11/2013 in setting of ulcer/gastritis.   CAD (coronary artery disease)    a. Ant STEMI (01/2013):  LHC - dLM 20, pLAD 100 (DES x 2), CFX < 20, mRCA 100% (L-R collats)   Chronic systolic CHF (congestive heart failure) (HCC) 10/13/2014   Duodenal ulcer    a. 11/2013 EGD: medium sized, non-bleeding ulcer @ duodenal bulb.   Duodenitis without hemorrhage    a. 11/2013 EGD: erosive duodenitis w/o bleeding.  Erosive gastritis    a. 11/2013 EGD: prepyloric region of stomach and gastric antrum.   HLD (hyperlipidemia)    Hypertension    Ischemic cardiomyopathy    a. echo (01/2013):  mild LVH, EF 35-40%, AS and apical AK, PASP 34    Past Surgical History:  Procedure Laterality Date   CORONARY ANGIOGRAM  02/10/2013   Procedure: CORONARY ANGIOGRAM;  Surgeon: Avrey Hyser M [place], MD;  Location[facility] CATH LAB;  Service: Cardiovascular;;   CORONARY ANGIOPLASTY WITH STENT PLACEMENT  01/2013   ESOPHAGOGASTRODUODENOSCOPY N/A 11/16/2013   Procedure: ESOPHAGOGASTRODUODENOSCOPY (EGD);  Surgeon: Gordy CHRISTELLA Starch, MD;   Location[facility] ENDOSCOPY;  Service: Endoscopy;  Laterality: N/A;   PERCUTANEOUS CORONARY STENT INTERVENTION (PCI-S)  02/10/2013   Procedure: PERCUTANEOUS CORONARY STENT INTERVENTION (PCI-S);  Surgeon: Codie Krogh M [place], MD;  Location: [facility] CATH LAB;  Service: Cardiovascular;;  DES x2 Prox LAD to MID LAD   PILONIDAL CYST EXCISION  ~ 1995    Social History   Tobacco Use  Smoking Status Former   Current packs/day: 0.00   Average packs/day: 0.5 packs/day for 34.0 years (17.0 ttl pk-yrs)   Types: Cigarettes   Start date: 04/14/1978   Quit date: 04/13/2012   Years since quitting: 11.3  Smokeless Tobacco Never    Social History   Substance and Sexual Activity  Alcohol Use No    Family History  Family history unknown: Yes    Review of Systems: As noted in HPI.  All other systems were reviewed and are negative.  Physical Exam: BP 120/86 (BP Location: Left Arm, Patient Position: Sitting, Cuff Size: Normal)   Pulse 83   Ht 5' 7 (1.702 m)   Wt 212 lb 9.6 oz (96.4 kg)   SpO2 95%   BMI 33.30 kg/m  GENERAL:  Well appearing male in NAD HEENT:  PERRL, EOMI, sclera are clear. Oropharynx is clear. NECK:  No jugular venous distention, carotid upstroke brisk and symmetric, no bruits, no thyromegaly or adenopathy LUNGS:  Clear to auscultation bilaterally CHEST:  Unremarkable HEART:  RRR,  PMI not displaced or sustained,S1 and S2 within normal limits, no S3, no S4: no clicks, no rubs, no murmurs ABD:  Soft, nontender. BS +, no masses or bruits. No hepatomegaly, no splenomegaly EXT:  2 + pulses throughout, no edema, no cyanosis no clubbing SKIN:  Warm and dry.  No rashes NEURO:  Alert and oriented x 3. Cranial nerves II through XII intact. PSYCH:  Cognitively intact   LABORATORY DATA: Lab Results  Component Value Date   WBC 11.8 (H) 03/28/2022   HGB 15.7 03/28/2022   HCT 48.6 03/28/2022   PLT 238 03/28/2022   GLUCOSE 126 (H) 03/28/2022   CHOL 200 (H) 03/28/2022   TRIG 146 03/28/2022   HDL  31 (L) 03/28/2022   LDLCALC 142 (H) 03/28/2022   ALT 19 03/28/2022   AST 24 03/28/2022   NA 136 03/28/2022   K 5.7 (H) 03/28/2022   CL 100 03/28/2022   CREATININE 1.09 03/28/2022   BUN 13 03/28/2022   CO2 22 03/28/2022   TSH 1.610 12/03/2016   INR 1.26 11/14/2013   HGBA1C 6.3 (H) 07/28/2019   EKG Interpretation Date/Time:  Tuesday August 25 2023 14:25:23 EDT Ventricular Rate:  83 PR Interval:  174 QRS Duration:  90 QT Interval:  354 QTC Calculation: 415 R Axis:   -20  Text Interpretation: Normal sinus rhythm Inferior infarct (cited on or before 20-Apr-2021) Anteroseptal infarct (cited on or before 20-Apr-2021) When compared  with ECG of 20-Apr-2021 13:33,  No significant change was found  Confirmed by [place], Cassandr Cederberg [phone]) on 08/25/2023 2:27:51 PM   Assessment/plan:   1. CAD s/p anterior MI with stenting of the LAD with DES x 2 in January 2015. CTO of the RCA. Now on Plavix  monotherapy.   He is asymptomatic. Continue lifestyle modification with regular aerobic exercise, weight loss and diet. Encouraged him to take his medication as prescribed.   2. Ischemic cardiomyopathy. EF 35-40% initially with MI. EF improved to normal 2 months later. No evidence of volume overload.  Continue Coreg , lisinopril , aldactone .   3. HTN controlled.  4. Hyperlipidemia on high dose statin and Zetia . LDL is not at goal of <70.  Additional therapy limited by cost. He is not taking his medication regularly - stressed importance of secondary prevention   Follow up in one year.

## 2023-09-07 ENCOUNTER — Ambulatory Visit: Admitting: Cardiology

## 2023-11-13 ENCOUNTER — Emergency Department (HOSPITAL_BASED_OUTPATIENT_CLINIC_OR_DEPARTMENT_OTHER)
Admission: EM | Admit: 2023-11-13 | Discharge: 2023-11-13 | Disposition: A | Discharge status: Home / Self Care | Attending: Emergency Medicine | Admitting: Emergency Medicine

## 2023-11-13 ENCOUNTER — Encounter (HOSPITAL_BASED_OUTPATIENT_CLINIC_OR_DEPARTMENT_OTHER): Payer: Self-pay

## 2023-11-13 ENCOUNTER — Other Ambulatory Visit: Payer: Self-pay

## 2023-11-13 DIAGNOSIS — L02411 Cutaneous abscess of right axilla: Secondary | ICD-10-CM | POA: Insufficient documentation

## 2023-11-13 DIAGNOSIS — M7989 Other specified soft tissue disorders: Secondary | ICD-10-CM | POA: Diagnosis present

## 2023-11-13 MED ORDER — OXYCODONE-ACETAMINOPHEN 5-325 MG PO TABS
1.0000 | ORAL_TABLET | Freq: Once | ORAL | Status: AC
  Administered 2023-11-13: 1 via ORAL
  Filled 2023-11-13: qty 1

## 2023-11-13 MED ORDER — LIDOCAINE-EPINEPHRINE (PF) 2 %-1:200000 IJ SOLN
10.0000 mL | Freq: Once | INTRAMUSCULAR | Status: AC
  Administered 2023-11-13: 10 mL
  Filled 2023-11-13: qty 20

## 2023-11-13 MED ORDER — OXYCODONE-ACETAMINOPHEN 5-325 MG PO TABS: 1.0000 | ORAL_TABLET | Freq: Four times a day (QID) | ORAL | 0 refills | Status: AC | PRN

## 2023-11-13 MED ORDER — CEPHALEXIN 500 MG PO CAPS: 500.0000 mg | ORAL_CAPSULE | Freq: Four times a day (QID) | ORAL | 0 refills | Status: AC

## 2023-11-13 NOTE — Discharge Instructions (Addendum)
 As we discussed, the packing will need to be removed in 2-3 days, and the infection rechecked by your doctor. Take Percocet for pain, and Keflex for infection.   If you develop a fever, have uncontrolled or severe pain, or new concern, return to the ED.

## 2023-11-13 NOTE — ED Triage Notes (Signed)
 Abscess right axilla area over a week. Area increasing in size and redness

## 2023-11-13 NOTE — ED Provider Notes (Signed)
 Jason Foster Provider Note   CSN: [id number] Arrival date & time: 11/13/23  1625     Patient presents with: Abscess   Jason Foster is a 63 y.o. male.   Patient to ED with painful swelling in the right axilla for the past week. No injury. No fever. No history of abscess.   The history is provided by the patient. No language interpreter was used.  Abscess      Prior to Admission medications   Medication Sig Start Date End Date Taking? Authorizing Provider  cephALEXin (KEFLEX) 500 MG capsule Take 1 capsule (500 mg total) by mouth 4 (four) times daily. 11/13/23  Yes Morena Mckissack, Margit, PA-C  oxyCODONE -acetaminophen  (PERCOCET/ROXICET) 5-325 MG tablet Take 1 tablet by mouth every 6 (six) hours as needed for severe pain (pain score 7-10). 11/13/23  Yes Dez Stauffer, Margit, PA-C  atorvastatin  (LIPITOR ) 80 MG tablet Take 1 tablet (80 mg total) by mouth daily.Pt must schedule an overdue followup appt with Cardiology for any more refills. [phone] 1st attempt Thank You 05/19/23   Jordan, Peter M, MD  Blood Glucose Monitoring Suppl (TRUE METRIX METER) w/Device KIT Use as directed 06/28/19   Vicci Barnie NOVAK, MD  carvedilol  (COREG ) 6.25 MG tablet Take 1 tablet (6.25 mg total) by mouth 2 (two) times daily with a meal.Pt must schedule an overdue followup appt with Cardiology for any more refills. [phone] 1st attempt Thank You 05/19/23   Jordan, Peter M, MD  clopidogrel  (PLAVIX ) 75 MG tablet Take 1 tablet (75 mg total) by mouth daily.Pt must schedule an overdue followup appt with Cardiology for any more refills. [phone] 1st attempt Thank You 05/19/23   Jordan, Peter M, MD  ezetimibe  (ZETIA ) 10 MG tablet Take 1 tablet (10 mg total) by mouth daily.Pt must schedule an overdue followup appt with Cardiology for any more refills. [phone] 1st attempt Thank You 05/19/23   Jordan, Peter M, MD  glucose blood (TRUE METRIX BLOOD GLUCOSE TEST) test strip Use as  instructed 06/28/19   Vicci Barnie NOVAK, MD  lisinopril  (ZESTRIL ) 20 MG tablet Take 1 tablet (20 mg total) by mouth daily.Pt must schedule an overdue followup appt with Cardiology for any more refills. [phone] 1st attempt Thank You 05/19/23   Jordan, Peter M, MD  nitroGLYCERIN  (NITROSTAT ) 0.4 MG SL tablet Place 1 tablet (0.4 mg total) under the tongue every 5 (five) minutes as needed for chest pain. 03/21/22   Daneen Damien BROCKS, NP  spironolactone  (ALDACTONE ) 25 MG tablet Take 0.5 tablets (12.5 mg total) by mouth daily.Pt must schedule an overdue followup appt with Cardiology for any more refills. [phone] 1st attempt Thank You 05/19/23   Jordan, Peter M, MD  TRUEplus Lancets 28G MISC Use as directed 06/28/19   Vicci Barnie NOVAK, MD    Allergies: Patient has no known allergies.    Review of Systems  Updated Vital Signs BP (!) 161/104 (BP Location: Left Arm)   Pulse (!) 103   Temp 98.4 F (36.9 C) (Oral)   Resp 20   SpO2 97%   Physical Exam Vitals and nursing note reviewed.  Constitutional:      Appearance: He is well-developed.  Pulmonary:     Effort: Pulmonary effort is normal.  Musculoskeletal:        General: Normal range of motion.     Cervical back: Normal range of motion.  Skin:    General: Skin is warm and dry.     Comments: Right axilla  has a large, fluctuant area c/w cutaneous abscess with surrounding erythema without induration. No active drainage.   Neurological:     Mental Status: He is alert and oriented to person, place, and time.     (all labs ordered are listed, but only abnormal results are displayed) Labs Reviewed - No data to display  EKG: None  Radiology: No results found.   .Incision and Drainage  Date/Time: 11/13/2023 5:09 PM  Performed by: Odell Balls, PA-C Authorized by: Odell Balls, PA-C   Consent:    Consent obtained:  Verbal   Consent given by:  Patient Universal protocol:    Procedure explained and questions answered to patient  or proxy's satisfaction: yes     Patient identity confirmed:  Verbally with patient Location:    Type:  Abscess   Location:  Upper extremity   Upper extremity location: right axilla. Pre-procedure details:    Skin preparation:  Povidone-iodine Sedation:    Sedation type:  None Anesthesia:    Anesthesia method:  Local infiltration   Local anesthetic:  Lidocaine  2% WITH epi Procedure type:    Complexity:  Simple Procedure details:    Incision types:  Single straight   Wound management:  Probed and deloculated and irrigated with saline   Drainage:  Bloody and purulent   Drainage amount:  Copious   Wound treatment:  Wound left open   Packing materials:  1/4 in iodoform gauze Post-procedure details:    Procedure completion:  Tolerated    Medications Ordered in the ED  oxyCODONE -acetaminophen  (PERCOCET/ROXICET) 5-325 MG per tablet 1 tablet (1 tablet Oral Given 11/13/23 1653)  lidocaine -EPINEPHrine (XYLOCAINE  W/EPI) 2 %-1:200000 (PF) injection 10 mL (10 mLs Infiltration Given 11/13/23 1654)    Clinical Course as of 11/13/23 1748  Fri Nov 13, 2023  1742 Patient with right axillary abscess for several days. No fever. I&D performed with poor patient tolerance despite multiple injections with lidocaine  with epi. Iodoform packing introduced. Will cover with antibiotics, provide pain relief. Care instructions discussed.  [SU]    Clinical Course User Index [SU] Odell Balls, PA-C                                 Medical Decision Making Risk Prescription drug management.        Final diagnoses:  Abscess of axilla, right    ED Discharge Orders          Ordered    oxyCODONE -acetaminophen  (PERCOCET/ROXICET) 5-325 MG tablet  Every 6 hours PRN        11/13/23 1747    cephALEXin (KEFLEX) 500 MG capsule  4 times daily        11/13/23 1747               Odell Balls, PA-C 11/13/23 1748    Yolande Lamar BROCKS, MD 11/14/23 [phone]

## 2023-11-16 ENCOUNTER — Emergency Department (HOSPITAL_BASED_OUTPATIENT_CLINIC_OR_DEPARTMENT_OTHER)
Admission: EM | Admit: 2023-11-16 | Discharge: 2023-11-16 | Disposition: A | Discharge status: Home / Self Care | Attending: Emergency Medicine | Admitting: Emergency Medicine

## 2023-11-16 ENCOUNTER — Other Ambulatory Visit: Payer: Self-pay

## 2023-11-16 DIAGNOSIS — I110 Hypertensive heart disease with heart failure: Secondary | ICD-10-CM | POA: Diagnosis not present

## 2023-11-16 DIAGNOSIS — Z5189 Encounter for other specified aftercare: Secondary | ICD-10-CM

## 2023-11-16 DIAGNOSIS — L02411 Cutaneous abscess of right axilla: Secondary | ICD-10-CM | POA: Insufficient documentation

## 2023-11-16 DIAGNOSIS — I2510 Atherosclerotic heart disease of native coronary artery without angina pectoris: Secondary | ICD-10-CM | POA: Insufficient documentation

## 2023-11-16 DIAGNOSIS — I5022 Chronic systolic (congestive) heart failure: Secondary | ICD-10-CM | POA: Insufficient documentation

## 2023-11-16 DIAGNOSIS — Z4801 Encounter for change or removal of surgical wound dressing: Secondary | ICD-10-CM | POA: Diagnosis present

## 2023-11-16 NOTE — ED Notes (Signed)
 Discharge instructions reviewed with patient. Patient verbalizes understanding, no further questions at this time. Medications and follow up information provided. No acute distress noted at time of departure.

## 2023-11-16 NOTE — ED Triage Notes (Addendum)
 Had abscess drained to right axilla on 10/31. Here for packing removal. Denies signs of infection.  States hasn't been taking antihypertensives

## 2023-11-16 NOTE — ED Provider Notes (Signed)
 Emergency Department Provider Note   I have reviewed the triage vital signs and the nursing notes.   HISTORY  Chief Complaint Wound Check   HPI Jason Foster is a 63 y.o. male presents to the emergency department for evaluation of abscess to the right axilla.  He had incision and drainage on Friday with packing placed.  He states he is feeling well.  No fevers.  He is got some minimal drainage.  The size and pain of the abscess have both decreased significantly.  He is here for packing removal.   Past Medical History:  Diagnosis Date   Anemia    a. 11/2013 in setting of ulcer/gastritis.   CAD (coronary artery disease)    a. Ant STEMI (01/2013):  LHC - dLM 20, pLAD 100 (DES x 2), CFX < 20, mRCA 100% (L-R collats)   Chronic systolic CHF (congestive heart failure) (HCC) 10/13/2014   Duodenal ulcer    a. 11/2013 EGD: medium sized, non-bleeding ulcer @ duodenal bulb.   Duodenitis without hemorrhage    a. 11/2013 EGD: erosive duodenitis w/o bleeding.   Erosive gastritis    a. 11/2013 EGD: prepyloric region of stomach and gastric antrum.   HLD (hyperlipidemia)    Hypertension    Ischemic cardiomyopathy    a. echo (01/2013):  mild LVH, EF 35-40%, AS and apical AK, PASP 34    Review of Systems  Constitutional: No fever/chills  Cardiovascular: Denies chest pain. Respiratory: Denies shortness of breath. Gastrointestinal: No abdominal pain.  No nausea, no vomiting.   Skin: Right axilla abscess (resolving).   ____________________________________________   PHYSICAL EXAM:  VITAL SIGNS: ED Triage Vitals  Encounter Vitals Group     BP 11/16/23 0713 (!) 170/101     Pulse Rate 11/16/23 0713 88     Resp 11/16/23 0713 16     Temp 11/16/23 0713 98.1 F (36.7 C)     Temp Source 11/16/23 0713 Oral     SpO2 11/16/23 0713 99 %     Weight 11/16/23 0712 207 lb (93.9 kg)     Height 11/16/23 0712 5' 7 (1.702 m)   Constitutional: Alert and oriented. Well appearing and in no acute  distress. Eyes: Conjunctivae are normal.  Head: Atraumatic. Nose: No congestion/rhinnorhea. Mouth/Throat: Mucous membranes are moist.   Neck: No stridor.   Cardiovascular: Good peripheral circulation. Respiratory: Normal respiratory effort.   Gastrointestinal: No distention.  Musculoskeletal: No gross deformities of extremities. Neurologic:  Normal speech and language.  Skin:  Skin is warm, dry.  I remove the dressing from the right axilla and the packing material came off on the dressing.  I do not appreciate any residual packing material in the wound which remains open.  There is minimal erythema.  No active drainage.  No bleeding.  ____________________________________________   PROCEDURES  Procedure(s) performed:   Procedures  None  ____________________________________________   INITIAL IMPRESSION / ASSESSMENT AND PLAN / ED COURSE  Pertinent labs & imaging results that were available during my care of the patient were reviewed by me and considered in my medical decision making (see chart for details).   This patient is Presenting for Evaluation of wound evaluation, which does require a range of treatment options, and is a complaint that involves a moderate risk of morbidity and mortality.  The Differential Diagnoses abscess, cellulitis, fascitis, etc.   Medical Decision Making: Summary:  Wound is well-appearing.  Packing removed.  Patient to continue antibiotics and basic wound care at home.  This area does not require repeat packing.  Patient's presentation is most consistent with acute, uncomplicated illness.   Disposition: discharge  ____________________________________________  FINAL CLINICAL IMPRESSION(S) / ED DIAGNOSES  Final diagnoses:  Visit for wound check    Note:  This document was prepared using Dragon voice recognition software and may include unintentional dictation errors.  Fonda Law, MD, [facility] Emergency Medicine    Damaris Abeln, Fonda MATSU,  MD 11/16/23 [phone]

## 2023-11-16 NOTE — Discharge Instructions (Signed)
 Your wound is looking well and is healing properly.  Please continue your antibiotics.  Return with any new or suddenly worsening symptoms.
